# Patient Record
Sex: Female | Born: 1935 | Race: White | Hispanic: No | Smoking: Never smoker
Health system: Southern US, Community
[De-identification: ages and names within clinical notes are randomized; demographics above are authoritative.]

## PROBLEM LIST (undated history)

## (undated) DIAGNOSIS — K219 Gastro-esophageal reflux disease without esophagitis: Secondary | ICD-10-CM

## (undated) DIAGNOSIS — K922 Gastrointestinal hemorrhage, unspecified: Secondary | ICD-10-CM

## (undated) DIAGNOSIS — I10 Essential (primary) hypertension: Secondary | ICD-10-CM

## (undated) DIAGNOSIS — I471 Supraventricular tachycardia, unspecified: Secondary | ICD-10-CM

## (undated) DIAGNOSIS — N189 Chronic kidney disease, unspecified: Secondary | ICD-10-CM

## (undated) DIAGNOSIS — I509 Heart failure, unspecified: Secondary | ICD-10-CM

## (undated) DIAGNOSIS — I4891 Unspecified atrial fibrillation: Secondary | ICD-10-CM

## (undated) DIAGNOSIS — H3530 Unspecified macular degeneration: Secondary | ICD-10-CM

## (undated) DIAGNOSIS — D649 Anemia, unspecified: Secondary | ICD-10-CM

## (undated) DIAGNOSIS — E7800 Pure hypercholesterolemia, unspecified: Secondary | ICD-10-CM

## (undated) DIAGNOSIS — I499 Cardiac arrhythmia, unspecified: Secondary | ICD-10-CM

## (undated) DIAGNOSIS — E871 Hypo-osmolality and hyponatremia: Secondary | ICD-10-CM

## (undated) HISTORY — DX: Cardiac arrhythmia, unspecified: I49.9

## (undated) HISTORY — PX: VAGINAL HYSTERECTOMY: SHX2639

## (undated) HISTORY — PX: REPLACEMENT TOTAL KNEE BILATERAL: SUR1225

## (undated) HISTORY — DX: Supraventricular tachycardia, unspecified: I47.10

## (undated) HISTORY — DX: Anemia, unspecified: D64.9

## (undated) HISTORY — DX: Hypo-osmolality and hyponatremia: E87.1

## (undated) HISTORY — DX: Unspecified macular degeneration: H35.30

## (undated) HISTORY — DX: Supraventricular tachycardia: I47.1

## (undated) HISTORY — PX: ABDOMINAL HYSTERECTOMY: SHX81

## (undated) HISTORY — DX: Gastrointestinal hemorrhage, unspecified: K92.2

## (undated) HISTORY — PX: JOINT REPLACEMENT: SHX530

## (undated) HISTORY — DX: Gastro-esophageal reflux disease without esophagitis: K21.9

---

## 1999-01-26 ENCOUNTER — Ambulatory Visit (HOSPITAL_COMMUNITY): Admission: RE | Admit: 1999-01-26 | Discharge: 1999-01-26 | Payer: Self-pay | Admitting: Obstetrics & Gynecology

## 2004-09-28 ENCOUNTER — Ambulatory Visit: Payer: Self-pay | Admitting: Pain Medicine

## 2004-10-04 ENCOUNTER — Ambulatory Visit: Payer: Self-pay | Admitting: Pain Medicine

## 2004-11-03 ENCOUNTER — Other Ambulatory Visit: Payer: Self-pay

## 2004-11-03 ENCOUNTER — Emergency Department: Payer: Self-pay | Admitting: General Practice

## 2004-11-04 ENCOUNTER — Ambulatory Visit: Payer: Self-pay | Admitting: Pain Medicine

## 2004-11-10 ENCOUNTER — Ambulatory Visit: Payer: Self-pay | Admitting: Pain Medicine

## 2004-12-01 ENCOUNTER — Emergency Department: Payer: Self-pay | Admitting: Emergency Medicine

## 2004-12-16 ENCOUNTER — Ambulatory Visit: Payer: Self-pay | Admitting: Pain Medicine

## 2004-12-29 ENCOUNTER — Ambulatory Visit: Payer: Self-pay | Admitting: Pain Medicine

## 2005-01-27 ENCOUNTER — Ambulatory Visit: Payer: Self-pay | Admitting: Pain Medicine

## 2005-04-06 ENCOUNTER — Ambulatory Visit: Payer: Self-pay | Admitting: Pain Medicine

## 2005-05-19 ENCOUNTER — Ambulatory Visit: Payer: Self-pay | Admitting: Pain Medicine

## 2005-05-30 ENCOUNTER — Ambulatory Visit: Payer: Self-pay | Admitting: Pain Medicine

## 2005-06-16 ENCOUNTER — Ambulatory Visit: Payer: Self-pay | Admitting: Internal Medicine

## 2005-07-14 ENCOUNTER — Ambulatory Visit: Payer: Self-pay | Admitting: Pain Medicine

## 2005-08-10 ENCOUNTER — Ambulatory Visit: Payer: Self-pay | Admitting: Pain Medicine

## 2005-09-13 ENCOUNTER — Other Ambulatory Visit: Payer: Self-pay

## 2005-09-20 ENCOUNTER — Inpatient Hospital Stay: Payer: Self-pay | Admitting: Unknown Physician Specialty

## 2005-09-24 ENCOUNTER — Other Ambulatory Visit: Payer: Self-pay

## 2005-12-29 ENCOUNTER — Ambulatory Visit: Payer: Self-pay | Admitting: Pain Medicine

## 2006-01-02 ENCOUNTER — Ambulatory Visit: Payer: Self-pay | Admitting: Pain Medicine

## 2006-02-02 ENCOUNTER — Ambulatory Visit: Payer: Self-pay | Admitting: Pain Medicine

## 2006-02-13 ENCOUNTER — Ambulatory Visit: Payer: Self-pay | Admitting: Pain Medicine

## 2006-03-03 ENCOUNTER — Ambulatory Visit: Payer: Self-pay | Admitting: Internal Medicine

## 2006-03-21 ENCOUNTER — Ambulatory Visit: Payer: Self-pay | Admitting: Pain Medicine

## 2006-03-29 ENCOUNTER — Ambulatory Visit: Payer: Self-pay | Admitting: Gastroenterology

## 2006-04-14 ENCOUNTER — Ambulatory Visit: Payer: Self-pay | Admitting: Internal Medicine

## 2006-04-21 ENCOUNTER — Ambulatory Visit: Payer: Self-pay | Admitting: Internal Medicine

## 2006-05-09 ENCOUNTER — Ambulatory Visit: Payer: Self-pay | Admitting: Pain Medicine

## 2006-05-17 ENCOUNTER — Ambulatory Visit: Payer: Self-pay | Admitting: Pain Medicine

## 2006-05-26 ENCOUNTER — Ambulatory Visit: Payer: Self-pay | Admitting: Internal Medicine

## 2006-05-30 ENCOUNTER — Inpatient Hospital Stay: Payer: Self-pay | Admitting: Unknown Physician Specialty

## 2006-05-30 ENCOUNTER — Other Ambulatory Visit: Payer: Self-pay

## 2006-05-31 ENCOUNTER — Other Ambulatory Visit: Payer: Self-pay

## 2006-06-21 ENCOUNTER — Ambulatory Visit: Payer: Self-pay | Admitting: Internal Medicine

## 2006-09-04 ENCOUNTER — Ambulatory Visit: Payer: Self-pay | Admitting: Internal Medicine

## 2006-09-22 ENCOUNTER — Ambulatory Visit: Payer: Self-pay | Admitting: Internal Medicine

## 2006-10-21 ENCOUNTER — Ambulatory Visit: Payer: Self-pay | Admitting: Internal Medicine

## 2008-01-03 ENCOUNTER — Ambulatory Visit: Payer: Self-pay | Admitting: Internal Medicine

## 2009-02-16 ENCOUNTER — Ambulatory Visit: Payer: Self-pay | Admitting: Internal Medicine

## 2011-03-28 ENCOUNTER — Ambulatory Visit: Payer: Self-pay | Admitting: Internal Medicine

## 2011-08-21 ENCOUNTER — Inpatient Hospital Stay: Payer: Self-pay | Admitting: Specialist

## 2011-12-05 DIAGNOSIS — R609 Edema, unspecified: Secondary | ICD-10-CM | POA: Diagnosis not present

## 2011-12-05 DIAGNOSIS — R269 Unspecified abnormalities of gait and mobility: Secondary | ICD-10-CM | POA: Diagnosis not present

## 2011-12-05 DIAGNOSIS — I10 Essential (primary) hypertension: Secondary | ICD-10-CM | POA: Diagnosis not present

## 2011-12-29 DIAGNOSIS — H353290 Exudative age-related macular degeneration, unspecified eye, stage unspecified: Secondary | ICD-10-CM | POA: Diagnosis not present

## 2012-01-03 DIAGNOSIS — Z79899 Other long term (current) drug therapy: Secondary | ICD-10-CM | POA: Diagnosis not present

## 2012-01-10 DIAGNOSIS — R609 Edema, unspecified: Secondary | ICD-10-CM | POA: Diagnosis not present

## 2012-01-10 DIAGNOSIS — R262 Difficulty in walking, not elsewhere classified: Secondary | ICD-10-CM | POA: Diagnosis not present

## 2012-01-10 DIAGNOSIS — M129 Arthropathy, unspecified: Secondary | ICD-10-CM | POA: Diagnosis not present

## 2012-01-10 DIAGNOSIS — I10 Essential (primary) hypertension: Secondary | ICD-10-CM | POA: Diagnosis not present

## 2012-02-09 DIAGNOSIS — H2510 Age-related nuclear cataract, unspecified eye: Secondary | ICD-10-CM | POA: Diagnosis not present

## 2012-02-28 DIAGNOSIS — H353290 Exudative age-related macular degeneration, unspecified eye, stage unspecified: Secondary | ICD-10-CM | POA: Diagnosis not present

## 2012-03-08 ENCOUNTER — Ambulatory Visit: Payer: Self-pay | Admitting: Ophthalmology

## 2012-03-08 DIAGNOSIS — R0609 Other forms of dyspnea: Secondary | ICD-10-CM | POA: Diagnosis not present

## 2012-03-08 DIAGNOSIS — Z01812 Encounter for preprocedural laboratory examination: Secondary | ICD-10-CM | POA: Diagnosis not present

## 2012-03-08 DIAGNOSIS — I209 Angina pectoris, unspecified: Secondary | ICD-10-CM | POA: Diagnosis not present

## 2012-03-08 DIAGNOSIS — I10 Essential (primary) hypertension: Secondary | ICD-10-CM | POA: Diagnosis not present

## 2012-03-08 DIAGNOSIS — Z01810 Encounter for preprocedural cardiovascular examination: Secondary | ICD-10-CM | POA: Diagnosis not present

## 2012-03-08 DIAGNOSIS — R0989 Other specified symptoms and signs involving the circulatory and respiratory systems: Secondary | ICD-10-CM | POA: Diagnosis not present

## 2012-03-08 DIAGNOSIS — H2510 Age-related nuclear cataract, unspecified eye: Secondary | ICD-10-CM | POA: Diagnosis not present

## 2012-03-08 LAB — HEMOGLOBIN: HGB: 10.9 g/dL — ABNORMAL LOW (ref 12.0–16.0)

## 2012-03-12 DIAGNOSIS — D649 Anemia, unspecified: Secondary | ICD-10-CM | POA: Diagnosis not present

## 2012-03-20 ENCOUNTER — Ambulatory Visit: Payer: Self-pay | Admitting: Ophthalmology

## 2012-03-20 DIAGNOSIS — D649 Anemia, unspecified: Secondary | ICD-10-CM | POA: Diagnosis not present

## 2012-03-20 DIAGNOSIS — H269 Unspecified cataract: Secondary | ICD-10-CM | POA: Diagnosis not present

## 2012-03-20 DIAGNOSIS — I498 Other specified cardiac arrhythmias: Secondary | ICD-10-CM | POA: Diagnosis not present

## 2012-03-20 DIAGNOSIS — I10 Essential (primary) hypertension: Secondary | ICD-10-CM | POA: Diagnosis not present

## 2012-03-20 DIAGNOSIS — Z7982 Long term (current) use of aspirin: Secondary | ICD-10-CM | POA: Diagnosis not present

## 2012-03-20 DIAGNOSIS — E079 Disorder of thyroid, unspecified: Secondary | ICD-10-CM | POA: Diagnosis not present

## 2012-03-20 DIAGNOSIS — M069 Rheumatoid arthritis, unspecified: Secondary | ICD-10-CM | POA: Diagnosis not present

## 2012-03-20 DIAGNOSIS — H2510 Age-related nuclear cataract, unspecified eye: Secondary | ICD-10-CM | POA: Diagnosis not present

## 2012-03-20 DIAGNOSIS — M4120 Other idiopathic scoliosis, site unspecified: Secondary | ICD-10-CM | POA: Diagnosis not present

## 2012-03-20 DIAGNOSIS — M810 Age-related osteoporosis without current pathological fracture: Secondary | ICD-10-CM | POA: Diagnosis not present

## 2012-03-20 DIAGNOSIS — M549 Dorsalgia, unspecified: Secondary | ICD-10-CM | POA: Diagnosis not present

## 2012-03-20 DIAGNOSIS — R0609 Other forms of dyspnea: Secondary | ICD-10-CM | POA: Diagnosis not present

## 2012-03-20 DIAGNOSIS — K219 Gastro-esophageal reflux disease without esophagitis: Secondary | ICD-10-CM | POA: Diagnosis not present

## 2012-03-20 DIAGNOSIS — Z79899 Other long term (current) drug therapy: Secondary | ICD-10-CM | POA: Diagnosis not present

## 2012-03-20 DIAGNOSIS — Z96659 Presence of unspecified artificial knee joint: Secondary | ICD-10-CM | POA: Diagnosis not present

## 2012-03-20 DIAGNOSIS — H9190 Unspecified hearing loss, unspecified ear: Secondary | ICD-10-CM | POA: Diagnosis not present

## 2012-03-20 DIAGNOSIS — I209 Angina pectoris, unspecified: Secondary | ICD-10-CM | POA: Diagnosis not present

## 2012-05-07 DIAGNOSIS — H353290 Exudative age-related macular degeneration, unspecified eye, stage unspecified: Secondary | ICD-10-CM | POA: Diagnosis not present

## 2012-05-10 ENCOUNTER — Ambulatory Visit: Payer: Self-pay | Admitting: Ophthalmology

## 2012-05-10 DIAGNOSIS — Z01812 Encounter for preprocedural laboratory examination: Secondary | ICD-10-CM | POA: Diagnosis not present

## 2012-05-10 DIAGNOSIS — D649 Anemia, unspecified: Secondary | ICD-10-CM | POA: Diagnosis not present

## 2012-05-10 DIAGNOSIS — Z79899 Other long term (current) drug therapy: Secondary | ICD-10-CM | POA: Diagnosis not present

## 2012-05-10 DIAGNOSIS — H2510 Age-related nuclear cataract, unspecified eye: Secondary | ICD-10-CM | POA: Diagnosis not present

## 2012-05-10 LAB — HEMOGLOBIN: HGB: 11.3 g/dL — ABNORMAL LOW (ref 12.0–16.0)

## 2012-05-10 LAB — POTASSIUM: Potassium: 4 mmol/L (ref 3.5–5.1)

## 2012-05-29 ENCOUNTER — Ambulatory Visit: Payer: Self-pay | Admitting: Ophthalmology

## 2012-05-29 DIAGNOSIS — M069 Rheumatoid arthritis, unspecified: Secondary | ICD-10-CM | POA: Diagnosis not present

## 2012-05-29 DIAGNOSIS — M4120 Other idiopathic scoliosis, site unspecified: Secondary | ICD-10-CM | POA: Diagnosis not present

## 2012-05-29 DIAGNOSIS — I10 Essential (primary) hypertension: Secondary | ICD-10-CM | POA: Diagnosis not present

## 2012-05-29 DIAGNOSIS — M810 Age-related osteoporosis without current pathological fracture: Secondary | ICD-10-CM | POA: Diagnosis not present

## 2012-05-29 DIAGNOSIS — Z882 Allergy status to sulfonamides status: Secondary | ICD-10-CM | POA: Diagnosis not present

## 2012-05-29 DIAGNOSIS — H9190 Unspecified hearing loss, unspecified ear: Secondary | ICD-10-CM | POA: Diagnosis not present

## 2012-05-29 DIAGNOSIS — I209 Angina pectoris, unspecified: Secondary | ICD-10-CM | POA: Diagnosis not present

## 2012-05-29 DIAGNOSIS — J309 Allergic rhinitis, unspecified: Secondary | ICD-10-CM | POA: Diagnosis not present

## 2012-05-29 DIAGNOSIS — H269 Unspecified cataract: Secondary | ICD-10-CM | POA: Diagnosis not present

## 2012-05-29 DIAGNOSIS — K219 Gastro-esophageal reflux disease without esophagitis: Secondary | ICD-10-CM | POA: Diagnosis not present

## 2012-05-29 DIAGNOSIS — R0609 Other forms of dyspnea: Secondary | ICD-10-CM | POA: Diagnosis not present

## 2012-05-29 DIAGNOSIS — Z79899 Other long term (current) drug therapy: Secondary | ICD-10-CM | POA: Diagnosis not present

## 2012-05-29 DIAGNOSIS — Z96659 Presence of unspecified artificial knee joint: Secondary | ICD-10-CM | POA: Diagnosis not present

## 2012-05-29 DIAGNOSIS — Z880 Allergy status to penicillin: Secondary | ICD-10-CM | POA: Diagnosis not present

## 2012-05-29 DIAGNOSIS — H2510 Age-related nuclear cataract, unspecified eye: Secondary | ICD-10-CM | POA: Diagnosis not present

## 2012-06-19 DIAGNOSIS — Z0000 Encounter for general adult medical examination without abnormal findings: Secondary | ICD-10-CM | POA: Diagnosis not present

## 2012-06-22 DIAGNOSIS — IMO0001 Reserved for inherently not codable concepts without codable children: Secondary | ICD-10-CM | POA: Diagnosis not present

## 2012-06-22 DIAGNOSIS — M76899 Other specified enthesopathies of unspecified lower limb, excluding foot: Secondary | ICD-10-CM | POA: Diagnosis not present

## 2012-06-22 DIAGNOSIS — M810 Age-related osteoporosis without current pathological fracture: Secondary | ICD-10-CM | POA: Diagnosis not present

## 2012-06-22 DIAGNOSIS — M6281 Muscle weakness (generalized): Secondary | ICD-10-CM | POA: Diagnosis not present

## 2012-06-22 DIAGNOSIS — R262 Difficulty in walking, not elsewhere classified: Secondary | ICD-10-CM | POA: Diagnosis not present

## 2012-06-22 DIAGNOSIS — G609 Hereditary and idiopathic neuropathy, unspecified: Secondary | ICD-10-CM | POA: Diagnosis not present

## 2012-06-25 DIAGNOSIS — M810 Age-related osteoporosis without current pathological fracture: Secondary | ICD-10-CM | POA: Diagnosis not present

## 2012-06-25 DIAGNOSIS — M6281 Muscle weakness (generalized): Secondary | ICD-10-CM | POA: Diagnosis not present

## 2012-06-25 DIAGNOSIS — M76899 Other specified enthesopathies of unspecified lower limb, excluding foot: Secondary | ICD-10-CM | POA: Diagnosis not present

## 2012-06-25 DIAGNOSIS — IMO0001 Reserved for inherently not codable concepts without codable children: Secondary | ICD-10-CM | POA: Diagnosis not present

## 2012-06-25 DIAGNOSIS — G609 Hereditary and idiopathic neuropathy, unspecified: Secondary | ICD-10-CM | POA: Diagnosis not present

## 2012-06-25 DIAGNOSIS — R262 Difficulty in walking, not elsewhere classified: Secondary | ICD-10-CM | POA: Diagnosis not present

## 2012-06-27 DIAGNOSIS — R262 Difficulty in walking, not elsewhere classified: Secondary | ICD-10-CM | POA: Diagnosis not present

## 2012-06-27 DIAGNOSIS — M810 Age-related osteoporosis without current pathological fracture: Secondary | ICD-10-CM | POA: Diagnosis not present

## 2012-06-27 DIAGNOSIS — IMO0001 Reserved for inherently not codable concepts without codable children: Secondary | ICD-10-CM | POA: Diagnosis not present

## 2012-06-27 DIAGNOSIS — G609 Hereditary and idiopathic neuropathy, unspecified: Secondary | ICD-10-CM | POA: Diagnosis not present

## 2012-06-27 DIAGNOSIS — M76899 Other specified enthesopathies of unspecified lower limb, excluding foot: Secondary | ICD-10-CM | POA: Diagnosis not present

## 2012-06-27 DIAGNOSIS — M6281 Muscle weakness (generalized): Secondary | ICD-10-CM | POA: Diagnosis not present

## 2012-06-29 DIAGNOSIS — M810 Age-related osteoporosis without current pathological fracture: Secondary | ICD-10-CM | POA: Diagnosis not present

## 2012-06-29 DIAGNOSIS — M76899 Other specified enthesopathies of unspecified lower limb, excluding foot: Secondary | ICD-10-CM | POA: Diagnosis not present

## 2012-06-29 DIAGNOSIS — G609 Hereditary and idiopathic neuropathy, unspecified: Secondary | ICD-10-CM | POA: Diagnosis not present

## 2012-06-29 DIAGNOSIS — R262 Difficulty in walking, not elsewhere classified: Secondary | ICD-10-CM | POA: Diagnosis not present

## 2012-06-29 DIAGNOSIS — IMO0001 Reserved for inherently not codable concepts without codable children: Secondary | ICD-10-CM | POA: Diagnosis not present

## 2012-06-29 DIAGNOSIS — M6281 Muscle weakness (generalized): Secondary | ICD-10-CM | POA: Diagnosis not present

## 2012-07-02 DIAGNOSIS — G609 Hereditary and idiopathic neuropathy, unspecified: Secondary | ICD-10-CM | POA: Diagnosis not present

## 2012-07-02 DIAGNOSIS — M6281 Muscle weakness (generalized): Secondary | ICD-10-CM | POA: Diagnosis not present

## 2012-07-02 DIAGNOSIS — M810 Age-related osteoporosis without current pathological fracture: Secondary | ICD-10-CM | POA: Diagnosis not present

## 2012-07-02 DIAGNOSIS — M76899 Other specified enthesopathies of unspecified lower limb, excluding foot: Secondary | ICD-10-CM | POA: Diagnosis not present

## 2012-07-02 DIAGNOSIS — IMO0001 Reserved for inherently not codable concepts without codable children: Secondary | ICD-10-CM | POA: Diagnosis not present

## 2012-07-02 DIAGNOSIS — R262 Difficulty in walking, not elsewhere classified: Secondary | ICD-10-CM | POA: Diagnosis not present

## 2012-07-04 DIAGNOSIS — M76899 Other specified enthesopathies of unspecified lower limb, excluding foot: Secondary | ICD-10-CM | POA: Diagnosis not present

## 2012-07-04 DIAGNOSIS — IMO0001 Reserved for inherently not codable concepts without codable children: Secondary | ICD-10-CM | POA: Diagnosis not present

## 2012-07-04 DIAGNOSIS — M6281 Muscle weakness (generalized): Secondary | ICD-10-CM | POA: Diagnosis not present

## 2012-07-04 DIAGNOSIS — M810 Age-related osteoporosis without current pathological fracture: Secondary | ICD-10-CM | POA: Diagnosis not present

## 2012-07-04 DIAGNOSIS — R262 Difficulty in walking, not elsewhere classified: Secondary | ICD-10-CM | POA: Diagnosis not present

## 2012-07-04 DIAGNOSIS — G609 Hereditary and idiopathic neuropathy, unspecified: Secondary | ICD-10-CM | POA: Diagnosis not present

## 2012-07-10 DIAGNOSIS — H353290 Exudative age-related macular degeneration, unspecified eye, stage unspecified: Secondary | ICD-10-CM | POA: Diagnosis not present

## 2012-07-11 DIAGNOSIS — R262 Difficulty in walking, not elsewhere classified: Secondary | ICD-10-CM | POA: Diagnosis not present

## 2012-07-11 DIAGNOSIS — M76899 Other specified enthesopathies of unspecified lower limb, excluding foot: Secondary | ICD-10-CM | POA: Diagnosis not present

## 2012-07-11 DIAGNOSIS — IMO0001 Reserved for inherently not codable concepts without codable children: Secondary | ICD-10-CM | POA: Diagnosis not present

## 2012-07-11 DIAGNOSIS — M6281 Muscle weakness (generalized): Secondary | ICD-10-CM | POA: Diagnosis not present

## 2012-07-11 DIAGNOSIS — G609 Hereditary and idiopathic neuropathy, unspecified: Secondary | ICD-10-CM | POA: Diagnosis not present

## 2012-07-11 DIAGNOSIS — M810 Age-related osteoporosis without current pathological fracture: Secondary | ICD-10-CM | POA: Diagnosis not present

## 2012-07-23 DIAGNOSIS — IMO0001 Reserved for inherently not codable concepts without codable children: Secondary | ICD-10-CM | POA: Diagnosis not present

## 2012-07-23 DIAGNOSIS — G609 Hereditary and idiopathic neuropathy, unspecified: Secondary | ICD-10-CM | POA: Diagnosis not present

## 2012-07-23 DIAGNOSIS — R262 Difficulty in walking, not elsewhere classified: Secondary | ICD-10-CM | POA: Diagnosis not present

## 2012-07-23 DIAGNOSIS — M810 Age-related osteoporosis without current pathological fracture: Secondary | ICD-10-CM | POA: Diagnosis not present

## 2012-07-23 DIAGNOSIS — M76899 Other specified enthesopathies of unspecified lower limb, excluding foot: Secondary | ICD-10-CM | POA: Diagnosis not present

## 2012-07-23 DIAGNOSIS — M6281 Muscle weakness (generalized): Secondary | ICD-10-CM | POA: Diagnosis not present

## 2012-07-25 DIAGNOSIS — M810 Age-related osteoporosis without current pathological fracture: Secondary | ICD-10-CM | POA: Diagnosis not present

## 2012-07-25 DIAGNOSIS — IMO0001 Reserved for inherently not codable concepts without codable children: Secondary | ICD-10-CM | POA: Diagnosis not present

## 2012-07-25 DIAGNOSIS — G609 Hereditary and idiopathic neuropathy, unspecified: Secondary | ICD-10-CM | POA: Diagnosis not present

## 2012-07-25 DIAGNOSIS — I4891 Unspecified atrial fibrillation: Secondary | ICD-10-CM | POA: Diagnosis not present

## 2012-07-25 DIAGNOSIS — M76899 Other specified enthesopathies of unspecified lower limb, excluding foot: Secondary | ICD-10-CM | POA: Diagnosis not present

## 2012-07-25 DIAGNOSIS — M6281 Muscle weakness (generalized): Secondary | ICD-10-CM | POA: Diagnosis not present

## 2012-07-25 DIAGNOSIS — R262 Difficulty in walking, not elsewhere classified: Secondary | ICD-10-CM | POA: Diagnosis not present

## 2012-07-27 DIAGNOSIS — M810 Age-related osteoporosis without current pathological fracture: Secondary | ICD-10-CM | POA: Diagnosis not present

## 2012-07-27 DIAGNOSIS — R262 Difficulty in walking, not elsewhere classified: Secondary | ICD-10-CM | POA: Diagnosis not present

## 2012-07-27 DIAGNOSIS — IMO0001 Reserved for inherently not codable concepts without codable children: Secondary | ICD-10-CM | POA: Diagnosis not present

## 2012-07-27 DIAGNOSIS — M76899 Other specified enthesopathies of unspecified lower limb, excluding foot: Secondary | ICD-10-CM | POA: Diagnosis not present

## 2012-07-27 DIAGNOSIS — M6281 Muscle weakness (generalized): Secondary | ICD-10-CM | POA: Diagnosis not present

## 2012-07-27 DIAGNOSIS — G609 Hereditary and idiopathic neuropathy, unspecified: Secondary | ICD-10-CM | POA: Diagnosis not present

## 2012-08-08 DIAGNOSIS — Z23 Encounter for immunization: Secondary | ICD-10-CM | POA: Diagnosis not present

## 2012-09-19 DIAGNOSIS — R0989 Other specified symptoms and signs involving the circulatory and respiratory systems: Secondary | ICD-10-CM | POA: Diagnosis not present

## 2012-09-19 DIAGNOSIS — I10 Essential (primary) hypertension: Secondary | ICD-10-CM | POA: Diagnosis not present

## 2012-09-25 DIAGNOSIS — H353290 Exudative age-related macular degeneration, unspecified eye, stage unspecified: Secondary | ICD-10-CM | POA: Diagnosis not present

## 2012-10-03 DIAGNOSIS — H903 Sensorineural hearing loss, bilateral: Secondary | ICD-10-CM | POA: Diagnosis not present

## 2012-10-03 DIAGNOSIS — H6120 Impacted cerumen, unspecified ear: Secondary | ICD-10-CM | POA: Diagnosis not present

## 2012-10-23 DIAGNOSIS — Z79899 Other long term (current) drug therapy: Secondary | ICD-10-CM | POA: Diagnosis not present

## 2012-10-23 DIAGNOSIS — D649 Anemia, unspecified: Secondary | ICD-10-CM | POA: Diagnosis not present

## 2012-10-30 DIAGNOSIS — K219 Gastro-esophageal reflux disease without esophagitis: Secondary | ICD-10-CM | POA: Diagnosis not present

## 2012-10-30 DIAGNOSIS — N189 Chronic kidney disease, unspecified: Secondary | ICD-10-CM | POA: Diagnosis not present

## 2012-10-30 DIAGNOSIS — I10 Essential (primary) hypertension: Secondary | ICD-10-CM | POA: Diagnosis not present

## 2012-12-28 DIAGNOSIS — H353290 Exudative age-related macular degeneration, unspecified eye, stage unspecified: Secondary | ICD-10-CM | POA: Diagnosis not present

## 2012-12-31 DIAGNOSIS — I10 Essential (primary) hypertension: Secondary | ICD-10-CM | POA: Diagnosis not present

## 2013-02-05 ENCOUNTER — Inpatient Hospital Stay: Payer: Self-pay | Admitting: Internal Medicine

## 2013-02-05 DIAGNOSIS — M25569 Pain in unspecified knee: Secondary | ICD-10-CM | POA: Diagnosis not present

## 2013-02-05 DIAGNOSIS — S06330A Contusion and laceration of cerebrum, unspecified, without loss of consciousness, initial encounter: Secondary | ICD-10-CM | POA: Diagnosis not present

## 2013-02-05 DIAGNOSIS — Z9181 History of falling: Secondary | ICD-10-CM | POA: Diagnosis not present

## 2013-02-05 DIAGNOSIS — R002 Palpitations: Secondary | ICD-10-CM | POA: Diagnosis not present

## 2013-02-05 DIAGNOSIS — S0180XA Unspecified open wound of other part of head, initial encounter: Secondary | ICD-10-CM | POA: Diagnosis not present

## 2013-02-05 DIAGNOSIS — M4120 Other idiopathic scoliosis, site unspecified: Secondary | ICD-10-CM | POA: Diagnosis not present

## 2013-02-05 DIAGNOSIS — R6889 Other general symptoms and signs: Secondary | ICD-10-CM | POA: Diagnosis not present

## 2013-02-05 DIAGNOSIS — I10 Essential (primary) hypertension: Secondary | ICD-10-CM | POA: Diagnosis not present

## 2013-02-05 DIAGNOSIS — Z882 Allergy status to sulfonamides status: Secondary | ICD-10-CM | POA: Diagnosis not present

## 2013-02-05 DIAGNOSIS — E871 Hypo-osmolality and hyponatremia: Secondary | ICD-10-CM | POA: Diagnosis not present

## 2013-02-05 DIAGNOSIS — K219 Gastro-esophageal reflux disease without esophagitis: Secondary | ICD-10-CM | POA: Diagnosis present

## 2013-02-05 DIAGNOSIS — Z90710 Acquired absence of both cervix and uterus: Secondary | ICD-10-CM | POA: Diagnosis not present

## 2013-02-05 DIAGNOSIS — I498 Other specified cardiac arrhythmias: Secondary | ICD-10-CM | POA: Diagnosis not present

## 2013-02-05 DIAGNOSIS — S0633AA Contusion and laceration of cerebrum, unspecified, with loss of consciousness status unknown, initial encounter: Secondary | ICD-10-CM | POA: Diagnosis not present

## 2013-02-05 DIAGNOSIS — Z7982 Long term (current) use of aspirin: Secondary | ICD-10-CM | POA: Diagnosis not present

## 2013-02-05 DIAGNOSIS — Z885 Allergy status to narcotic agent status: Secondary | ICD-10-CM | POA: Diagnosis not present

## 2013-02-05 DIAGNOSIS — Z880 Allergy status to penicillin: Secondary | ICD-10-CM | POA: Diagnosis not present

## 2013-02-05 DIAGNOSIS — Z8249 Family history of ischemic heart disease and other diseases of the circulatory system: Secondary | ICD-10-CM | POA: Diagnosis not present

## 2013-02-05 DIAGNOSIS — Z888 Allergy status to other drugs, medicaments and biological substances status: Secondary | ICD-10-CM | POA: Diagnosis not present

## 2013-02-05 DIAGNOSIS — Z79899 Other long term (current) drug therapy: Secondary | ICD-10-CM | POA: Diagnosis not present

## 2013-02-05 DIAGNOSIS — Z96659 Presence of unspecified artificial knee joint: Secondary | ICD-10-CM | POA: Diagnosis not present

## 2013-02-05 DIAGNOSIS — M1990 Unspecified osteoarthritis, unspecified site: Secondary | ICD-10-CM | POA: Diagnosis present

## 2013-02-05 DIAGNOSIS — I495 Sick sinus syndrome: Secondary | ICD-10-CM | POA: Diagnosis not present

## 2013-02-05 LAB — CBC
HGB: 12.3 g/dL (ref 12.0–16.0)
MCV: 91 fL (ref 80–100)
RBC: 4.12 10*6/uL (ref 3.80–5.20)

## 2013-02-05 LAB — COMPREHENSIVE METABOLIC PANEL
Albumin: 4 g/dL (ref 3.4–5.0)
BUN: 25 mg/dL — ABNORMAL HIGH (ref 7–18)
Co2: 29 mmol/L (ref 21–32)
EGFR (African American): 43 — ABNORMAL LOW
Glucose: 91 mg/dL (ref 65–99)
SGOT(AST): 28 U/L (ref 15–37)
Total Protein: 7.8 g/dL (ref 6.4–8.2)

## 2013-02-05 LAB — PROTIME-INR
INR: 0.9
Prothrombin Time: 12.6 secs (ref 11.5–14.7)

## 2013-02-06 LAB — URINALYSIS, COMPLETE
Bilirubin,UR: NEGATIVE
Blood: NEGATIVE
Glucose,UR: NEGATIVE mg/dL (ref 0–75)
Ketone: NEGATIVE
Nitrite: NEGATIVE
Ph: 7 (ref 4.5–8.0)
Protein: 100
Specific Gravity: 1.014 (ref 1.003–1.030)
Squamous Epithelial: 1

## 2013-02-06 LAB — BASIC METABOLIC PANEL
Chloride: 101 mmol/L (ref 98–107)
EGFR (African American): 54 — ABNORMAL LOW
EGFR (Non-African Amer.): 46 — ABNORMAL LOW
Glucose: 95 mg/dL (ref 65–99)
Osmolality: 271 (ref 275–301)

## 2013-02-07 LAB — BASIC METABOLIC PANEL
Anion Gap: 9 (ref 7–16)
Calcium, Total: 8.5 mg/dL (ref 8.5–10.1)
Chloride: 98 mmol/L (ref 98–107)
Co2: 26 mmol/L (ref 21–32)
Creatinine: 1.17 mg/dL (ref 0.60–1.30)
EGFR (African American): 49 — ABNORMAL LOW
EGFR (Non-African Amer.): 42 — ABNORMAL LOW
Osmolality: 273 (ref 275–301)

## 2013-02-07 LAB — MAGNESIUM: Magnesium: 1.9 mg/dL

## 2013-02-08 LAB — BASIC METABOLIC PANEL
Anion Gap: 7 (ref 7–16)
BUN: 27 mg/dL — ABNORMAL HIGH (ref 7–18)
Calcium, Total: 8.3 mg/dL — ABNORMAL LOW (ref 8.5–10.1)
Chloride: 101 mmol/L (ref 98–107)
EGFR (Non-African Amer.): 48 — ABNORMAL LOW
Glucose: 79 mg/dL (ref 65–99)
Osmolality: 270 (ref 275–301)

## 2013-02-08 LAB — MAGNESIUM: Magnesium: 1.9 mg/dL

## 2013-02-09 LAB — URINE CULTURE

## 2013-02-13 DIAGNOSIS — I10 Essential (primary) hypertension: Secondary | ICD-10-CM | POA: Diagnosis not present

## 2013-02-19 DIAGNOSIS — Z79899 Other long term (current) drug therapy: Secondary | ICD-10-CM | POA: Diagnosis not present

## 2013-02-22 DIAGNOSIS — R799 Abnormal finding of blood chemistry, unspecified: Secondary | ICD-10-CM | POA: Diagnosis not present

## 2013-02-26 DIAGNOSIS — M542 Cervicalgia: Secondary | ICD-10-CM | POA: Diagnosis not present

## 2013-02-26 DIAGNOSIS — I10 Essential (primary) hypertension: Secondary | ICD-10-CM | POA: Diagnosis not present

## 2013-02-26 DIAGNOSIS — Z4802 Encounter for removal of sutures: Secondary | ICD-10-CM | POA: Diagnosis not present

## 2013-02-26 DIAGNOSIS — E871 Hypo-osmolality and hyponatremia: Secondary | ICD-10-CM | POA: Diagnosis not present

## 2013-03-14 DIAGNOSIS — Z79899 Other long term (current) drug therapy: Secondary | ICD-10-CM | POA: Diagnosis not present

## 2013-03-14 DIAGNOSIS — I10 Essential (primary) hypertension: Secondary | ICD-10-CM | POA: Diagnosis not present

## 2013-03-29 DIAGNOSIS — I10 Essential (primary) hypertension: Secondary | ICD-10-CM | POA: Diagnosis not present

## 2013-04-03 DIAGNOSIS — K219 Gastro-esophageal reflux disease without esophagitis: Secondary | ICD-10-CM | POA: Diagnosis not present

## 2013-04-03 DIAGNOSIS — I10 Essential (primary) hypertension: Secondary | ICD-10-CM | POA: Diagnosis not present

## 2013-04-09 DIAGNOSIS — I10 Essential (primary) hypertension: Secondary | ICD-10-CM | POA: Diagnosis not present

## 2013-06-12 DIAGNOSIS — R040 Epistaxis: Secondary | ICD-10-CM | POA: Diagnosis not present

## 2013-06-26 DIAGNOSIS — R040 Epistaxis: Secondary | ICD-10-CM | POA: Diagnosis not present

## 2013-07-02 DIAGNOSIS — Z79899 Other long term (current) drug therapy: Secondary | ICD-10-CM | POA: Diagnosis not present

## 2013-07-09 DIAGNOSIS — I10 Essential (primary) hypertension: Secondary | ICD-10-CM | POA: Diagnosis not present

## 2013-07-09 DIAGNOSIS — I471 Supraventricular tachycardia: Secondary | ICD-10-CM | POA: Diagnosis not present

## 2013-07-09 DIAGNOSIS — D649 Anemia, unspecified: Secondary | ICD-10-CM | POA: Diagnosis not present

## 2013-08-12 DIAGNOSIS — I10 Essential (primary) hypertension: Secondary | ICD-10-CM | POA: Diagnosis not present

## 2013-08-12 DIAGNOSIS — I209 Angina pectoris, unspecified: Secondary | ICD-10-CM | POA: Diagnosis not present

## 2013-08-19 DIAGNOSIS — Z23 Encounter for immunization: Secondary | ICD-10-CM | POA: Diagnosis not present

## 2013-08-20 DIAGNOSIS — Z23 Encounter for immunization: Secondary | ICD-10-CM | POA: Diagnosis not present

## 2013-10-31 DIAGNOSIS — D649 Anemia, unspecified: Secondary | ICD-10-CM | POA: Diagnosis not present

## 2013-10-31 DIAGNOSIS — Z136 Encounter for screening for cardiovascular disorders: Secondary | ICD-10-CM | POA: Diagnosis not present

## 2013-10-31 DIAGNOSIS — Z79899 Other long term (current) drug therapy: Secondary | ICD-10-CM | POA: Diagnosis not present

## 2013-11-07 DIAGNOSIS — I10 Essential (primary) hypertension: Secondary | ICD-10-CM | POA: Diagnosis not present

## 2013-11-07 DIAGNOSIS — D649 Anemia, unspecified: Secondary | ICD-10-CM | POA: Diagnosis not present

## 2013-12-26 DIAGNOSIS — J9819 Other pulmonary collapse: Secondary | ICD-10-CM | POA: Diagnosis not present

## 2013-12-26 DIAGNOSIS — R059 Cough, unspecified: Secondary | ICD-10-CM | POA: Diagnosis not present

## 2013-12-26 DIAGNOSIS — J209 Acute bronchitis, unspecified: Secondary | ICD-10-CM | POA: Diagnosis not present

## 2013-12-26 DIAGNOSIS — R0602 Shortness of breath: Secondary | ICD-10-CM | POA: Diagnosis not present

## 2013-12-26 DIAGNOSIS — R05 Cough: Secondary | ICD-10-CM | POA: Diagnosis not present

## 2013-12-26 DIAGNOSIS — I10 Essential (primary) hypertension: Secondary | ICD-10-CM | POA: Diagnosis not present

## 2013-12-29 ENCOUNTER — Inpatient Hospital Stay: Payer: Self-pay | Admitting: Family Medicine

## 2013-12-29 DIAGNOSIS — Z880 Allergy status to penicillin: Secondary | ICD-10-CM | POA: Diagnosis not present

## 2013-12-29 DIAGNOSIS — E861 Hypovolemia: Secondary | ICD-10-CM | POA: Diagnosis present

## 2013-12-29 DIAGNOSIS — K625 Hemorrhage of anus and rectum: Secondary | ICD-10-CM | POA: Diagnosis not present

## 2013-12-29 DIAGNOSIS — M1990 Unspecified osteoarthritis, unspecified site: Secondary | ICD-10-CM | POA: Diagnosis present

## 2013-12-29 DIAGNOSIS — N19 Unspecified kidney failure: Secondary | ICD-10-CM | POA: Diagnosis not present

## 2013-12-29 DIAGNOSIS — K219 Gastro-esophageal reflux disease without esophagitis: Secondary | ICD-10-CM | POA: Diagnosis present

## 2013-12-29 DIAGNOSIS — N390 Urinary tract infection, site not specified: Secondary | ICD-10-CM | POA: Diagnosis not present

## 2013-12-29 DIAGNOSIS — N179 Acute kidney failure, unspecified: Secondary | ICD-10-CM | POA: Diagnosis not present

## 2013-12-29 DIAGNOSIS — Z90710 Acquired absence of both cervix and uterus: Secondary | ICD-10-CM | POA: Diagnosis not present

## 2013-12-29 DIAGNOSIS — Z881 Allergy status to other antibiotic agents status: Secondary | ICD-10-CM | POA: Diagnosis not present

## 2013-12-29 DIAGNOSIS — K5730 Diverticulosis of large intestine without perforation or abscess without bleeding: Secondary | ICD-10-CM | POA: Diagnosis not present

## 2013-12-29 DIAGNOSIS — Z7982 Long term (current) use of aspirin: Secondary | ICD-10-CM | POA: Diagnosis not present

## 2013-12-29 DIAGNOSIS — R930 Abnormal findings on diagnostic imaging of skull and head, not elsewhere classified: Secondary | ICD-10-CM | POA: Diagnosis not present

## 2013-12-29 DIAGNOSIS — R141 Gas pain: Secondary | ICD-10-CM | POA: Diagnosis not present

## 2013-12-29 DIAGNOSIS — M4120 Other idiopathic scoliosis, site unspecified: Secondary | ICD-10-CM | POA: Diagnosis present

## 2013-12-29 DIAGNOSIS — E871 Hypo-osmolality and hyponatremia: Secondary | ICD-10-CM | POA: Diagnosis not present

## 2013-12-29 DIAGNOSIS — Z96659 Presence of unspecified artificial knee joint: Secondary | ICD-10-CM | POA: Diagnosis not present

## 2013-12-29 DIAGNOSIS — K5731 Diverticulosis of large intestine without perforation or abscess with bleeding: Secondary | ICD-10-CM | POA: Diagnosis not present

## 2013-12-29 DIAGNOSIS — R1115 Cyclical vomiting syndrome unrelated to migraine: Secondary | ICD-10-CM | POA: Diagnosis not present

## 2013-12-29 DIAGNOSIS — R0789 Other chest pain: Secondary | ICD-10-CM | POA: Diagnosis not present

## 2013-12-29 DIAGNOSIS — R52 Pain, unspecified: Secondary | ICD-10-CM | POA: Diagnosis not present

## 2013-12-29 DIAGNOSIS — R5381 Other malaise: Secondary | ICD-10-CM | POA: Diagnosis not present

## 2013-12-29 DIAGNOSIS — K648 Other hemorrhoids: Secondary | ICD-10-CM | POA: Diagnosis not present

## 2013-12-29 DIAGNOSIS — R079 Chest pain, unspecified: Secondary | ICD-10-CM | POA: Diagnosis not present

## 2013-12-29 DIAGNOSIS — J9819 Other pulmonary collapse: Secondary | ICD-10-CM | POA: Diagnosis not present

## 2013-12-29 DIAGNOSIS — F0390 Unspecified dementia without behavioral disturbance: Secondary | ICD-10-CM | POA: Diagnosis present

## 2013-12-29 DIAGNOSIS — K922 Gastrointestinal hemorrhage, unspecified: Secondary | ICD-10-CM | POA: Diagnosis not present

## 2013-12-29 DIAGNOSIS — R5383 Other fatigue: Secondary | ICD-10-CM | POA: Diagnosis not present

## 2013-12-29 DIAGNOSIS — K5900 Constipation, unspecified: Secondary | ICD-10-CM | POA: Diagnosis not present

## 2013-12-29 DIAGNOSIS — D649 Anemia, unspecified: Secondary | ICD-10-CM | POA: Diagnosis not present

## 2013-12-29 DIAGNOSIS — E876 Hypokalemia: Secondary | ICD-10-CM | POA: Diagnosis not present

## 2013-12-29 DIAGNOSIS — R112 Nausea with vomiting, unspecified: Secondary | ICD-10-CM | POA: Diagnosis not present

## 2013-12-29 DIAGNOSIS — I471 Supraventricular tachycardia: Secondary | ICD-10-CM | POA: Diagnosis present

## 2013-12-29 DIAGNOSIS — R197 Diarrhea, unspecified: Secondary | ICD-10-CM | POA: Diagnosis not present

## 2013-12-29 DIAGNOSIS — Z79899 Other long term (current) drug therapy: Secondary | ICD-10-CM | POA: Diagnosis not present

## 2013-12-29 DIAGNOSIS — R072 Precordial pain: Secondary | ICD-10-CM | POA: Diagnosis not present

## 2013-12-29 DIAGNOSIS — I150 Renovascular hypertension: Secondary | ICD-10-CM | POA: Diagnosis present

## 2013-12-29 DIAGNOSIS — Z66 Do not resuscitate: Secondary | ICD-10-CM | POA: Diagnosis present

## 2013-12-29 DIAGNOSIS — D62 Acute posthemorrhagic anemia: Secondary | ICD-10-CM | POA: Diagnosis present

## 2013-12-29 DIAGNOSIS — R143 Flatulence: Secondary | ICD-10-CM | POA: Diagnosis not present

## 2013-12-29 DIAGNOSIS — I10 Essential (primary) hypertension: Secondary | ICD-10-CM | POA: Diagnosis not present

## 2013-12-29 LAB — COMPREHENSIVE METABOLIC PANEL
ALT: 28 U/L (ref 12–78)
ANION GAP: 10 (ref 7–16)
AST: 52 U/L — AB (ref 15–37)
Albumin: 3.2 g/dL — ABNORMAL LOW (ref 3.4–5.0)
Alkaline Phosphatase: 68 U/L
BUN: 12 mg/dL (ref 7–18)
Bilirubin,Total: 0.8 mg/dL (ref 0.2–1.0)
CALCIUM: 8 mg/dL — AB (ref 8.5–10.1)
CO2: 24 mmol/L (ref 21–32)
CREATININE: 0.66 mg/dL (ref 0.60–1.30)
Chloride: 78 mmol/L — ABNORMAL LOW (ref 98–107)
Glucose: 147 mg/dL — ABNORMAL HIGH (ref 65–99)
Osmolality: 230 (ref 275–301)
Potassium: 3.4 mmol/L — ABNORMAL LOW (ref 3.5–5.1)
SODIUM: 112 mmol/L — AB (ref 136–145)
Total Protein: 6.6 g/dL (ref 6.4–8.2)

## 2013-12-29 LAB — CBC
HCT: 35.2 % (ref 35.0–47.0)
HGB: 12.2 g/dL (ref 12.0–16.0)
MCH: 29.7 pg (ref 26.0–34.0)
MCHC: 34.7 g/dL (ref 32.0–36.0)
MCV: 86 fL (ref 80–100)
Platelet: 242 10*3/uL (ref 150–440)
RBC: 4.11 10*6/uL (ref 3.80–5.20)
RDW: 12.6 % (ref 11.5–14.5)
WBC: 4.3 10*3/uL (ref 3.6–11.0)

## 2013-12-29 LAB — TROPONIN I: Troponin-I: <0.02 ng/mL

## 2013-12-30 LAB — URINALYSIS, COMPLETE
Bilirubin,UR: NEGATIVE
Blood: NEGATIVE
GLUCOSE, UR: NEGATIVE mg/dL (ref 0–75)
Nitrite: NEGATIVE
PH: 7 (ref 4.5–8.0)
Protein: >=500
SPECIFIC GRAVITY: 1.007 (ref 1.003–1.030)

## 2013-12-30 LAB — BASIC METABOLIC PANEL
Anion Gap: 8 (ref 7–16)
Anion Gap: 5 — ABNORMAL LOW (ref 7–16)
BUN: 14 mg/dL (ref 7–18)
BUN: 14 mg/dL (ref 7–18)
CHLORIDE: 79 mmol/L — AB (ref 98–107)
CO2: 27 mmol/L (ref 21–32)
CREATININE: 0.96 mg/dL (ref 0.60–1.30)
CREATININE: 1.22 mg/dL (ref 0.60–1.30)
Calcium, Total: 8 mg/dL — ABNORMAL LOW (ref 8.5–10.1)
Calcium, Total: 8.3 mg/dL — ABNORMAL LOW (ref 8.5–10.1)
Chloride: 86 mmol/L — ABNORMAL LOW (ref 98–107)
Co2: 28 mmol/L (ref 21–32)
EGFR (African American): >60
EGFR (Non-African Amer.): 53 — ABNORMAL LOW
GFR CALC AF AMER: 46 — AB
GFR CALC NON AF AMER: 40 — AB
GLUCOSE: 108 mg/dL — AB (ref 65–99)
GLUCOSE: 79 mg/dL (ref 65–99)
Osmolality: 232 (ref 275–301)
Osmolality: 240 (ref 275–301)
POTASSIUM: 3.5 mmol/L (ref 3.5–5.1)
Potassium: 2.8 mmol/L — ABNORMAL LOW (ref 3.5–5.1)
SODIUM: 119 mmol/L — AB (ref 136–145)
Sodium: 114 mmol/L — CL (ref 136–145)

## 2013-12-30 LAB — CBC WITH DIFFERENTIAL/PLATELET
BASOS PCT: 0.1 %
Basophil #: 0 10*3/uL (ref 0.0–0.1)
Eosinophil #: 0 10*3/uL (ref 0.0–0.7)
Eosinophil %: 1 %
HCT: 29.8 % — ABNORMAL LOW (ref 35.0–47.0)
HGB: 10.3 g/dL — ABNORMAL LOW (ref 12.0–16.0)
LYMPHS PCT: 13.1 %
Lymphocyte #: 0.4 10*3/uL — ABNORMAL LOW (ref 1.0–3.6)
MCH: 29.7 pg (ref 26.0–34.0)
MCHC: 34.6 g/dL (ref 32.0–36.0)
MCV: 86 fL (ref 80–100)
MONO ABS: 0.4 x10 3/mm (ref 0.2–0.9)
Monocyte %: 12.1 %
NEUTROS ABS: 2.2 10*3/uL (ref 1.4–6.5)
Neutrophil %: 73.7 %
Platelet: 177 10*3/uL (ref 150–440)
RBC: 3.47 10*6/uL — AB (ref 3.80–5.20)
RDW: 12.3 % (ref 11.5–14.5)
WBC: 3 10*3/uL — AB (ref 3.6–11.0)

## 2013-12-30 LAB — SODIUM
Sodium: 117 mmol/L — CL (ref 136–145)
Sodium: 119 mmol/L — CL (ref 136–145)

## 2013-12-30 LAB — CK TOTAL AND CKMB (NOT AT ARMC)
CK, Total: 208 U/L — ABNORMAL HIGH
CK, Total: 174 U/L
CK-MB: 5.5 ng/mL — ABNORMAL HIGH (ref 0.5–3.6)
CK-MB: 4.9 ng/mL — ABNORMAL HIGH (ref 0.5–3.6)

## 2013-12-30 LAB — TROPONIN I
Troponin-I: 0.02 ng/mL
Troponin-I: 0.03 ng/mL

## 2013-12-31 LAB — CBC WITH DIFFERENTIAL/PLATELET
Basophil #: 0 10*3/uL (ref 0.0–0.1)
Basophil %: 0.3 %
EOS ABS: 0.1 10*3/uL (ref 0.0–0.7)
EOS PCT: 1.7 %
HCT: 27.7 % — ABNORMAL LOW (ref 35.0–47.0)
HGB: 10 g/dL — ABNORMAL LOW (ref 12.0–16.0)
Lymphocyte #: 0.4 10*3/uL — ABNORMAL LOW (ref 1.0–3.6)
Lymphocyte %: 9.9 %
MCH: 31.2 pg (ref 26.0–34.0)
MCHC: 36 g/dL (ref 32.0–36.0)
MCV: 87 fL (ref 80–100)
Monocyte #: 0.6 x10 3/mm (ref 0.2–0.9)
Monocyte %: 14.1 %
NEUTROS ABS: 2.9 10*3/uL (ref 1.4–6.5)
Neutrophil %: 74 %
Platelet: 164 10*3/uL (ref 150–440)
RBC: 3.19 10*6/uL — ABNORMAL LOW (ref 3.80–5.20)
RDW: 12.6 % (ref 11.5–14.5)
WBC: 3.9 10*3/uL (ref 3.6–11.0)

## 2013-12-31 LAB — BASIC METABOLIC PANEL
ANION GAP: 7 (ref 7–16)
Anion Gap: 5 — ABNORMAL LOW (ref 7–16)
BUN: 14 mg/dL (ref 7–18)
BUN: 14 mg/dL (ref 7–18)
CALCIUM: 8 mg/dL — AB (ref 8.5–10.1)
CALCIUM: 7.9 mg/dL — AB (ref 8.5–10.1)
CHLORIDE: 91 mmol/L — AB (ref 98–107)
CHLORIDE: 92 mmol/L — AB (ref 98–107)
CREATININE: 1.2 mg/dL (ref 0.60–1.30)
Co2: 25 mmol/L (ref 21–32)
Co2: 26 mmol/L (ref 21–32)
Creatinine: 1.18 mg/dL (ref 0.60–1.30)
EGFR (African American): 47 — ABNORMAL LOW
EGFR (African American): 48 — ABNORMAL LOW
EGFR (Non-African Amer.): 41 — ABNORMAL LOW
GFR CALC NON AF AMER: 41 — AB
Glucose: 86 mg/dL (ref 65–99)
Glucose: 107 mg/dL — ABNORMAL HIGH (ref 65–99)
OSMOLALITY: 248 (ref 275–301)
Osmolality: 249 (ref 275–301)
POTASSIUM: 3.5 mmol/L (ref 3.5–5.1)
Potassium: 3.7 mmol/L (ref 3.5–5.1)
SODIUM: 123 mmol/L — AB (ref 136–145)
Sodium: 123 mmol/L — ABNORMAL LOW (ref 136–145)

## 2013-12-31 LAB — LIPID PANEL
Cholesterol: 151 mg/dL (ref 0–200)
HDL: 44 mg/dL (ref 40–60)
LDL CHOLESTEROL, CALC: 88 mg/dL (ref 0–100)
Triglycerides: 93 mg/dL (ref 0–200)
VLDL CHOLESTEROL, CALC: 19 mg/dL (ref 5–40)

## 2013-12-31 LAB — URINE CULTURE

## 2013-12-31 LAB — TSH: Thyroid Stimulating Horm: 0.892 u[IU]/mL

## 2013-12-31 LAB — MAGNESIUM: Magnesium: 1.3 mg/dL — ABNORMAL LOW

## 2014-01-01 LAB — BASIC METABOLIC PANEL
Anion Gap: 8 (ref 7–16)
BUN: 14 mg/dL (ref 7–18)
CHLORIDE: 95 mmol/L — AB (ref 98–107)
CO2: 24 mmol/L (ref 21–32)
CREATININE: 1.05 mg/dL (ref 0.60–1.30)
Calcium, Total: 7.7 mg/dL — ABNORMAL LOW (ref 8.5–10.1)
EGFR (African American): 55 — ABNORMAL LOW
GFR CALC NON AF AMER: 48 — AB
GLUCOSE: 90 mg/dL (ref 65–99)
Osmolality: 255 (ref 275–301)
Potassium: 3.9 mmol/L (ref 3.5–5.1)
SODIUM: 127 mmol/L — AB (ref 136–145)

## 2014-01-01 LAB — CHLORIDE, URINE, RANDOM: Chloride, Urine Random: 79 mmol/L (ref 55–125)

## 2014-01-01 LAB — HEMOGLOBIN
HGB: 10.3 g/dL — ABNORMAL LOW (ref 12.0–16.0)
HGB: 9 g/dL — ABNORMAL LOW (ref 12.0–16.0)

## 2014-01-01 LAB — SODIUM, URINE, RANDOM: Sodium, Urine Random: 58 mmol/L (ref 20–110)

## 2014-01-01 LAB — OSMOLALITY, URINE: OSMOLALITY: 427 mosm/kg

## 2014-01-01 LAB — POTASSIUM, URINE RANDOM: POTASSIUM, URINE RANDOM: 34 mmol/L — AB (ref 55–125)

## 2014-01-02 LAB — BASIC METABOLIC PANEL
Anion Gap: 7 (ref 7–16)
BUN: 19 mg/dL — ABNORMAL HIGH (ref 7–18)
Calcium, Total: 7.9 mg/dL — ABNORMAL LOW (ref 8.5–10.1)
Chloride: 95 mmol/L — ABNORMAL LOW (ref 98–107)
Co2: 25 mmol/L (ref 21–32)
Creatinine: 1.09 mg/dL (ref 0.60–1.30)
GFR CALC AF AMER: 53 — AB
GFR CALC NON AF AMER: 46 — AB
Glucose: 93 mg/dL (ref 65–99)
OSMOLALITY: 257 (ref 275–301)
POTASSIUM: 3.9 mmol/L (ref 3.5–5.1)
SODIUM: 127 mmol/L — AB (ref 136–145)

## 2014-01-02 LAB — CBC WITH DIFFERENTIAL/PLATELET
BASOS PCT: 0.7 %
Basophil #: 0 10*3/uL (ref 0.0–0.1)
Eosinophil #: 0.1 10*3/uL (ref 0.0–0.7)
Eosinophil %: 2 %
HCT: 25 % — ABNORMAL LOW (ref 35.0–47.0)
HGB: 8.7 g/dL — AB (ref 12.0–16.0)
Lymphocyte #: 0.5 10*3/uL — ABNORMAL LOW (ref 1.0–3.6)
Lymphocyte %: 10.6 %
MCH: 31.1 pg (ref 26.0–34.0)
MCHC: 35 g/dL (ref 32.0–36.0)
MCV: 89 fL (ref 80–100)
MONOS PCT: 14 %
Monocyte #: 0.7 x10 3/mm (ref 0.2–0.9)
NEUTROS PCT: 72.7 %
Neutrophil #: 3.5 10*3/uL (ref 1.4–6.5)
Platelet: 176 10*3/uL (ref 150–440)
RBC: 2.81 10*6/uL — AB (ref 3.80–5.20)
RDW: 13 % (ref 11.5–14.5)
WBC: 4.8 10*3/uL (ref 3.6–11.0)

## 2014-01-03 LAB — CBC WITH DIFFERENTIAL/PLATELET
BASOS PCT: 0.9 %
Basophil #: 0 10*3/uL (ref 0.0–0.1)
Eosinophil #: 0.2 10*3/uL (ref 0.0–0.7)
Eosinophil %: 3.8 %
HCT: 26.9 % — AB (ref 35.0–47.0)
HGB: 9.3 g/dL — AB (ref 12.0–16.0)
Lymphocyte #: 0.5 10*3/uL — ABNORMAL LOW (ref 1.0–3.6)
Lymphocyte %: 10.9 %
MCH: 30.4 pg (ref 26.0–34.0)
MCHC: 34.5 g/dL (ref 32.0–36.0)
MCV: 88 fL (ref 80–100)
Monocyte #: 0.7 x10 3/mm (ref 0.2–0.9)
Monocyte %: 15.3 %
NEUTROS PCT: 69.1 %
Neutrophil #: 3.2 10*3/uL (ref 1.4–6.5)
Platelet: 194 10*3/uL (ref 150–440)
RBC: 3.06 10*6/uL — ABNORMAL LOW (ref 3.80–5.20)
RDW: 13 % (ref 11.5–14.5)
WBC: 4.7 10*3/uL (ref 3.6–11.0)

## 2014-01-03 LAB — COMPREHENSIVE METABOLIC PANEL
ALBUMIN: 2.6 g/dL — AB (ref 3.4–5.0)
ALT: 16 U/L (ref 12–78)
Alkaline Phosphatase: 60 U/L
Anion Gap: 10 (ref 7–16)
BUN: 15 mg/dL (ref 7–18)
Bilirubin,Total: 0.8 mg/dL (ref 0.2–1.0)
CALCIUM: 8.3 mg/dL — AB (ref 8.5–10.1)
Chloride: 94 mmol/L — ABNORMAL LOW (ref 98–107)
Co2: 28 mmol/L (ref 21–32)
Creatinine: 1.07 mg/dL (ref 0.60–1.30)
EGFR (African American): 54 — ABNORMAL LOW
GFR CALC NON AF AMER: 47 — AB
Glucose: 96 mg/dL (ref 65–99)
Osmolality: 265 (ref 275–301)
POTASSIUM: 3.8 mmol/L (ref 3.5–5.1)
SGOT(AST): 23 U/L (ref 15–37)
Sodium: 132 mmol/L — ABNORMAL LOW (ref 136–145)
TOTAL PROTEIN: 5.5 g/dL — AB (ref 6.4–8.2)

## 2014-01-04 LAB — BASIC METABOLIC PANEL
ANION GAP: 4 — AB (ref 7–16)
BUN: 17 mg/dL (ref 7–18)
Calcium, Total: 8.3 mg/dL — ABNORMAL LOW (ref 8.5–10.1)
Chloride: 96 mmol/L — ABNORMAL LOW (ref 98–107)
Co2: 26 mmol/L (ref 21–32)
Creatinine: 1.12 mg/dL (ref 0.60–1.30)
EGFR (African American): 51 — ABNORMAL LOW
EGFR (Non-African Amer.): 44 — ABNORMAL LOW
Glucose: 106 mg/dL — ABNORMAL HIGH (ref 65–99)
Osmolality: 255 (ref 275–301)
POTASSIUM: 3.3 mmol/L — AB (ref 3.5–5.1)
Sodium: 126 mmol/L — ABNORMAL LOW (ref 136–145)

## 2014-01-04 LAB — CBC WITH DIFFERENTIAL/PLATELET
BASOS ABS: 0 10*3/uL (ref 0.0–0.1)
Basophil %: 0.6 %
EOS PCT: 1.8 %
Eosinophil #: 0.1 10*3/uL (ref 0.0–0.7)
HCT: 23.7 % — ABNORMAL LOW (ref 35.0–47.0)
HGB: 8.3 g/dL — ABNORMAL LOW (ref 12.0–16.0)
LYMPHS PCT: 8.7 %
Lymphocyte #: 0.5 10*3/uL — ABNORMAL LOW (ref 1.0–3.6)
MCH: 30.6 pg (ref 26.0–34.0)
MCHC: 34.9 g/dL (ref 32.0–36.0)
MCV: 88 fL (ref 80–100)
MONO ABS: 0.7 x10 3/mm (ref 0.2–0.9)
Monocyte %: 11.8 %
NEUTROS PCT: 77.1 %
Neutrophil #: 4.5 10*3/uL (ref 1.4–6.5)
Platelet: 198 10*3/uL (ref 150–440)
RBC: 2.71 10*6/uL — AB (ref 3.80–5.20)
RDW: 12.8 % (ref 11.5–14.5)
WBC: 5.9 10*3/uL (ref 3.6–11.0)

## 2014-01-05 LAB — CBC WITH DIFFERENTIAL/PLATELET
BASOS PCT: 0.7 %
Basophil #: 0 10*3/uL (ref 0.0–0.1)
EOS ABS: 0.2 10*3/uL (ref 0.0–0.7)
Eosinophil %: 2.6 %
HCT: 22.7 % — ABNORMAL LOW (ref 35.0–47.0)
HGB: 7.9 g/dL — ABNORMAL LOW (ref 12.0–16.0)
Lymphocyte #: 0.4 10*3/uL — ABNORMAL LOW (ref 1.0–3.6)
Lymphocyte %: 6.9 %
MCH: 30.2 pg (ref 26.0–34.0)
MCHC: 34.7 g/dL (ref 32.0–36.0)
MCV: 87 fL (ref 80–100)
Monocyte #: 0.7 x10 3/mm (ref 0.2–0.9)
Monocyte %: 12 %
Neutrophil #: 4.5 10*3/uL (ref 1.4–6.5)
Neutrophil %: 77.8 %
Platelet: 194 10*3/uL (ref 150–440)
RBC: 2.61 10*6/uL — ABNORMAL LOW (ref 3.80–5.20)
RDW: 12.5 % (ref 11.5–14.5)
WBC: 5.8 10*3/uL (ref 3.6–11.0)

## 2014-01-05 LAB — BASIC METABOLIC PANEL
ANION GAP: 6 — AB (ref 7–16)
Anion Gap: 9 (ref 7–16)
BUN: 19 mg/dL — AB (ref 7–18)
BUN: 18 mg/dL (ref 7–18)
CO2: 25 mmol/L (ref 21–32)
CO2: 23 mmol/L (ref 21–32)
CREATININE: 1.13 mg/dL (ref 0.60–1.30)
Calcium, Total: 8.4 mg/dL — ABNORMAL LOW (ref 8.5–10.1)
Calcium, Total: 8.1 mg/dL — ABNORMAL LOW (ref 8.5–10.1)
Chloride: 95 mmol/L — ABNORMAL LOW (ref 98–107)
Chloride: 94 mmol/L — ABNORMAL LOW (ref 98–107)
Creatinine: 1.21 mg/dL (ref 0.60–1.30)
EGFR (African American): 51 — ABNORMAL LOW
EGFR (African American): 47 — ABNORMAL LOW
GFR CALC NON AF AMER: 44 — AB
GFR CALC NON AF AMER: 40 — AB
Glucose: 104 mg/dL — ABNORMAL HIGH (ref 65–99)
Glucose: 132 mg/dL — ABNORMAL HIGH (ref 65–99)
Osmolality: 256 (ref 275–301)
Osmolality: 257 (ref 275–301)
Potassium: 3.3 mmol/L — ABNORMAL LOW (ref 3.5–5.1)
Potassium: 3.5 mmol/L (ref 3.5–5.1)
SODIUM: 126 mmol/L — AB (ref 136–145)
Sodium: 126 mmol/L — ABNORMAL LOW (ref 136–145)

## 2014-01-06 LAB — BASIC METABOLIC PANEL
Anion Gap: 6 — ABNORMAL LOW (ref 7–16)
BUN: 19 mg/dL — ABNORMAL HIGH (ref 7–18)
CHLORIDE: 93 mmol/L — AB (ref 98–107)
Calcium, Total: 7.4 mg/dL — ABNORMAL LOW (ref 8.5–10.1)
Co2: 26 mmol/L (ref 21–32)
Creatinine: 1.17 mg/dL (ref 0.60–1.30)
EGFR (African American): 49 — ABNORMAL LOW
EGFR (Non-African Amer.): 42 — ABNORMAL LOW
GLUCOSE: 101 mg/dL — AB (ref 65–99)
Osmolality: 254 (ref 275–301)
Potassium: 3.4 mmol/L — ABNORMAL LOW (ref 3.5–5.1)
SODIUM: 125 mmol/L — AB (ref 136–145)

## 2014-01-06 LAB — CBC WITH DIFFERENTIAL/PLATELET
BASOS PCT: 0.4 %
Basophil #: 0 10*3/uL (ref 0.0–0.1)
EOS ABS: 0.1 10*3/uL (ref 0.0–0.7)
Eosinophil %: 2 %
HCT: 21.5 % — AB (ref 35.0–47.0)
HGB: 7.6 g/dL — AB (ref 12.0–16.0)
LYMPHS ABS: 0.5 10*3/uL — AB (ref 1.0–3.6)
LYMPHS PCT: 7.6 %
MCH: 31 pg (ref 26.0–34.0)
MCHC: 35.6 g/dL (ref 32.0–36.0)
MCV: 87 fL (ref 80–100)
MONOS PCT: 12.9 %
Monocyte #: 0.8 x10 3/mm (ref 0.2–0.9)
Neutrophil #: 4.7 10*3/uL (ref 1.4–6.5)
Neutrophil %: 77.1 %
Platelet: 191 10*3/uL (ref 150–440)
RBC: 2.47 10*6/uL — ABNORMAL LOW (ref 3.80–5.20)
RDW: 12.5 % (ref 11.5–14.5)
WBC: 6.1 10*3/uL (ref 3.6–11.0)

## 2014-01-06 LAB — SODIUM: Sodium: 127 mmol/L — ABNORMAL LOW (ref 136–145)

## 2014-01-07 LAB — CBC WITH DIFFERENTIAL/PLATELET
Basophil #: 0 10*3/uL (ref 0.0–0.1)
Basophil %: 0.7 %
EOS ABS: 0.1 10*3/uL (ref 0.0–0.7)
EOS PCT: 3.2 %
HCT: 22.6 % — ABNORMAL LOW (ref 35.0–47.0)
HGB: 8 g/dL — AB (ref 12.0–16.0)
LYMPHS ABS: 0.4 10*3/uL — AB (ref 1.0–3.6)
Lymphocyte %: 9.6 %
MCH: 31.2 pg (ref 26.0–34.0)
MCHC: 35.4 g/dL (ref 32.0–36.0)
MCV: 88 fL (ref 80–100)
MONOS PCT: 12.8 %
Monocyte #: 0.6 x10 3/mm (ref 0.2–0.9)
NEUTROS ABS: 3.4 10*3/uL (ref 1.4–6.5)
Neutrophil %: 73.7 %
Platelet: 214 10*3/uL (ref 150–440)
RBC: 2.57 10*6/uL — AB (ref 3.80–5.20)
RDW: 12.5 % (ref 11.5–14.5)
WBC: 4.6 10*3/uL (ref 3.6–11.0)

## 2014-01-07 LAB — BASIC METABOLIC PANEL
Anion Gap: 5 — ABNORMAL LOW (ref 7–16)
BUN: 24 mg/dL — ABNORMAL HIGH (ref 7–18)
CALCIUM: 8.7 mg/dL (ref 8.5–10.1)
CHLORIDE: 100 mmol/L (ref 98–107)
Co2: 26 mmol/L (ref 21–32)
Creatinine: 1.34 mg/dL — ABNORMAL HIGH (ref 0.60–1.30)
EGFR (Non-African Amer.): 36 — ABNORMAL LOW
GFR CALC AF AMER: 41 — AB
Glucose: 97 mg/dL (ref 65–99)
Osmolality: 267 (ref 275–301)
Potassium: 3.6 mmol/L (ref 3.5–5.1)
SODIUM: 131 mmol/L — AB (ref 136–145)

## 2014-01-07 LAB — SODIUM
SODIUM: 133 mmol/L — AB (ref 136–145)
SODIUM: 131 mmol/L — AB (ref 136–145)
Sodium: 130 mmol/L — ABNORMAL LOW (ref 136–145)
Sodium: 130 mmol/L — ABNORMAL LOW (ref 136–145)
Sodium: 131 mmol/L — ABNORMAL LOW (ref 136–145)

## 2014-01-07 LAB — RETICULOCYTES
Absolute Retic Count: 0.0378 10*6/uL (ref 0.019–0.186)
RETICULOCYTE: 1.47 % (ref 0.4–3.1)

## 2014-01-07 LAB — LACTATE DEHYDROGENASE: LDH: 171 U/L (ref 81–246)

## 2014-01-08 LAB — BASIC METABOLIC PANEL
ANION GAP: 6 — AB (ref 7–16)
BUN: 27 mg/dL — ABNORMAL HIGH (ref 7–18)
CREATININE: 1.31 mg/dL — AB (ref 0.60–1.30)
Calcium, Total: 8.9 mg/dL (ref 8.5–10.1)
Chloride: 100 mmol/L (ref 98–107)
Co2: 26 mmol/L (ref 21–32)
EGFR (African American): 42 — ABNORMAL LOW
GFR CALC NON AF AMER: 37 — AB
Glucose: 93 mg/dL (ref 65–99)
OSMOLALITY: 269 (ref 275–301)
Potassium: 4.1 mmol/L (ref 3.5–5.1)
SODIUM: 132 mmol/L — AB (ref 136–145)

## 2014-01-08 LAB — HEMOGLOBIN: HGB: 7.4 g/dL — AB (ref 12.0–16.0)

## 2014-01-09 LAB — BASIC METABOLIC PANEL
Anion Gap: 4 — ABNORMAL LOW (ref 7–16)
BUN: 27 mg/dL — ABNORMAL HIGH (ref 7–18)
CALCIUM: 9.1 mg/dL (ref 8.5–10.1)
CHLORIDE: 101 mmol/L (ref 98–107)
CO2: 25 mmol/L (ref 21–32)
CREATININE: 1.53 mg/dL — AB (ref 0.60–1.30)
EGFR (African American): 35 — ABNORMAL LOW
EGFR (Non-African Amer.): 30 — ABNORMAL LOW
Glucose: 95 mg/dL (ref 65–99)
OSMOLALITY: 266 (ref 275–301)
Potassium: 3.9 mmol/L (ref 3.5–5.1)
Sodium: 130 mmol/L — ABNORMAL LOW (ref 136–145)

## 2014-01-09 LAB — CBC WITH DIFFERENTIAL/PLATELET
BASOS ABS: 0.1 10*3/uL (ref 0.0–0.1)
Basophil %: 1.5 %
Eosinophil #: 0.3 10*3/uL (ref 0.0–0.7)
Eosinophil %: 7.1 %
HCT: 22.1 % — ABNORMAL LOW (ref 35.0–47.0)
HGB: 7.3 g/dL — ABNORMAL LOW (ref 12.0–16.0)
LYMPHS ABS: 0.5 10*3/uL — AB (ref 1.0–3.6)
LYMPHS PCT: 12 %
MCH: 29.6 pg (ref 26.0–34.0)
MCHC: 33.1 g/dL (ref 32.0–36.0)
MCV: 89 fL (ref 80–100)
Monocyte #: 0.4 x10 3/mm (ref 0.2–0.9)
Monocyte %: 10.9 %
NEUTROS PCT: 68.5 %
Neutrophil #: 2.6 10*3/uL (ref 1.4–6.5)
PLATELETS: 254 10*3/uL (ref 150–440)
RBC: 2.47 10*6/uL — AB (ref 3.80–5.20)
RDW: 12.6 % (ref 11.5–14.5)
WBC: 3.8 10*3/uL (ref 3.6–11.0)

## 2014-01-10 DIAGNOSIS — K5900 Constipation, unspecified: Secondary | ICD-10-CM | POA: Diagnosis not present

## 2014-01-10 DIAGNOSIS — R5381 Other malaise: Secondary | ICD-10-CM | POA: Diagnosis not present

## 2014-01-10 DIAGNOSIS — R112 Nausea with vomiting, unspecified: Secondary | ICD-10-CM | POA: Diagnosis not present

## 2014-01-10 DIAGNOSIS — N19 Unspecified kidney failure: Secondary | ICD-10-CM | POA: Diagnosis not present

## 2014-01-10 DIAGNOSIS — R52 Pain, unspecified: Secondary | ICD-10-CM | POA: Diagnosis not present

## 2014-01-10 DIAGNOSIS — K5730 Diverticulosis of large intestine without perforation or abscess without bleeding: Secondary | ICD-10-CM | POA: Diagnosis not present

## 2014-01-10 DIAGNOSIS — R5383 Other fatigue: Secondary | ICD-10-CM | POA: Diagnosis not present

## 2014-01-10 DIAGNOSIS — K625 Hemorrhage of anus and rectum: Secondary | ICD-10-CM | POA: Diagnosis not present

## 2014-01-10 DIAGNOSIS — N179 Acute kidney failure, unspecified: Secondary | ICD-10-CM | POA: Diagnosis not present

## 2014-01-10 DIAGNOSIS — K219 Gastro-esophageal reflux disease without esophagitis: Secondary | ICD-10-CM | POA: Diagnosis not present

## 2014-01-10 DIAGNOSIS — D649 Anemia, unspecified: Secondary | ICD-10-CM | POA: Diagnosis not present

## 2014-01-10 DIAGNOSIS — R079 Chest pain, unspecified: Secondary | ICD-10-CM | POA: Diagnosis not present

## 2014-01-10 DIAGNOSIS — R197 Diarrhea, unspecified: Secondary | ICD-10-CM | POA: Diagnosis not present

## 2014-01-10 DIAGNOSIS — I10 Essential (primary) hypertension: Secondary | ICD-10-CM | POA: Diagnosis not present

## 2014-01-10 DIAGNOSIS — E871 Hypo-osmolality and hyponatremia: Secondary | ICD-10-CM | POA: Diagnosis not present

## 2014-01-10 LAB — BASIC METABOLIC PANEL
Anion Gap: 6 — ABNORMAL LOW (ref 7–16)
BUN: 25 mg/dL — ABNORMAL HIGH (ref 7–18)
CALCIUM: 8.7 mg/dL (ref 8.5–10.1)
Chloride: 98 mmol/L (ref 98–107)
Co2: 26 mmol/L (ref 21–32)
Creatinine: 1.44 mg/dL — ABNORMAL HIGH (ref 0.60–1.30)
EGFR (African American): 38 — ABNORMAL LOW
EGFR (Non-African Amer.): 33 — ABNORMAL LOW
Glucose: 95 mg/dL (ref 65–99)
Osmolality: 265 (ref 275–301)
Potassium: 3.9 mmol/L (ref 3.5–5.1)
SODIUM: 130 mmol/L — AB (ref 136–145)

## 2014-01-10 LAB — HEMOGLOBIN: HGB: 7.4 g/dL — ABNORMAL LOW (ref 12.0–16.0)

## 2014-01-31 DIAGNOSIS — M19019 Primary osteoarthritis, unspecified shoulder: Secondary | ICD-10-CM | POA: Diagnosis not present

## 2014-01-31 DIAGNOSIS — E871 Hypo-osmolality and hyponatremia: Secondary | ICD-10-CM | POA: Diagnosis not present

## 2014-01-31 DIAGNOSIS — I10 Essential (primary) hypertension: Secondary | ICD-10-CM | POA: Diagnosis not present

## 2014-01-31 DIAGNOSIS — Z96659 Presence of unspecified artificial knee joint: Secondary | ICD-10-CM | POA: Diagnosis not present

## 2014-01-31 DIAGNOSIS — Z9181 History of falling: Secondary | ICD-10-CM | POA: Diagnosis not present

## 2014-02-05 DIAGNOSIS — R6889 Other general symptoms and signs: Secondary | ICD-10-CM | POA: Diagnosis not present

## 2014-02-10 DIAGNOSIS — I10 Essential (primary) hypertension: Secondary | ICD-10-CM | POA: Diagnosis not present

## 2014-02-10 DIAGNOSIS — I209 Angina pectoris, unspecified: Secondary | ICD-10-CM | POA: Diagnosis not present

## 2014-02-10 DIAGNOSIS — D649 Anemia, unspecified: Secondary | ICD-10-CM | POA: Diagnosis not present

## 2014-02-10 DIAGNOSIS — E871 Hypo-osmolality and hyponatremia: Secondary | ICD-10-CM | POA: Diagnosis not present

## 2014-02-12 DIAGNOSIS — E871 Hypo-osmolality and hyponatremia: Secondary | ICD-10-CM | POA: Diagnosis not present

## 2014-02-12 DIAGNOSIS — I10 Essential (primary) hypertension: Secondary | ICD-10-CM | POA: Diagnosis not present

## 2014-02-12 DIAGNOSIS — M19019 Primary osteoarthritis, unspecified shoulder: Secondary | ICD-10-CM | POA: Diagnosis not present

## 2014-02-25 DIAGNOSIS — E871 Hypo-osmolality and hyponatremia: Secondary | ICD-10-CM | POA: Diagnosis not present

## 2014-03-05 DIAGNOSIS — Z79899 Other long term (current) drug therapy: Secondary | ICD-10-CM | POA: Diagnosis not present

## 2014-03-11 DIAGNOSIS — K2100 Gastro-esophageal reflux disease with esophagitis, without bleeding: Secondary | ICD-10-CM | POA: Diagnosis not present

## 2014-03-11 DIAGNOSIS — I10 Essential (primary) hypertension: Secondary | ICD-10-CM | POA: Diagnosis not present

## 2014-03-11 DIAGNOSIS — E871 Hypo-osmolality and hyponatremia: Secondary | ICD-10-CM | POA: Diagnosis not present

## 2014-04-01 DIAGNOSIS — Z9181 History of falling: Secondary | ICD-10-CM | POA: Diagnosis not present

## 2014-04-01 DIAGNOSIS — Z96659 Presence of unspecified artificial knee joint: Secondary | ICD-10-CM | POA: Diagnosis not present

## 2014-04-01 DIAGNOSIS — E871 Hypo-osmolality and hyponatremia: Secondary | ICD-10-CM | POA: Diagnosis not present

## 2014-04-01 DIAGNOSIS — M19019 Primary osteoarthritis, unspecified shoulder: Secondary | ICD-10-CM | POA: Diagnosis not present

## 2014-04-01 DIAGNOSIS — I10 Essential (primary) hypertension: Secondary | ICD-10-CM | POA: Diagnosis not present

## 2014-04-02 DIAGNOSIS — R6889 Other general symptoms and signs: Secondary | ICD-10-CM | POA: Diagnosis not present

## 2014-04-11 DIAGNOSIS — Z79899 Other long term (current) drug therapy: Secondary | ICD-10-CM | POA: Diagnosis not present

## 2014-04-16 DIAGNOSIS — Z5189 Encounter for other specified aftercare: Secondary | ICD-10-CM | POA: Diagnosis not present

## 2014-04-16 DIAGNOSIS — I10 Essential (primary) hypertension: Secondary | ICD-10-CM | POA: Diagnosis not present

## 2014-04-16 DIAGNOSIS — E871 Hypo-osmolality and hyponatremia: Secondary | ICD-10-CM | POA: Diagnosis not present

## 2014-04-16 DIAGNOSIS — R011 Cardiac murmur, unspecified: Secondary | ICD-10-CM | POA: Diagnosis not present

## 2014-05-05 DIAGNOSIS — Z79899 Other long term (current) drug therapy: Secondary | ICD-10-CM | POA: Diagnosis not present

## 2014-05-05 DIAGNOSIS — I10 Essential (primary) hypertension: Secondary | ICD-10-CM | POA: Diagnosis not present

## 2014-05-12 DIAGNOSIS — H353290 Exudative age-related macular degeneration, unspecified eye, stage unspecified: Secondary | ICD-10-CM | POA: Diagnosis not present

## 2014-05-19 DIAGNOSIS — D631 Anemia in chronic kidney disease: Secondary | ICD-10-CM | POA: Diagnosis not present

## 2014-05-19 DIAGNOSIS — I10 Essential (primary) hypertension: Secondary | ICD-10-CM | POA: Diagnosis not present

## 2014-05-19 DIAGNOSIS — N039 Chronic nephritic syndrome with unspecified morphologic changes: Secondary | ICD-10-CM | POA: Diagnosis not present

## 2014-05-19 DIAGNOSIS — N183 Chronic kidney disease, stage 3 unspecified: Secondary | ICD-10-CM | POA: Diagnosis not present

## 2014-05-19 DIAGNOSIS — E871 Hypo-osmolality and hyponatremia: Secondary | ICD-10-CM | POA: Diagnosis not present

## 2014-05-19 DIAGNOSIS — N2581 Secondary hyperparathyroidism of renal origin: Secondary | ICD-10-CM | POA: Diagnosis not present

## 2014-05-26 DIAGNOSIS — D631 Anemia in chronic kidney disease: Secondary | ICD-10-CM | POA: Diagnosis not present

## 2014-05-26 DIAGNOSIS — N039 Chronic nephritic syndrome with unspecified morphologic changes: Secondary | ICD-10-CM | POA: Diagnosis not present

## 2014-05-26 DIAGNOSIS — I10 Essential (primary) hypertension: Secondary | ICD-10-CM | POA: Diagnosis not present

## 2014-05-26 DIAGNOSIS — N2581 Secondary hyperparathyroidism of renal origin: Secondary | ICD-10-CM | POA: Diagnosis not present

## 2014-05-26 DIAGNOSIS — E871 Hypo-osmolality and hyponatremia: Secondary | ICD-10-CM | POA: Diagnosis not present

## 2014-05-26 DIAGNOSIS — N183 Chronic kidney disease, stage 3 unspecified: Secondary | ICD-10-CM | POA: Diagnosis not present

## 2014-05-31 DIAGNOSIS — Z9181 History of falling: Secondary | ICD-10-CM | POA: Diagnosis not present

## 2014-05-31 DIAGNOSIS — M19019 Primary osteoarthritis, unspecified shoulder: Secondary | ICD-10-CM | POA: Diagnosis not present

## 2014-05-31 DIAGNOSIS — E871 Hypo-osmolality and hyponatremia: Secondary | ICD-10-CM | POA: Diagnosis not present

## 2014-05-31 DIAGNOSIS — I10 Essential (primary) hypertension: Secondary | ICD-10-CM | POA: Diagnosis not present

## 2014-05-31 DIAGNOSIS — I509 Heart failure, unspecified: Secondary | ICD-10-CM | POA: Diagnosis not present

## 2014-05-31 DIAGNOSIS — Z96659 Presence of unspecified artificial knee joint: Secondary | ICD-10-CM | POA: Diagnosis not present

## 2014-06-03 DIAGNOSIS — I10 Essential (primary) hypertension: Secondary | ICD-10-CM | POA: Diagnosis not present

## 2014-06-03 DIAGNOSIS — Z96659 Presence of unspecified artificial knee joint: Secondary | ICD-10-CM | POA: Diagnosis not present

## 2014-06-03 DIAGNOSIS — I509 Heart failure, unspecified: Secondary | ICD-10-CM | POA: Diagnosis not present

## 2014-06-03 DIAGNOSIS — M19019 Primary osteoarthritis, unspecified shoulder: Secondary | ICD-10-CM | POA: Diagnosis not present

## 2014-06-03 DIAGNOSIS — E871 Hypo-osmolality and hyponatremia: Secondary | ICD-10-CM | POA: Diagnosis not present

## 2014-06-03 DIAGNOSIS — Z9181 History of falling: Secondary | ICD-10-CM | POA: Diagnosis not present

## 2014-06-09 DIAGNOSIS — N2581 Secondary hyperparathyroidism of renal origin: Secondary | ICD-10-CM | POA: Diagnosis not present

## 2014-06-09 DIAGNOSIS — D631 Anemia in chronic kidney disease: Secondary | ICD-10-CM | POA: Diagnosis not present

## 2014-06-09 DIAGNOSIS — E871 Hypo-osmolality and hyponatremia: Secondary | ICD-10-CM | POA: Diagnosis not present

## 2014-06-09 DIAGNOSIS — I509 Heart failure, unspecified: Secondary | ICD-10-CM | POA: Diagnosis not present

## 2014-06-09 DIAGNOSIS — Z9181 History of falling: Secondary | ICD-10-CM | POA: Diagnosis not present

## 2014-06-09 DIAGNOSIS — N183 Chronic kidney disease, stage 3 unspecified: Secondary | ICD-10-CM | POA: Diagnosis not present

## 2014-06-09 DIAGNOSIS — I10 Essential (primary) hypertension: Secondary | ICD-10-CM | POA: Diagnosis not present

## 2014-06-10 DIAGNOSIS — Z9181 History of falling: Secondary | ICD-10-CM | POA: Diagnosis not present

## 2014-06-10 DIAGNOSIS — I509 Heart failure, unspecified: Secondary | ICD-10-CM | POA: Diagnosis not present

## 2014-06-10 DIAGNOSIS — Z96659 Presence of unspecified artificial knee joint: Secondary | ICD-10-CM | POA: Diagnosis not present

## 2014-06-10 DIAGNOSIS — M19019 Primary osteoarthritis, unspecified shoulder: Secondary | ICD-10-CM | POA: Diagnosis not present

## 2014-06-10 DIAGNOSIS — I10 Essential (primary) hypertension: Secondary | ICD-10-CM | POA: Diagnosis not present

## 2014-06-10 DIAGNOSIS — E871 Hypo-osmolality and hyponatremia: Secondary | ICD-10-CM | POA: Diagnosis not present

## 2014-06-15 DIAGNOSIS — Z96659 Presence of unspecified artificial knee joint: Secondary | ICD-10-CM | POA: Diagnosis not present

## 2014-06-15 DIAGNOSIS — M19019 Primary osteoarthritis, unspecified shoulder: Secondary | ICD-10-CM | POA: Diagnosis not present

## 2014-06-15 DIAGNOSIS — E871 Hypo-osmolality and hyponatremia: Secondary | ICD-10-CM | POA: Diagnosis not present

## 2014-06-15 DIAGNOSIS — I509 Heart failure, unspecified: Secondary | ICD-10-CM | POA: Diagnosis not present

## 2014-06-15 DIAGNOSIS — I10 Essential (primary) hypertension: Secondary | ICD-10-CM | POA: Diagnosis not present

## 2014-06-15 DIAGNOSIS — Z9181 History of falling: Secondary | ICD-10-CM | POA: Diagnosis not present

## 2014-06-23 DIAGNOSIS — M19019 Primary osteoarthritis, unspecified shoulder: Secondary | ICD-10-CM | POA: Diagnosis not present

## 2014-06-23 DIAGNOSIS — Z96659 Presence of unspecified artificial knee joint: Secondary | ICD-10-CM | POA: Diagnosis not present

## 2014-06-23 DIAGNOSIS — I509 Heart failure, unspecified: Secondary | ICD-10-CM | POA: Diagnosis not present

## 2014-06-23 DIAGNOSIS — Z9181 History of falling: Secondary | ICD-10-CM | POA: Diagnosis not present

## 2014-06-23 DIAGNOSIS — E871 Hypo-osmolality and hyponatremia: Secondary | ICD-10-CM | POA: Diagnosis not present

## 2014-06-23 DIAGNOSIS — I10 Essential (primary) hypertension: Secondary | ICD-10-CM | POA: Diagnosis not present

## 2014-06-27 DIAGNOSIS — D631 Anemia in chronic kidney disease: Secondary | ICD-10-CM | POA: Diagnosis not present

## 2014-06-27 DIAGNOSIS — N2581 Secondary hyperparathyroidism of renal origin: Secondary | ICD-10-CM | POA: Diagnosis not present

## 2014-06-27 DIAGNOSIS — N183 Chronic kidney disease, stage 3 unspecified: Secondary | ICD-10-CM | POA: Diagnosis not present

## 2014-06-27 DIAGNOSIS — N039 Chronic nephritic syndrome with unspecified morphologic changes: Secondary | ICD-10-CM | POA: Diagnosis not present

## 2014-06-27 DIAGNOSIS — I10 Essential (primary) hypertension: Secondary | ICD-10-CM | POA: Diagnosis not present

## 2014-07-01 DIAGNOSIS — M19019 Primary osteoarthritis, unspecified shoulder: Secondary | ICD-10-CM | POA: Diagnosis not present

## 2014-07-01 DIAGNOSIS — Z9181 History of falling: Secondary | ICD-10-CM | POA: Diagnosis not present

## 2014-07-01 DIAGNOSIS — I509 Heart failure, unspecified: Secondary | ICD-10-CM | POA: Diagnosis not present

## 2014-07-01 DIAGNOSIS — E871 Hypo-osmolality and hyponatremia: Secondary | ICD-10-CM | POA: Diagnosis not present

## 2014-07-01 DIAGNOSIS — Z96659 Presence of unspecified artificial knee joint: Secondary | ICD-10-CM | POA: Diagnosis not present

## 2014-07-01 DIAGNOSIS — I10 Essential (primary) hypertension: Secondary | ICD-10-CM | POA: Diagnosis not present

## 2014-07-02 DIAGNOSIS — Z9181 History of falling: Secondary | ICD-10-CM | POA: Diagnosis not present

## 2014-07-02 DIAGNOSIS — M19019 Primary osteoarthritis, unspecified shoulder: Secondary | ICD-10-CM | POA: Diagnosis not present

## 2014-07-02 DIAGNOSIS — E871 Hypo-osmolality and hyponatremia: Secondary | ICD-10-CM | POA: Diagnosis not present

## 2014-07-02 DIAGNOSIS — I10 Essential (primary) hypertension: Secondary | ICD-10-CM | POA: Diagnosis not present

## 2014-07-02 DIAGNOSIS — I509 Heart failure, unspecified: Secondary | ICD-10-CM | POA: Diagnosis not present

## 2014-07-02 DIAGNOSIS — Z96659 Presence of unspecified artificial knee joint: Secondary | ICD-10-CM | POA: Diagnosis not present

## 2014-07-08 DIAGNOSIS — E785 Hyperlipidemia, unspecified: Secondary | ICD-10-CM | POA: Diagnosis not present

## 2014-07-08 DIAGNOSIS — E871 Hypo-osmolality and hyponatremia: Secondary | ICD-10-CM | POA: Diagnosis not present

## 2014-07-08 DIAGNOSIS — N183 Chronic kidney disease, stage 3 unspecified: Secondary | ICD-10-CM | POA: Diagnosis not present

## 2014-07-08 DIAGNOSIS — I10 Essential (primary) hypertension: Secondary | ICD-10-CM | POA: Diagnosis not present

## 2014-07-13 DIAGNOSIS — E871 Hypo-osmolality and hyponatremia: Secondary | ICD-10-CM | POA: Diagnosis not present

## 2014-07-13 DIAGNOSIS — M19019 Primary osteoarthritis, unspecified shoulder: Secondary | ICD-10-CM | POA: Diagnosis not present

## 2014-07-13 DIAGNOSIS — Z96659 Presence of unspecified artificial knee joint: Secondary | ICD-10-CM | POA: Diagnosis not present

## 2014-07-13 DIAGNOSIS — I509 Heart failure, unspecified: Secondary | ICD-10-CM | POA: Diagnosis not present

## 2014-07-13 DIAGNOSIS — Z9181 History of falling: Secondary | ICD-10-CM | POA: Diagnosis not present

## 2014-07-13 DIAGNOSIS — I10 Essential (primary) hypertension: Secondary | ICD-10-CM | POA: Diagnosis not present

## 2014-07-22 ENCOUNTER — Inpatient Hospital Stay: Payer: Self-pay | Admitting: Family Medicine

## 2014-07-22 DIAGNOSIS — E782 Mixed hyperlipidemia: Secondary | ICD-10-CM | POA: Diagnosis not present

## 2014-07-22 DIAGNOSIS — N281 Cyst of kidney, acquired: Secondary | ICD-10-CM | POA: Diagnosis not present

## 2014-07-22 DIAGNOSIS — N184 Chronic kidney disease, stage 4 (severe): Secondary | ICD-10-CM | POA: Diagnosis not present

## 2014-07-22 DIAGNOSIS — Z882 Allergy status to sulfonamides status: Secondary | ICD-10-CM | POA: Diagnosis not present

## 2014-07-22 DIAGNOSIS — R5383 Other fatigue: Secondary | ICD-10-CM | POA: Diagnosis not present

## 2014-07-22 DIAGNOSIS — I509 Heart failure, unspecified: Secondary | ICD-10-CM | POA: Diagnosis not present

## 2014-07-22 DIAGNOSIS — Z66 Do not resuscitate: Secondary | ICD-10-CM | POA: Diagnosis present

## 2014-07-22 DIAGNOSIS — R5381 Other malaise: Secondary | ICD-10-CM | POA: Diagnosis not present

## 2014-07-22 DIAGNOSIS — K219 Gastro-esophageal reflux disease without esophagitis: Secondary | ICD-10-CM | POA: Diagnosis present

## 2014-07-22 DIAGNOSIS — J209 Acute bronchitis, unspecified: Secondary | ICD-10-CM | POA: Diagnosis present

## 2014-07-22 DIAGNOSIS — Z880 Allergy status to penicillin: Secondary | ICD-10-CM | POA: Diagnosis not present

## 2014-07-22 DIAGNOSIS — R51 Headache: Secondary | ICD-10-CM | POA: Diagnosis not present

## 2014-07-22 DIAGNOSIS — E860 Dehydration: Secondary | ICD-10-CM | POA: Diagnosis not present

## 2014-07-22 DIAGNOSIS — J9819 Other pulmonary collapse: Secondary | ICD-10-CM | POA: Diagnosis not present

## 2014-07-22 DIAGNOSIS — N19 Unspecified kidney failure: Secondary | ICD-10-CM | POA: Diagnosis not present

## 2014-07-22 DIAGNOSIS — M1990 Unspecified osteoarthritis, unspecified site: Secondary | ICD-10-CM | POA: Diagnosis present

## 2014-07-22 DIAGNOSIS — R609 Edema, unspecified: Secondary | ICD-10-CM | POA: Diagnosis not present

## 2014-07-22 DIAGNOSIS — E871 Hypo-osmolality and hyponatremia: Secondary | ICD-10-CM | POA: Diagnosis not present

## 2014-07-22 DIAGNOSIS — I4891 Unspecified atrial fibrillation: Secondary | ICD-10-CM | POA: Diagnosis not present

## 2014-07-22 DIAGNOSIS — E785 Hyperlipidemia, unspecified: Secondary | ICD-10-CM | POA: Diagnosis present

## 2014-07-22 DIAGNOSIS — F0390 Unspecified dementia without behavioral disturbance: Secondary | ICD-10-CM | POA: Diagnosis present

## 2014-07-22 DIAGNOSIS — K5900 Constipation, unspecified: Secondary | ICD-10-CM | POA: Diagnosis not present

## 2014-07-22 DIAGNOSIS — R0602 Shortness of breath: Secondary | ICD-10-CM | POA: Diagnosis not present

## 2014-07-22 DIAGNOSIS — R0609 Other forms of dyspnea: Secondary | ICD-10-CM | POA: Diagnosis not present

## 2014-07-22 DIAGNOSIS — Z5189 Encounter for other specified aftercare: Secondary | ICD-10-CM | POA: Diagnosis not present

## 2014-07-22 DIAGNOSIS — R52 Pain, unspecified: Secondary | ICD-10-CM | POA: Diagnosis not present

## 2014-07-22 DIAGNOSIS — I5031 Acute diastolic (congestive) heart failure: Secondary | ICD-10-CM | POA: Diagnosis not present

## 2014-07-22 DIAGNOSIS — I10 Essential (primary) hypertension: Secondary | ICD-10-CM | POA: Diagnosis not present

## 2014-07-22 DIAGNOSIS — R0989 Other specified symptoms and signs involving the circulatory and respiratory systems: Secondary | ICD-10-CM | POA: Diagnosis not present

## 2014-07-22 DIAGNOSIS — K439 Ventral hernia without obstruction or gangrene: Secondary | ICD-10-CM | POA: Diagnosis present

## 2014-07-22 DIAGNOSIS — K5730 Diverticulosis of large intestine without perforation or abscess without bleeding: Secondary | ICD-10-CM | POA: Diagnosis not present

## 2014-07-22 DIAGNOSIS — R0789 Other chest pain: Secondary | ICD-10-CM | POA: Diagnosis not present

## 2014-07-22 DIAGNOSIS — J984 Other disorders of lung: Secondary | ICD-10-CM | POA: Diagnosis not present

## 2014-07-22 DIAGNOSIS — N179 Acute kidney failure, unspecified: Secondary | ICD-10-CM | POA: Diagnosis not present

## 2014-07-22 DIAGNOSIS — I129 Hypertensive chronic kidney disease with stage 1 through stage 4 chronic kidney disease, or unspecified chronic kidney disease: Secondary | ICD-10-CM | POA: Diagnosis not present

## 2014-07-22 DIAGNOSIS — I2789 Other specified pulmonary heart diseases: Secondary | ICD-10-CM | POA: Diagnosis not present

## 2014-07-22 DIAGNOSIS — R079 Chest pain, unspecified: Secondary | ICD-10-CM | POA: Diagnosis not present

## 2014-07-22 DIAGNOSIS — J90 Pleural effusion, not elsewhere classified: Secondary | ICD-10-CM | POA: Diagnosis not present

## 2014-07-22 DIAGNOSIS — I517 Cardiomegaly: Secondary | ICD-10-CM | POA: Diagnosis not present

## 2014-07-22 DIAGNOSIS — D509 Iron deficiency anemia, unspecified: Secondary | ICD-10-CM | POA: Diagnosis not present

## 2014-07-22 DIAGNOSIS — D649 Anemia, unspecified: Secondary | ICD-10-CM | POA: Diagnosis present

## 2014-07-22 DIAGNOSIS — E041 Nontoxic single thyroid nodule: Secondary | ICD-10-CM | POA: Diagnosis present

## 2014-07-22 LAB — CBC
HCT: 29.2 % — ABNORMAL LOW (ref 35.0–47.0)
HGB: 10 g/dL — AB (ref 12.0–16.0)
MCH: 30.2 pg (ref 26.0–34.0)
MCHC: 34.1 g/dL (ref 32.0–36.0)
MCV: 89 fL (ref 80–100)
PLATELETS: 264 10*3/uL (ref 150–440)
RBC: 3.3 10*6/uL — AB (ref 3.80–5.20)
RDW: 13.9 % (ref 11.5–14.5)
WBC: 8.5 10*3/uL (ref 3.6–11.0)

## 2014-07-22 LAB — COMPREHENSIVE METABOLIC PANEL
ALK PHOS: 97 U/L
AST: 20 U/L (ref 15–37)
Albumin: 3.7 g/dL (ref 3.4–5.0)
Anion Gap: 10 (ref 7–16)
BUN: 40 mg/dL — AB (ref 7–18)
Bilirubin,Total: 0.4 mg/dL (ref 0.2–1.0)
CHLORIDE: 93 mmol/L — AB (ref 98–107)
CREATININE: 1.98 mg/dL — AB (ref 0.60–1.30)
Calcium, Total: 9.7 mg/dL (ref 8.5–10.1)
Co2: 25 mmol/L (ref 21–32)
EGFR (African American): 26 — ABNORMAL LOW
GFR CALC NON AF AMER: 22 — AB
Glucose: 108 mg/dL — ABNORMAL HIGH (ref 65–99)
OSMOLALITY: 267 (ref 275–301)
POTASSIUM: 3.9 mmol/L (ref 3.5–5.1)
SGPT (ALT): 19 U/L
SODIUM: 128 mmol/L — AB (ref 136–145)
Total Protein: 7.6 g/dL (ref 6.4–8.2)

## 2014-07-22 LAB — URINALYSIS, COMPLETE
Bilirubin,UR: NEGATIVE
GLUCOSE, UR: NEGATIVE mg/dL (ref 0–75)
Ketone: NEGATIVE
LEUKOCYTE ESTERASE: NEGATIVE
NITRITE: NEGATIVE
PH: 7 (ref 4.5–8.0)
Protein: 100
Specific Gravity: 1.006 (ref 1.003–1.030)
Squamous Epithelial: <1
WBC UR: 1 /HPF (ref 0–5)

## 2014-07-22 LAB — CK TOTAL AND CKMB (NOT AT ARMC)
CK, Total: 147 U/L
CK-MB: 3.7 ng/mL — AB (ref 0.5–3.6)

## 2014-07-22 LAB — TROPONIN I
TROPONIN-I: 0.03 ng/mL
Troponin-I: 0.02 ng/mL
Troponin-I: 0.03 ng/mL

## 2014-07-22 LAB — D-DIMER(ARMC): D-Dimer: 2349 ng/ml

## 2014-07-23 DIAGNOSIS — E860 Dehydration: Secondary | ICD-10-CM | POA: Diagnosis not present

## 2014-07-23 DIAGNOSIS — N19 Unspecified kidney failure: Secondary | ICD-10-CM | POA: Diagnosis not present

## 2014-07-23 DIAGNOSIS — I10 Essential (primary) hypertension: Secondary | ICD-10-CM | POA: Diagnosis not present

## 2014-07-23 DIAGNOSIS — R079 Chest pain, unspecified: Secondary | ICD-10-CM | POA: Diagnosis not present

## 2014-07-23 LAB — BASIC METABOLIC PANEL
Anion Gap: 9 (ref 7–16)
BUN: 29 mg/dL — ABNORMAL HIGH (ref 7–18)
CHLORIDE: 97 mmol/L — AB (ref 98–107)
Calcium, Total: 8.3 mg/dL — ABNORMAL LOW (ref 8.5–10.1)
Co2: 24 mmol/L (ref 21–32)
Creatinine: 1.84 mg/dL — ABNORMAL HIGH (ref 0.60–1.30)
EGFR (African American): 28 — ABNORMAL LOW
EGFR (Non-African Amer.): 24 — ABNORMAL LOW
Glucose: 89 mg/dL (ref 65–99)
OSMOLALITY: 266 (ref 275–301)
Potassium: 3.9 mmol/L (ref 3.5–5.1)
SODIUM: 130 mmol/L — AB (ref 136–145)

## 2014-07-23 LAB — LIPID PANEL
Cholesterol: 131 mg/dL (ref 0–200)
HDL: 39 mg/dL — AB (ref 40–60)
Ldl Cholesterol, Calc: 76 mg/dL (ref 0–100)
Triglycerides: 79 mg/dL (ref 0–200)
VLDL Cholesterol, Calc: 16 mg/dL (ref 5–40)

## 2014-07-23 LAB — MAGNESIUM: Magnesium: 2.1 mg/dL

## 2014-07-23 LAB — HEMOGLOBIN A1C: Hemoglobin A1C: 8.2 % — ABNORMAL HIGH (ref 4.2–6.3)

## 2014-07-23 LAB — TSH: Thyroid Stimulating Horm: 0.928 u[IU]/mL

## 2014-07-24 DIAGNOSIS — E860 Dehydration: Secondary | ICD-10-CM | POA: Diagnosis not present

## 2014-07-24 DIAGNOSIS — I10 Essential (primary) hypertension: Secondary | ICD-10-CM | POA: Diagnosis not present

## 2014-07-24 DIAGNOSIS — R079 Chest pain, unspecified: Secondary | ICD-10-CM | POA: Diagnosis not present

## 2014-07-24 DIAGNOSIS — N19 Unspecified kidney failure: Secondary | ICD-10-CM | POA: Diagnosis not present

## 2014-07-25 DIAGNOSIS — E860 Dehydration: Secondary | ICD-10-CM | POA: Diagnosis not present

## 2014-07-25 DIAGNOSIS — R079 Chest pain, unspecified: Secondary | ICD-10-CM | POA: Diagnosis not present

## 2014-07-25 DIAGNOSIS — I2789 Other specified pulmonary heart diseases: Secondary | ICD-10-CM | POA: Diagnosis not present

## 2014-07-25 DIAGNOSIS — I509 Heart failure, unspecified: Secondary | ICD-10-CM | POA: Diagnosis not present

## 2014-07-25 DIAGNOSIS — N19 Unspecified kidney failure: Secondary | ICD-10-CM | POA: Diagnosis not present

## 2014-07-25 DIAGNOSIS — I10 Essential (primary) hypertension: Secondary | ICD-10-CM | POA: Diagnosis not present

## 2014-07-25 DIAGNOSIS — I129 Hypertensive chronic kidney disease with stage 1 through stage 4 chronic kidney disease, or unspecified chronic kidney disease: Secondary | ICD-10-CM | POA: Diagnosis not present

## 2014-07-25 DIAGNOSIS — I4891 Unspecified atrial fibrillation: Secondary | ICD-10-CM | POA: Diagnosis not present

## 2014-07-25 LAB — CBC WITH DIFFERENTIAL/PLATELET
BASOS PCT: 1 %
Basophil #: 0 10*3/uL (ref 0.0–0.1)
Eosinophil #: 0.4 10*3/uL (ref 0.0–0.7)
Eosinophil %: 10.6 %
HCT: 24.2 % — AB (ref 35.0–47.0)
HGB: 8 g/dL — ABNORMAL LOW (ref 12.0–16.0)
Lymphocyte #: 0.5 10*3/uL — ABNORMAL LOW (ref 1.0–3.6)
Lymphocyte %: 10.9 %
MCH: 29.5 pg (ref 26.0–34.0)
MCHC: 33 g/dL (ref 32.0–36.0)
MCV: 89 fL (ref 80–100)
Monocyte #: 0.4 x10 3/mm (ref 0.2–0.9)
Monocyte %: 10.3 %
NEUTROS ABS: 2.8 10*3/uL (ref 1.4–6.5)
Neutrophil %: 67.2 %
Platelet: 221 10*3/uL (ref 150–440)
RBC: 2.71 10*6/uL — ABNORMAL LOW (ref 3.80–5.20)
RDW: 13.4 % (ref 11.5–14.5)
WBC: 4.2 10*3/uL (ref 3.6–11.0)

## 2014-07-25 LAB — BASIC METABOLIC PANEL
Anion Gap: 12 (ref 7–16)
BUN: 24 mg/dL — ABNORMAL HIGH (ref 7–18)
Calcium, Total: 8.4 mg/dL — ABNORMAL LOW (ref 8.5–10.1)
Chloride: 99 mmol/L (ref 98–107)
Co2: 22 mmol/L (ref 21–32)
Creatinine: 1.76 mg/dL — ABNORMAL HIGH (ref 0.60–1.30)
EGFR (African American): 30 — ABNORMAL LOW
EGFR (Non-African Amer.): 26 — ABNORMAL LOW
GLUCOSE: 89 mg/dL (ref 65–99)
Osmolality: 270 (ref 275–301)
POTASSIUM: 4.1 mmol/L (ref 3.5–5.1)
SODIUM: 133 mmol/L — AB (ref 136–145)

## 2014-07-25 LAB — CK TOTAL AND CKMB (NOT AT ARMC)
CK, TOTAL: 91 U/L
CK, Total: 89 U/L
CK, Total: 94 U/L
CK, Total: 92 U/L
CK-MB: 3 ng/mL (ref 0.5–3.6)
CK-MB: 3.1 ng/mL (ref 0.5–3.6)
CK-MB: 3.5 ng/mL (ref 0.5–3.6)
CK-MB: 3.9 ng/mL — ABNORMAL HIGH (ref 0.5–3.6)

## 2014-07-25 LAB — TROPONIN I
TROPONIN-I: 0.02 ng/mL
TROPONIN-I: 0.03 ng/mL
Troponin-I: 0.02 ng/mL
Troponin-I: 0.04 ng/mL

## 2014-07-26 DIAGNOSIS — I129 Hypertensive chronic kidney disease with stage 1 through stage 4 chronic kidney disease, or unspecified chronic kidney disease: Secondary | ICD-10-CM | POA: Diagnosis not present

## 2014-07-26 DIAGNOSIS — I509 Heart failure, unspecified: Secondary | ICD-10-CM | POA: Diagnosis not present

## 2014-07-26 DIAGNOSIS — N19 Unspecified kidney failure: Secondary | ICD-10-CM | POA: Diagnosis not present

## 2014-07-26 DIAGNOSIS — E860 Dehydration: Secondary | ICD-10-CM | POA: Diagnosis not present

## 2014-07-26 DIAGNOSIS — I10 Essential (primary) hypertension: Secondary | ICD-10-CM | POA: Diagnosis not present

## 2014-07-26 DIAGNOSIS — I4891 Unspecified atrial fibrillation: Secondary | ICD-10-CM | POA: Diagnosis not present

## 2014-07-26 DIAGNOSIS — I2789 Other specified pulmonary heart diseases: Secondary | ICD-10-CM | POA: Diagnosis not present

## 2014-07-26 DIAGNOSIS — R079 Chest pain, unspecified: Secondary | ICD-10-CM | POA: Diagnosis not present

## 2014-07-26 LAB — BASIC METABOLIC PANEL
Anion Gap: 9 (ref 7–16)
BUN: 20 mg/dL — ABNORMAL HIGH (ref 7–18)
Calcium, Total: 8.1 mg/dL — ABNORMAL LOW (ref 8.5–10.1)
Chloride: 99 mmol/L (ref 98–107)
Co2: 23 mmol/L (ref 21–32)
Creatinine: 1.53 mg/dL — ABNORMAL HIGH (ref 0.60–1.30)
EGFR (African American): 35 — ABNORMAL LOW
GFR CALC NON AF AMER: 30 — AB
Glucose: 83 mg/dL (ref 65–99)
OSMOLALITY: 264 (ref 275–301)
Potassium: 3.9 mmol/L (ref 3.5–5.1)
SODIUM: 131 mmol/L — AB (ref 136–145)

## 2014-07-27 DIAGNOSIS — I129 Hypertensive chronic kidney disease with stage 1 through stage 4 chronic kidney disease, or unspecified chronic kidney disease: Secondary | ICD-10-CM | POA: Diagnosis not present

## 2014-07-27 DIAGNOSIS — I10 Essential (primary) hypertension: Secondary | ICD-10-CM | POA: Diagnosis not present

## 2014-07-27 DIAGNOSIS — I4891 Unspecified atrial fibrillation: Secondary | ICD-10-CM | POA: Diagnosis not present

## 2014-07-27 DIAGNOSIS — N19 Unspecified kidney failure: Secondary | ICD-10-CM | POA: Diagnosis not present

## 2014-07-27 DIAGNOSIS — I2789 Other specified pulmonary heart diseases: Secondary | ICD-10-CM | POA: Diagnosis not present

## 2014-07-27 DIAGNOSIS — E860 Dehydration: Secondary | ICD-10-CM | POA: Diagnosis not present

## 2014-07-27 DIAGNOSIS — I509 Heart failure, unspecified: Secondary | ICD-10-CM | POA: Diagnosis not present

## 2014-07-27 DIAGNOSIS — R079 Chest pain, unspecified: Secondary | ICD-10-CM | POA: Diagnosis not present

## 2014-07-27 LAB — CBC WITH DIFFERENTIAL/PLATELET
Basophil #: 0 10*3/uL (ref 0.0–0.1)
Basophil %: 0.7 %
Eosinophil #: 0.2 10*3/uL (ref 0.0–0.7)
Eosinophil %: 5.1 %
HCT: 23.7 % — AB (ref 35.0–47.0)
HGB: 8 g/dL — ABNORMAL LOW (ref 12.0–16.0)
LYMPHS ABS: 0.4 10*3/uL — AB (ref 1.0–3.6)
LYMPHS PCT: 13.5 %
MCH: 29.8 pg (ref 26.0–34.0)
MCHC: 33.9 g/dL (ref 32.0–36.0)
MCV: 88 fL (ref 80–100)
MONOS PCT: 12.5 %
Monocyte #: 0.4 x10 3/mm (ref 0.2–0.9)
Neutrophil #: 2.3 10*3/uL (ref 1.4–6.5)
Neutrophil %: 68.2 %
Platelet: 228 10*3/uL (ref 150–440)
RBC: 2.69 10*6/uL — ABNORMAL LOW (ref 3.80–5.20)
RDW: 13.7 % (ref 11.5–14.5)
WBC: 3.3 10*3/uL — ABNORMAL LOW (ref 3.6–11.0)

## 2014-07-28 DIAGNOSIS — I2789 Other specified pulmonary heart diseases: Secondary | ICD-10-CM | POA: Diagnosis not present

## 2014-07-28 DIAGNOSIS — I129 Hypertensive chronic kidney disease with stage 1 through stage 4 chronic kidney disease, or unspecified chronic kidney disease: Secondary | ICD-10-CM | POA: Diagnosis not present

## 2014-07-28 DIAGNOSIS — I509 Heart failure, unspecified: Secondary | ICD-10-CM | POA: Diagnosis not present

## 2014-07-28 DIAGNOSIS — I4891 Unspecified atrial fibrillation: Secondary | ICD-10-CM | POA: Diagnosis not present

## 2014-07-28 LAB — BASIC METABOLIC PANEL
Anion Gap: 8 (ref 7–16)
BUN: 24 mg/dL — AB (ref 7–18)
CALCIUM: 8.8 mg/dL (ref 8.5–10.1)
CHLORIDE: 95 mmol/L — AB (ref 98–107)
CO2: 21 mmol/L (ref 21–32)
Creatinine: 1.89 mg/dL — ABNORMAL HIGH (ref 0.60–1.30)
EGFR (African American): 27 — ABNORMAL LOW
EGFR (Non-African Amer.): 23 — ABNORMAL LOW
Glucose: 104 mg/dL — ABNORMAL HIGH (ref 65–99)
OSMOLALITY: 254 (ref 275–301)
Potassium: 4.5 mmol/L (ref 3.5–5.1)
Sodium: 124 mmol/L — ABNORMAL LOW (ref 136–145)

## 2014-07-29 DIAGNOSIS — E860 Dehydration: Secondary | ICD-10-CM | POA: Diagnosis not present

## 2014-07-29 DIAGNOSIS — I129 Hypertensive chronic kidney disease with stage 1 through stage 4 chronic kidney disease, or unspecified chronic kidney disease: Secondary | ICD-10-CM | POA: Diagnosis not present

## 2014-07-29 DIAGNOSIS — I2789 Other specified pulmonary heart diseases: Secondary | ICD-10-CM | POA: Diagnosis not present

## 2014-07-29 DIAGNOSIS — I509 Heart failure, unspecified: Secondary | ICD-10-CM | POA: Diagnosis not present

## 2014-07-29 DIAGNOSIS — R079 Chest pain, unspecified: Secondary | ICD-10-CM | POA: Diagnosis not present

## 2014-07-29 DIAGNOSIS — I10 Essential (primary) hypertension: Secondary | ICD-10-CM | POA: Diagnosis not present

## 2014-07-29 DIAGNOSIS — I4891 Unspecified atrial fibrillation: Secondary | ICD-10-CM | POA: Diagnosis not present

## 2014-07-29 DIAGNOSIS — N19 Unspecified kidney failure: Secondary | ICD-10-CM | POA: Diagnosis not present

## 2014-07-30 DIAGNOSIS — I2789 Other specified pulmonary heart diseases: Secondary | ICD-10-CM | POA: Diagnosis not present

## 2014-07-30 DIAGNOSIS — I10 Essential (primary) hypertension: Secondary | ICD-10-CM | POA: Diagnosis not present

## 2014-07-30 DIAGNOSIS — N19 Unspecified kidney failure: Secondary | ICD-10-CM | POA: Diagnosis not present

## 2014-07-30 DIAGNOSIS — I4891 Unspecified atrial fibrillation: Secondary | ICD-10-CM | POA: Diagnosis not present

## 2014-07-30 DIAGNOSIS — E860 Dehydration: Secondary | ICD-10-CM | POA: Diagnosis not present

## 2014-07-30 DIAGNOSIS — I509 Heart failure, unspecified: Secondary | ICD-10-CM | POA: Diagnosis not present

## 2014-07-30 DIAGNOSIS — J9819 Other pulmonary collapse: Secondary | ICD-10-CM | POA: Diagnosis not present

## 2014-07-30 DIAGNOSIS — I129 Hypertensive chronic kidney disease with stage 1 through stage 4 chronic kidney disease, or unspecified chronic kidney disease: Secondary | ICD-10-CM | POA: Diagnosis not present

## 2014-07-30 DIAGNOSIS — R0602 Shortness of breath: Secondary | ICD-10-CM | POA: Diagnosis not present

## 2014-07-30 DIAGNOSIS — R079 Chest pain, unspecified: Secondary | ICD-10-CM | POA: Diagnosis not present

## 2014-07-30 LAB — CBC WITH DIFFERENTIAL/PLATELET
Basophil #: 0 10*3/uL (ref 0.0–0.1)
Basophil %: 0.8 %
Eosinophil #: 0.3 10*3/uL (ref 0.0–0.7)
Eosinophil %: 7.9 %
HCT: 23 % — AB (ref 35.0–47.0)
HGB: 7.6 g/dL — AB (ref 12.0–16.0)
Lymphocyte #: 0.5 10*3/uL — ABNORMAL LOW (ref 1.0–3.6)
Lymphocyte %: 10.6 %
MCH: 29.2 pg (ref 26.0–34.0)
MCHC: 33.2 g/dL (ref 32.0–36.0)
MCV: 88 fL (ref 80–100)
Monocyte #: 0.5 x10 3/mm (ref 0.2–0.9)
Monocyte %: 12.1 %
NEUTROS PCT: 68.6 %
Neutrophil #: 3 10*3/uL (ref 1.4–6.5)
PLATELETS: 248 10*3/uL (ref 150–440)
RBC: 2.61 10*6/uL — AB (ref 3.80–5.20)
RDW: 13.6 % (ref 11.5–14.5)
WBC: 4.4 10*3/uL (ref 3.6–11.0)

## 2014-07-30 LAB — BASIC METABOLIC PANEL
Anion Gap: 7 (ref 7–16)
BUN: 28 mg/dL — ABNORMAL HIGH (ref 7–18)
CHLORIDE: 97 mmol/L — AB (ref 98–107)
Calcium, Total: 8.5 mg/dL (ref 8.5–10.1)
Co2: 21 mmol/L (ref 21–32)
Creatinine: 1.88 mg/dL — ABNORMAL HIGH (ref 0.60–1.30)
EGFR (African American): 27 — ABNORMAL LOW
GFR CALC NON AF AMER: 24 — AB
GLUCOSE: 89 mg/dL (ref 65–99)
Osmolality: 256 (ref 275–301)
Potassium: 4.9 mmol/L (ref 3.5–5.1)
Sodium: 125 mmol/L — ABNORMAL LOW (ref 136–145)

## 2014-07-31 DIAGNOSIS — I10 Essential (primary) hypertension: Secondary | ICD-10-CM | POA: Diagnosis not present

## 2014-07-31 DIAGNOSIS — E860 Dehydration: Secondary | ICD-10-CM | POA: Diagnosis not present

## 2014-07-31 DIAGNOSIS — N19 Unspecified kidney failure: Secondary | ICD-10-CM | POA: Diagnosis not present

## 2014-07-31 DIAGNOSIS — R079 Chest pain, unspecified: Secondary | ICD-10-CM | POA: Diagnosis not present

## 2014-07-31 LAB — CBC WITH DIFFERENTIAL/PLATELET
BASOS ABS: 0 10*3/uL (ref 0.0–0.1)
Basophil %: 0.1 %
EOS ABS: 0 10*3/uL (ref 0.0–0.7)
Eosinophil %: 0.1 %
HCT: 23.9 % — AB (ref 35.0–47.0)
HGB: 7.9 g/dL — ABNORMAL LOW (ref 12.0–16.0)
LYMPHS ABS: 0.3 10*3/uL — AB (ref 1.0–3.6)
LYMPHS PCT: 6.7 %
MCH: 29.2 pg (ref 26.0–34.0)
MCHC: 32.9 g/dL (ref 32.0–36.0)
MCV: 89 fL (ref 80–100)
MONO ABS: 0.2 x10 3/mm (ref 0.2–0.9)
Monocyte %: 3.8 %
Neutrophil #: 3.9 10*3/uL (ref 1.4–6.5)
Neutrophil %: 89.3 %
Platelet: 256 10*3/uL (ref 150–440)
RBC: 2.69 10*6/uL — AB (ref 3.80–5.20)
RDW: 13.7 % (ref 11.5–14.5)
WBC: 4.3 10*3/uL (ref 3.6–11.0)

## 2014-07-31 LAB — BASIC METABOLIC PANEL
Anion Gap: 9 (ref 7–16)
BUN: 29 mg/dL — AB (ref 7–18)
CHLORIDE: 98 mmol/L (ref 98–107)
Calcium, Total: 8.6 mg/dL (ref 8.5–10.1)
Co2: 21 mmol/L (ref 21–32)
Creatinine: 1.78 mg/dL — ABNORMAL HIGH (ref 0.60–1.30)
EGFR (Non-African Amer.): 25 — ABNORMAL LOW
GFR CALC AF AMER: 29 — AB
Glucose: 107 mg/dL — ABNORMAL HIGH (ref 65–99)
OSMOLALITY: 263 (ref 275–301)
Potassium: 5.1 mmol/L (ref 3.5–5.1)
Sodium: 128 mmol/L — ABNORMAL LOW (ref 136–145)

## 2014-08-01 DIAGNOSIS — R079 Chest pain, unspecified: Secondary | ICD-10-CM | POA: Diagnosis not present

## 2014-08-01 DIAGNOSIS — I509 Heart failure, unspecified: Secondary | ICD-10-CM | POA: Diagnosis not present

## 2014-08-01 DIAGNOSIS — R0602 Shortness of breath: Secondary | ICD-10-CM | POA: Diagnosis not present

## 2014-08-01 DIAGNOSIS — I10 Essential (primary) hypertension: Secondary | ICD-10-CM | POA: Diagnosis not present

## 2014-08-01 DIAGNOSIS — J90 Pleural effusion, not elsewhere classified: Secondary | ICD-10-CM | POA: Diagnosis not present

## 2014-08-01 DIAGNOSIS — E860 Dehydration: Secondary | ICD-10-CM | POA: Diagnosis not present

## 2014-08-01 DIAGNOSIS — N19 Unspecified kidney failure: Secondary | ICD-10-CM | POA: Diagnosis not present

## 2014-08-01 LAB — CBC WITH DIFFERENTIAL/PLATELET
BASOS PCT: 0.1 %
Basophil #: 0 10*3/uL (ref 0.0–0.1)
EOS ABS: 0 10*3/uL (ref 0.0–0.7)
Eosinophil %: 0 %
HCT: 24.2 % — ABNORMAL LOW (ref 35.0–47.0)
HGB: 8.1 g/dL — AB (ref 12.0–16.0)
LYMPHS ABS: 0.3 10*3/uL — AB (ref 1.0–3.6)
Lymphocyte %: 5.9 %
MCH: 29.7 pg (ref 26.0–34.0)
MCHC: 33.5 g/dL (ref 32.0–36.0)
MCV: 89 fL (ref 80–100)
Monocyte #: 0.4 x10 3/mm (ref 0.2–0.9)
Monocyte %: 8.2 %
Neutrophil #: 4.5 10*3/uL (ref 1.4–6.5)
Neutrophil %: 85.8 %
Platelet: 286 10*3/uL (ref 150–440)
RBC: 2.73 10*6/uL — AB (ref 3.80–5.20)
RDW: 13.7 % (ref 11.5–14.5)
WBC: 5.2 10*3/uL (ref 3.6–11.0)

## 2014-08-01 LAB — BASIC METABOLIC PANEL
Anion Gap: 9 (ref 7–16)
BUN: 33 mg/dL — ABNORMAL HIGH (ref 7–18)
CALCIUM: 8.3 mg/dL — AB (ref 8.5–10.1)
CO2: 22 mmol/L (ref 21–32)
Chloride: 96 mmol/L — ABNORMAL LOW (ref 98–107)
Creatinine: 1.85 mg/dL — ABNORMAL HIGH (ref 0.60–1.30)
EGFR (Non-African Amer.): 24 — ABNORMAL LOW
GFR CALC AF AMER: 28 — AB
GLUCOSE: 96 mg/dL (ref 65–99)
Osmolality: 262 (ref 275–301)
Potassium: 5.2 mmol/L — ABNORMAL HIGH (ref 3.5–5.1)
Sodium: 127 mmol/L — ABNORMAL LOW (ref 136–145)

## 2014-08-02 DIAGNOSIS — R079 Chest pain, unspecified: Secondary | ICD-10-CM | POA: Diagnosis not present

## 2014-08-02 DIAGNOSIS — E860 Dehydration: Secondary | ICD-10-CM | POA: Diagnosis not present

## 2014-08-02 DIAGNOSIS — I509 Heart failure, unspecified: Secondary | ICD-10-CM | POA: Diagnosis not present

## 2014-08-02 DIAGNOSIS — I10 Essential (primary) hypertension: Secondary | ICD-10-CM | POA: Diagnosis not present

## 2014-08-02 DIAGNOSIS — R0602 Shortness of breath: Secondary | ICD-10-CM | POA: Diagnosis not present

## 2014-08-02 DIAGNOSIS — N19 Unspecified kidney failure: Secondary | ICD-10-CM | POA: Diagnosis not present

## 2014-08-02 LAB — BASIC METABOLIC PANEL
ANION GAP: 8 (ref 7–16)
BUN: 42 mg/dL — AB (ref 7–18)
Calcium, Total: 8.5 mg/dL (ref 8.5–10.1)
Chloride: 94 mmol/L — ABNORMAL LOW (ref 98–107)
Co2: 22 mmol/L (ref 21–32)
Creatinine: 1.8 mg/dL — ABNORMAL HIGH (ref 0.60–1.30)
EGFR (African American): 29 — ABNORMAL LOW
EGFR (Non-African Amer.): 25 — ABNORMAL LOW
Glucose: 101 mg/dL — ABNORMAL HIGH (ref 65–99)
Osmolality: 260 (ref 275–301)
POTASSIUM: 5.4 mmol/L — AB (ref 3.5–5.1)
Sodium: 124 mmol/L — ABNORMAL LOW (ref 136–145)

## 2014-08-02 LAB — CBC WITH DIFFERENTIAL/PLATELET
Basophil #: 0 10*3/uL (ref 0.0–0.1)
Basophil %: 0.1 %
Eosinophil #: 0 10*3/uL (ref 0.0–0.7)
Eosinophil %: 0 %
HCT: 23.8 % — AB (ref 35.0–47.0)
HGB: 7.9 g/dL — AB (ref 12.0–16.0)
LYMPHS ABS: 0.3 10*3/uL — AB (ref 1.0–3.6)
LYMPHS PCT: 4.3 %
MCH: 29.4 pg (ref 26.0–34.0)
MCHC: 33.3 g/dL (ref 32.0–36.0)
MCV: 88 fL (ref 80–100)
Monocyte #: 0.5 x10 3/mm (ref 0.2–0.9)
Monocyte %: 7.6 %
NEUTROS PCT: 88 %
Neutrophil #: 5.2 10*3/uL (ref 1.4–6.5)
Platelet: 275 10*3/uL (ref 150–440)
RBC: 2.69 10*6/uL — ABNORMAL LOW (ref 3.80–5.20)
RDW: 13.5 % (ref 11.5–14.5)
WBC: 5.9 10*3/uL (ref 3.6–11.0)

## 2014-08-03 DIAGNOSIS — R079 Chest pain, unspecified: Secondary | ICD-10-CM | POA: Diagnosis not present

## 2014-08-03 DIAGNOSIS — E860 Dehydration: Secondary | ICD-10-CM | POA: Diagnosis not present

## 2014-08-03 DIAGNOSIS — I10 Essential (primary) hypertension: Secondary | ICD-10-CM | POA: Diagnosis not present

## 2014-08-03 DIAGNOSIS — N19 Unspecified kidney failure: Secondary | ICD-10-CM | POA: Diagnosis not present

## 2014-08-03 LAB — CBC WITH DIFFERENTIAL/PLATELET
BASOS ABS: 0 10*3/uL (ref 0.0–0.1)
Basophil %: 0.1 %
Eosinophil #: 0 10*3/uL (ref 0.0–0.7)
Eosinophil %: 0 %
HCT: 23 % — ABNORMAL LOW (ref 35.0–47.0)
HGB: 7.8 g/dL — ABNORMAL LOW (ref 12.0–16.0)
LYMPHS PCT: 7 %
Lymphocyte #: 0.4 10*3/uL — ABNORMAL LOW (ref 1.0–3.6)
MCH: 29.8 pg (ref 26.0–34.0)
MCHC: 33.9 g/dL (ref 32.0–36.0)
MCV: 88 fL (ref 80–100)
MONO ABS: 0.4 x10 3/mm (ref 0.2–0.9)
MONOS PCT: 8 %
NEUTROS ABS: 4.5 10*3/uL (ref 1.4–6.5)
Neutrophil %: 84.9 %
Platelet: 264 10*3/uL (ref 150–440)
RBC: 2.62 10*6/uL — AB (ref 3.80–5.20)
RDW: 13.5 % (ref 11.5–14.5)
WBC: 5.3 10*3/uL (ref 3.6–11.0)

## 2014-08-03 LAB — BASIC METABOLIC PANEL
ANION GAP: 11 (ref 7–16)
Anion Gap: 5 — ABNORMAL LOW (ref 7–16)
BUN: 41 mg/dL — AB (ref 7–18)
BUN: 42 mg/dL — ABNORMAL HIGH (ref 7–18)
CALCIUM: 8.4 mg/dL — AB (ref 8.5–10.1)
Calcium, Total: 8.1 mg/dL — ABNORMAL LOW (ref 8.5–10.1)
Chloride: 90 mmol/L — ABNORMAL LOW (ref 98–107)
Chloride: 91 mmol/L — ABNORMAL LOW (ref 98–107)
Co2: 27 mmol/L (ref 21–32)
Co2: 23 mmol/L (ref 21–32)
Creatinine: 2.08 mg/dL — ABNORMAL HIGH (ref 0.60–1.30)
Creatinine: 2.12 mg/dL — ABNORMAL HIGH (ref 0.60–1.30)
EGFR (African American): 24 — ABNORMAL LOW
EGFR (Non-African Amer.): 21 — ABNORMAL LOW
EGFR (Non-African Amer.): 20 — ABNORMAL LOW
GFR CALC AF AMER: 24 — AB
Glucose: 105 mg/dL — ABNORMAL HIGH (ref 65–99)
Glucose: 101 mg/dL — ABNORMAL HIGH (ref 65–99)
OSMOLALITY: 256 (ref 275–301)
OSMOLALITY: 262 (ref 275–301)
Potassium: 4.7 mmol/L (ref 3.5–5.1)
Potassium: 4.5 mmol/L (ref 3.5–5.1)
SODIUM: 122 mmol/L — AB (ref 136–145)
SODIUM: 125 mmol/L — AB (ref 136–145)

## 2014-08-04 DIAGNOSIS — R079 Chest pain, unspecified: Secondary | ICD-10-CM | POA: Diagnosis not present

## 2014-08-04 DIAGNOSIS — R609 Edema, unspecified: Secondary | ICD-10-CM | POA: Diagnosis not present

## 2014-08-04 DIAGNOSIS — N184 Chronic kidney disease, stage 4 (severe): Secondary | ICD-10-CM | POA: Diagnosis not present

## 2014-08-04 DIAGNOSIS — E860 Dehydration: Secondary | ICD-10-CM | POA: Diagnosis not present

## 2014-08-04 DIAGNOSIS — E871 Hypo-osmolality and hyponatremia: Secondary | ICD-10-CM | POA: Diagnosis not present

## 2014-08-04 DIAGNOSIS — I10 Essential (primary) hypertension: Secondary | ICD-10-CM | POA: Diagnosis not present

## 2014-08-04 DIAGNOSIS — N19 Unspecified kidney failure: Secondary | ICD-10-CM | POA: Diagnosis not present

## 2014-08-04 LAB — BASIC METABOLIC PANEL
Anion Gap: 6 — ABNORMAL LOW (ref 7–16)
BUN: 47 mg/dL — AB (ref 7–18)
CALCIUM: 9 mg/dL (ref 8.5–10.1)
CO2: 32 mmol/L (ref 21–32)
CREATININE: 2.19 mg/dL — AB (ref 0.60–1.30)
Chloride: 84 mmol/L — ABNORMAL LOW (ref 98–107)
EGFR (African American): 23 — ABNORMAL LOW
GFR CALC NON AF AMER: 20 — AB
Glucose: 93 mg/dL (ref 65–99)
Osmolality: 258 (ref 275–301)
POTASSIUM: 4.1 mmol/L (ref 3.5–5.1)
Sodium: 122 mmol/L — ABNORMAL LOW (ref 136–145)

## 2014-08-04 LAB — CBC WITH DIFFERENTIAL/PLATELET
Basophil #: 0 10*3/uL (ref 0.0–0.1)
Basophil %: 0.2 %
Eosinophil #: 0 10*3/uL (ref 0.0–0.7)
Eosinophil %: 0.2 %
HCT: 24.1 % — AB (ref 35.0–47.0)
HGB: 8.3 g/dL — ABNORMAL LOW (ref 12.0–16.0)
LYMPHS ABS: 0.5 10*3/uL — AB (ref 1.0–3.6)
Lymphocyte %: 5.6 %
MCH: 30.2 pg (ref 26.0–34.0)
MCHC: 34.3 g/dL (ref 32.0–36.0)
MCV: 88 fL (ref 80–100)
MONOS PCT: 9.7 %
Monocyte #: 0.9 x10 3/mm (ref 0.2–0.9)
Neutrophil #: 8 10*3/uL — ABNORMAL HIGH (ref 1.4–6.5)
Neutrophil %: 84.3 %
PLATELETS: 293 10*3/uL (ref 150–440)
RBC: 2.74 10*6/uL — AB (ref 3.80–5.20)
RDW: 13.4 % (ref 11.5–14.5)
WBC: 9.5 10*3/uL (ref 3.6–11.0)

## 2014-08-04 LAB — ALBUMIN: Albumin: 3 g/dL — ABNORMAL LOW (ref 3.4–5.0)

## 2014-08-04 LAB — TSH: THYROID STIMULATING HORM: 1.27 u[IU]/mL

## 2014-08-05 DIAGNOSIS — R609 Edema, unspecified: Secondary | ICD-10-CM | POA: Diagnosis not present

## 2014-08-05 DIAGNOSIS — I10 Essential (primary) hypertension: Secondary | ICD-10-CM | POA: Diagnosis not present

## 2014-08-05 DIAGNOSIS — R079 Chest pain, unspecified: Secondary | ICD-10-CM | POA: Diagnosis not present

## 2014-08-05 DIAGNOSIS — E860 Dehydration: Secondary | ICD-10-CM | POA: Diagnosis not present

## 2014-08-05 DIAGNOSIS — N19 Unspecified kidney failure: Secondary | ICD-10-CM | POA: Diagnosis not present

## 2014-08-05 DIAGNOSIS — N184 Chronic kidney disease, stage 4 (severe): Secondary | ICD-10-CM | POA: Diagnosis not present

## 2014-08-05 DIAGNOSIS — E871 Hypo-osmolality and hyponatremia: Secondary | ICD-10-CM | POA: Diagnosis not present

## 2014-08-05 LAB — CBC WITH DIFFERENTIAL/PLATELET
Basophil #: 0 10*3/uL (ref 0.0–0.1)
Basophil %: 0 %
EOS PCT: 0 %
Eosinophil #: 0 10*3/uL (ref 0.0–0.7)
HCT: 18.7 % — ABNORMAL LOW (ref 35.0–47.0)
HGB: 6.3 g/dL — AB (ref 12.0–16.0)
Lymphocyte #: 0.5 10*3/uL — ABNORMAL LOW (ref 1.0–3.6)
Lymphocyte %: 6.1 %
MCH: 29.7 pg (ref 26.0–34.0)
MCHC: 33.7 g/dL (ref 32.0–36.0)
MCV: 88 fL (ref 80–100)
MONO ABS: 0.7 x10 3/mm (ref 0.2–0.9)
MONOS PCT: 8.1 %
NEUTROS ABS: 7.7 10*3/uL — AB (ref 1.4–6.5)
Neutrophil %: 85.8 %
Platelet: 223 10*3/uL (ref 150–440)
RBC: 2.13 10*6/uL — AB (ref 3.80–5.20)
RDW: 13.4 % (ref 11.5–14.5)
WBC: 9 10*3/uL (ref 3.6–11.0)

## 2014-08-05 LAB — BASIC METABOLIC PANEL
Anion Gap: 9 (ref 7–16)
BUN: 54 mg/dL — ABNORMAL HIGH (ref 7–18)
CALCIUM: 8 mg/dL — AB (ref 8.5–10.1)
CO2: 30 mmol/L (ref 21–32)
Chloride: 84 mmol/L — ABNORMAL LOW (ref 98–107)
Creatinine: 2.25 mg/dL — ABNORMAL HIGH (ref 0.60–1.30)
EGFR (African American): 22 — ABNORMAL LOW
GFR CALC NON AF AMER: 19 — AB
Glucose: 92 mg/dL (ref 65–99)
OSMOLALITY: 262 (ref 275–301)
Potassium: 4.6 mmol/L (ref 3.5–5.1)
Sodium: 123 mmol/L — ABNORMAL LOW (ref 136–145)

## 2014-08-05 LAB — HEMOGLOBIN: HGB: 7.1 g/dL — ABNORMAL LOW (ref 12.0–16.0)

## 2014-08-06 DIAGNOSIS — R609 Edema, unspecified: Secondary | ICD-10-CM | POA: Diagnosis not present

## 2014-08-06 DIAGNOSIS — N19 Unspecified kidney failure: Secondary | ICD-10-CM | POA: Diagnosis not present

## 2014-08-06 DIAGNOSIS — N184 Chronic kidney disease, stage 4 (severe): Secondary | ICD-10-CM | POA: Diagnosis not present

## 2014-08-06 DIAGNOSIS — E871 Hypo-osmolality and hyponatremia: Secondary | ICD-10-CM | POA: Diagnosis not present

## 2014-08-06 DIAGNOSIS — R079 Chest pain, unspecified: Secondary | ICD-10-CM | POA: Diagnosis not present

## 2014-08-06 DIAGNOSIS — I10 Essential (primary) hypertension: Secondary | ICD-10-CM | POA: Diagnosis not present

## 2014-08-06 DIAGNOSIS — E860 Dehydration: Secondary | ICD-10-CM | POA: Diagnosis not present

## 2014-08-06 LAB — CBC WITH DIFFERENTIAL/PLATELET
Basophil #: 0 10*3/uL (ref 0.0–0.1)
Basophil %: 0.1 %
Eosinophil #: 0 10*3/uL (ref 0.0–0.7)
Eosinophil %: 0.2 %
HCT: 17.5 % — ABNORMAL LOW (ref 35.0–47.0)
HGB: 6.1 g/dL — ABNORMAL LOW (ref 12.0–16.0)
Lymphocyte #: 0.7 10*3/uL — ABNORMAL LOW (ref 1.0–3.6)
Lymphocyte %: 8 %
MCH: 30.5 pg (ref 26.0–34.0)
MCHC: 34.9 g/dL (ref 32.0–36.0)
MCV: 88 fL (ref 80–100)
MONO ABS: 0.7 x10 3/mm (ref 0.2–0.9)
Monocyte %: 8.6 %
NEUTROS PCT: 83.1 %
Neutrophil #: 7 10*3/uL — ABNORMAL HIGH (ref 1.4–6.5)
Platelet: 226 10*3/uL (ref 150–440)
RBC: 2 10*6/uL — ABNORMAL LOW (ref 3.80–5.20)
RDW: 13.6 % (ref 11.5–14.5)
WBC: 8.4 10*3/uL (ref 3.6–11.0)

## 2014-08-06 LAB — BASIC METABOLIC PANEL
Anion Gap: 9 (ref 7–16)
Anion Gap: 8 (ref 7–16)
BUN: 65 mg/dL — ABNORMAL HIGH (ref 7–18)
BUN: 66 mg/dL — ABNORMAL HIGH (ref 7–18)
CHLORIDE: 87 mmol/L — AB (ref 98–107)
CO2: 30 mmol/L (ref 21–32)
CO2: 30 mmol/L (ref 21–32)
CREATININE: 2.39 mg/dL — AB (ref 0.60–1.30)
Calcium, Total: 7.8 mg/dL — ABNORMAL LOW (ref 8.5–10.1)
Calcium, Total: 7.9 mg/dL — ABNORMAL LOW (ref 8.5–10.1)
Chloride: 86 mmol/L — ABNORMAL LOW (ref 98–107)
Creatinine: 2.38 mg/dL — ABNORMAL HIGH (ref 0.60–1.30)
EGFR (Non-African Amer.): 18 — ABNORMAL LOW
EGFR (Non-African Amer.): 18 — ABNORMAL LOW
GFR CALC AF AMER: 21 — AB
GFR CALC AF AMER: 20 — AB
GLUCOSE: 76 mg/dL (ref 65–99)
Glucose: 92 mg/dL (ref 65–99)
Osmolality: 270 (ref 275–301)
Osmolality: 269 (ref 275–301)
POTASSIUM: 4.7 mmol/L (ref 3.5–5.1)
Potassium: 4.9 mmol/L (ref 3.5–5.1)
SODIUM: 125 mmol/L — AB (ref 136–145)
Sodium: 125 mmol/L — ABNORMAL LOW (ref 136–145)

## 2014-08-06 LAB — HEMOGLOBIN: HGB: 7.6 g/dL — ABNORMAL LOW (ref 12.0–16.0)

## 2014-08-06 LAB — OSMOLALITY, URINE: Osmolality: 209 mOsm/kg

## 2014-08-07 DIAGNOSIS — E860 Dehydration: Secondary | ICD-10-CM | POA: Diagnosis not present

## 2014-08-07 DIAGNOSIS — R079 Chest pain, unspecified: Secondary | ICD-10-CM | POA: Diagnosis not present

## 2014-08-07 DIAGNOSIS — N19 Unspecified kidney failure: Secondary | ICD-10-CM | POA: Diagnosis not present

## 2014-08-07 DIAGNOSIS — I10 Essential (primary) hypertension: Secondary | ICD-10-CM | POA: Diagnosis not present

## 2014-08-07 LAB — BASIC METABOLIC PANEL
Anion Gap: 7 (ref 7–16)
BUN: 78 mg/dL — ABNORMAL HIGH (ref 7–18)
Calcium, Total: 8.4 mg/dL — ABNORMAL LOW (ref 8.5–10.1)
Chloride: 90 mmol/L — ABNORMAL LOW (ref 98–107)
Co2: 32 mmol/L (ref 21–32)
Creatinine: 2.34 mg/dL — ABNORMAL HIGH (ref 0.60–1.30)
EGFR (African American): 21 — ABNORMAL LOW
EGFR (Non-African Amer.): 18 — ABNORMAL LOW
GLUCOSE: 107 mg/dL — AB (ref 65–99)
OSMOLALITY: 283 (ref 275–301)
Potassium: 5 mmol/L (ref 3.5–5.1)
SODIUM: 129 mmol/L — AB (ref 136–145)

## 2014-08-07 LAB — CBC WITH DIFFERENTIAL/PLATELET
Basophil #: 0 10*3/uL (ref 0.0–0.1)
Basophil %: 0.1 %
EOS PCT: 0 %
Eosinophil #: 0 10*3/uL (ref 0.0–0.7)
HCT: 23.2 % — AB (ref 35.0–47.0)
HGB: 8 g/dL — AB (ref 12.0–16.0)
Lymphocyte #: 0.5 10*3/uL — ABNORMAL LOW (ref 1.0–3.6)
Lymphocyte %: 5.5 %
MCH: 29.3 pg (ref 26.0–34.0)
MCHC: 34.3 g/dL (ref 32.0–36.0)
MCV: 85 fL (ref 80–100)
MONOS PCT: 9.9 %
Monocyte #: 0.9 x10 3/mm (ref 0.2–0.9)
NEUTROS PCT: 84.5 %
Neutrophil #: 7.9 10*3/uL — ABNORMAL HIGH (ref 1.4–6.5)
Platelet: 233 10*3/uL (ref 150–440)
RBC: 2.71 10*6/uL — ABNORMAL LOW (ref 3.80–5.20)
RDW: 14.9 % — AB (ref 11.5–14.5)
WBC: 9.4 10*3/uL (ref 3.6–11.0)

## 2014-08-07 LAB — OCCULT BLOOD X 1 CARD TO LAB, STOOL: Occult Blood, Feces: NEGATIVE

## 2014-08-07 LAB — PROTEIN ELECTROPHORESIS(ARMC)

## 2014-08-08 DIAGNOSIS — E871 Hypo-osmolality and hyponatremia: Secondary | ICD-10-CM | POA: Diagnosis not present

## 2014-08-08 DIAGNOSIS — R079 Chest pain, unspecified: Secondary | ICD-10-CM | POA: Diagnosis not present

## 2014-08-08 DIAGNOSIS — I10 Essential (primary) hypertension: Secondary | ICD-10-CM | POA: Diagnosis not present

## 2014-08-08 DIAGNOSIS — N184 Chronic kidney disease, stage 4 (severe): Secondary | ICD-10-CM | POA: Diagnosis not present

## 2014-08-08 DIAGNOSIS — N19 Unspecified kidney failure: Secondary | ICD-10-CM | POA: Diagnosis not present

## 2014-08-08 DIAGNOSIS — E860 Dehydration: Secondary | ICD-10-CM | POA: Diagnosis not present

## 2014-08-08 DIAGNOSIS — R609 Edema, unspecified: Secondary | ICD-10-CM | POA: Diagnosis not present

## 2014-08-08 LAB — CBC WITH DIFFERENTIAL/PLATELET
Basophil #: 0 10*3/uL (ref 0.0–0.1)
Basophil %: 0.1 %
EOS ABS: 0 10*3/uL (ref 0.0–0.7)
EOS PCT: 0.5 %
HCT: 23.2 % — ABNORMAL LOW (ref 35.0–47.0)
HGB: 7.5 g/dL — AB (ref 12.0–16.0)
LYMPHS PCT: 8.9 %
Lymphocyte #: 0.7 10*3/uL — ABNORMAL LOW (ref 1.0–3.6)
MCH: 28.8 pg (ref 26.0–34.0)
MCHC: 32.4 g/dL (ref 32.0–36.0)
MCV: 89 fL (ref 80–100)
MONO ABS: 0.8 x10 3/mm (ref 0.2–0.9)
MONOS PCT: 10.2 %
Neutrophil #: 6 10*3/uL (ref 1.4–6.5)
Neutrophil %: 80.3 %
PLATELETS: 212 10*3/uL (ref 150–440)
RBC: 2.61 10*6/uL — ABNORMAL LOW (ref 3.80–5.20)
RDW: 15.1 % — AB (ref 11.5–14.5)
WBC: 7.5 10*3/uL (ref 3.6–11.0)

## 2014-08-08 LAB — BASIC METABOLIC PANEL
ANION GAP: 9 (ref 7–16)
BUN: 72 mg/dL — ABNORMAL HIGH (ref 7–18)
CHLORIDE: 93 mmol/L — AB (ref 98–107)
CO2: 29 mmol/L (ref 21–32)
CREATININE: 2.3 mg/dL — AB (ref 0.60–1.30)
Calcium, Total: 8.3 mg/dL — ABNORMAL LOW (ref 8.5–10.1)
EGFR (African American): 21 — ABNORMAL LOW
EGFR (Non-African Amer.): 18 — ABNORMAL LOW
Glucose: 80 mg/dL (ref 65–99)
Osmolality: 283 (ref 275–301)
POTASSIUM: 4.7 mmol/L (ref 3.5–5.1)
Sodium: 131 mmol/L — ABNORMAL LOW (ref 136–145)

## 2014-08-09 DIAGNOSIS — E860 Dehydration: Secondary | ICD-10-CM | POA: Diagnosis not present

## 2014-08-09 DIAGNOSIS — I10 Essential (primary) hypertension: Secondary | ICD-10-CM | POA: Diagnosis not present

## 2014-08-09 DIAGNOSIS — R609 Edema, unspecified: Secondary | ICD-10-CM | POA: Diagnosis not present

## 2014-08-09 DIAGNOSIS — E871 Hypo-osmolality and hyponatremia: Secondary | ICD-10-CM | POA: Diagnosis not present

## 2014-08-09 DIAGNOSIS — R079 Chest pain, unspecified: Secondary | ICD-10-CM | POA: Diagnosis not present

## 2014-08-09 DIAGNOSIS — N184 Chronic kidney disease, stage 4 (severe): Secondary | ICD-10-CM | POA: Diagnosis not present

## 2014-08-09 DIAGNOSIS — N19 Unspecified kidney failure: Secondary | ICD-10-CM | POA: Diagnosis not present

## 2014-08-09 LAB — CBC WITH DIFFERENTIAL/PLATELET
BASOS ABS: 0 10*3/uL (ref 0.0–0.1)
BASOS PCT: 0.2 %
Eosinophil #: 0 10*3/uL (ref 0.0–0.7)
Eosinophil %: 0.5 %
HCT: 23.5 % — ABNORMAL LOW (ref 35.0–47.0)
HGB: 7.8 g/dL — ABNORMAL LOW (ref 12.0–16.0)
Lymphocyte #: 0.6 10*3/uL — ABNORMAL LOW (ref 1.0–3.6)
Lymphocyte %: 7.2 %
MCH: 29.4 pg (ref 26.0–34.0)
MCHC: 33.4 g/dL (ref 32.0–36.0)
MCV: 88 fL (ref 80–100)
Monocyte #: 0.7 x10 3/mm (ref 0.2–0.9)
Monocyte %: 9 %
NEUTROS ABS: 6.8 10*3/uL — AB (ref 1.4–6.5)
Neutrophil %: 83.1 %
Platelet: 220 10*3/uL (ref 150–440)
RBC: 2.67 10*6/uL — AB (ref 3.80–5.20)
RDW: 15.4 % — ABNORMAL HIGH (ref 11.5–14.5)
WBC: 8.2 10*3/uL (ref 3.6–11.0)

## 2014-08-09 LAB — BASIC METABOLIC PANEL
Anion Gap: 10 (ref 7–16)
BUN: 78 mg/dL — ABNORMAL HIGH (ref 7–18)
CALCIUM: 8.5 mg/dL (ref 8.5–10.1)
CHLORIDE: 96 mmol/L — AB (ref 98–107)
CO2: 28 mmol/L (ref 21–32)
CREATININE: 2.31 mg/dL — AB (ref 0.60–1.30)
EGFR (African American): 21 — ABNORMAL LOW
GFR CALC NON AF AMER: 18 — AB
Glucose: 96 mg/dL (ref 65–99)
Osmolality: 291 (ref 275–301)
Potassium: 4.8 mmol/L (ref 3.5–5.1)
Sodium: 134 mmol/L — ABNORMAL LOW (ref 136–145)

## 2014-08-09 LAB — UR PROT ELECTROPHORESIS, URINE RANDOM

## 2014-08-10 DIAGNOSIS — N184 Chronic kidney disease, stage 4 (severe): Secondary | ICD-10-CM | POA: Diagnosis not present

## 2014-08-10 DIAGNOSIS — I10 Essential (primary) hypertension: Secondary | ICD-10-CM | POA: Diagnosis not present

## 2014-08-10 DIAGNOSIS — R079 Chest pain, unspecified: Secondary | ICD-10-CM | POA: Diagnosis not present

## 2014-08-10 DIAGNOSIS — E871 Hypo-osmolality and hyponatremia: Secondary | ICD-10-CM | POA: Diagnosis not present

## 2014-08-10 DIAGNOSIS — R609 Edema, unspecified: Secondary | ICD-10-CM | POA: Diagnosis not present

## 2014-08-10 DIAGNOSIS — N19 Unspecified kidney failure: Secondary | ICD-10-CM | POA: Diagnosis not present

## 2014-08-10 DIAGNOSIS — E860 Dehydration: Secondary | ICD-10-CM | POA: Diagnosis not present

## 2014-08-10 LAB — CBC WITH DIFFERENTIAL/PLATELET
Basophil #: 0 10*3/uL (ref 0.0–0.1)
Basophil %: 0.2 %
EOS ABS: 0.1 10*3/uL (ref 0.0–0.7)
Eosinophil %: 1.9 %
HCT: 23.8 % — AB (ref 35.0–47.0)
HGB: 7.8 g/dL — ABNORMAL LOW (ref 12.0–16.0)
Lymphocyte #: 0.6 10*3/uL — ABNORMAL LOW (ref 1.0–3.6)
Lymphocyte %: 8.4 %
MCH: 29.2 pg (ref 26.0–34.0)
MCHC: 32.5 g/dL (ref 32.0–36.0)
MCV: 90 fL (ref 80–100)
MONOS PCT: 9.9 %
Monocyte #: 0.8 x10 3/mm (ref 0.2–0.9)
NEUTROS ABS: 6.1 10*3/uL (ref 1.4–6.5)
NEUTROS PCT: 79.6 %
Platelet: 203 10*3/uL (ref 150–440)
RBC: 2.66 10*6/uL — ABNORMAL LOW (ref 3.80–5.20)
RDW: 15 % — ABNORMAL HIGH (ref 11.5–14.5)
WBC: 7.7 10*3/uL (ref 3.6–11.0)

## 2014-08-10 LAB — BASIC METABOLIC PANEL
ANION GAP: 10 (ref 7–16)
BUN: 82 mg/dL — ABNORMAL HIGH (ref 7–18)
CHLORIDE: 96 mmol/L — AB (ref 98–107)
CO2: 28 mmol/L (ref 21–32)
CREATININE: 2.41 mg/dL — AB (ref 0.60–1.30)
Calcium, Total: 8.5 mg/dL (ref 8.5–10.1)
EGFR (African American): 20 — ABNORMAL LOW
GFR CALC NON AF AMER: 17 — AB
Glucose: 84 mg/dL (ref 65–99)
OSMOLALITY: 292 (ref 275–301)
Potassium: 4.8 mmol/L (ref 3.5–5.1)
Sodium: 134 mmol/L — ABNORMAL LOW (ref 136–145)

## 2014-08-11 DIAGNOSIS — I10 Essential (primary) hypertension: Secondary | ICD-10-CM | POA: Diagnosis not present

## 2014-08-11 DIAGNOSIS — N179 Acute kidney failure, unspecified: Secondary | ICD-10-CM | POA: Diagnosis not present

## 2014-08-11 DIAGNOSIS — I509 Heart failure, unspecified: Secondary | ICD-10-CM | POA: Diagnosis not present

## 2014-08-11 DIAGNOSIS — R0602 Shortness of breath: Secondary | ICD-10-CM | POA: Diagnosis not present

## 2014-08-11 DIAGNOSIS — Z5189 Encounter for other specified aftercare: Secondary | ICD-10-CM | POA: Diagnosis not present

## 2014-08-11 DIAGNOSIS — K5900 Constipation, unspecified: Secondary | ICD-10-CM | POA: Diagnosis not present

## 2014-08-11 DIAGNOSIS — E871 Hypo-osmolality and hyponatremia: Secondary | ICD-10-CM | POA: Diagnosis not present

## 2014-08-11 DIAGNOSIS — Z880 Allergy status to penicillin: Secondary | ICD-10-CM | POA: Diagnosis not present

## 2014-08-11 DIAGNOSIS — R51 Headache: Secondary | ICD-10-CM | POA: Diagnosis not present

## 2014-08-11 DIAGNOSIS — D649 Anemia, unspecified: Secondary | ICD-10-CM | POA: Diagnosis not present

## 2014-08-11 DIAGNOSIS — I129 Hypertensive chronic kidney disease with stage 1 through stage 4 chronic kidney disease, or unspecified chronic kidney disease: Secondary | ICD-10-CM | POA: Diagnosis not present

## 2014-08-11 DIAGNOSIS — E860 Dehydration: Secondary | ICD-10-CM | POA: Diagnosis not present

## 2014-08-11 DIAGNOSIS — J811 Chronic pulmonary edema: Secondary | ICD-10-CM | POA: Diagnosis not present

## 2014-08-11 DIAGNOSIS — R609 Edema, unspecified: Secondary | ICD-10-CM | POA: Diagnosis not present

## 2014-08-11 DIAGNOSIS — N186 End stage renal disease: Secondary | ICD-10-CM | POA: Diagnosis not present

## 2014-08-11 DIAGNOSIS — N2581 Secondary hyperparathyroidism of renal origin: Secondary | ICD-10-CM | POA: Diagnosis not present

## 2014-08-11 DIAGNOSIS — E782 Mixed hyperlipidemia: Secondary | ICD-10-CM | POA: Diagnosis not present

## 2014-08-11 DIAGNOSIS — Z79899 Other long term (current) drug therapy: Secondary | ICD-10-CM | POA: Diagnosis not present

## 2014-08-11 DIAGNOSIS — R079 Chest pain, unspecified: Secondary | ICD-10-CM | POA: Diagnosis not present

## 2014-08-11 DIAGNOSIS — N19 Unspecified kidney failure: Secondary | ICD-10-CM | POA: Diagnosis not present

## 2014-08-11 DIAGNOSIS — K219 Gastro-esophageal reflux disease without esophagitis: Secondary | ICD-10-CM | POA: Diagnosis not present

## 2014-08-11 DIAGNOSIS — N184 Chronic kidney disease, stage 4 (severe): Secondary | ICD-10-CM | POA: Diagnosis not present

## 2014-08-11 DIAGNOSIS — N183 Chronic kidney disease, stage 3 unspecified: Secondary | ICD-10-CM | POA: Diagnosis not present

## 2014-08-11 DIAGNOSIS — M6281 Muscle weakness (generalized): Secondary | ICD-10-CM | POA: Diagnosis not present

## 2014-08-11 DIAGNOSIS — D509 Iron deficiency anemia, unspecified: Secondary | ICD-10-CM | POA: Diagnosis not present

## 2014-08-11 DIAGNOSIS — R5381 Other malaise: Secondary | ICD-10-CM | POA: Diagnosis not present

## 2014-08-11 DIAGNOSIS — M25529 Pain in unspecified elbow: Secondary | ICD-10-CM | POA: Diagnosis not present

## 2014-08-11 DIAGNOSIS — M25519 Pain in unspecified shoulder: Secondary | ICD-10-CM | POA: Diagnosis not present

## 2014-08-11 DIAGNOSIS — R062 Wheezing: Secondary | ICD-10-CM | POA: Diagnosis not present

## 2014-08-11 LAB — BASIC METABOLIC PANEL
Anion Gap: 7 (ref 7–16)
BUN: 79 mg/dL — AB (ref 7–18)
CALCIUM: 8.4 mg/dL — AB (ref 8.5–10.1)
CHLORIDE: 96 mmol/L — AB (ref 98–107)
Co2: 29 mmol/L (ref 21–32)
Creatinine: 2.36 mg/dL — ABNORMAL HIGH (ref 0.60–1.30)
EGFR (African American): 21 — ABNORMAL LOW
EGFR (Non-African Amer.): 18 — ABNORMAL LOW
Glucose: 87 mg/dL (ref 65–99)
Osmolality: 288 (ref 275–301)
Potassium: 4.5 mmol/L (ref 3.5–5.1)
SODIUM: 132 mmol/L — AB (ref 136–145)

## 2014-08-11 LAB — HEMOGLOBIN: HGB: 7.9 g/dL — AB (ref 12.0–16.0)

## 2014-08-15 ENCOUNTER — Emergency Department: Payer: Self-pay | Admitting: Emergency Medicine

## 2014-08-15 DIAGNOSIS — N183 Chronic kidney disease, stage 3 unspecified: Secondary | ICD-10-CM | POA: Diagnosis not present

## 2014-08-15 DIAGNOSIS — I509 Heart failure, unspecified: Secondary | ICD-10-CM | POA: Diagnosis not present

## 2014-08-15 DIAGNOSIS — N19 Unspecified kidney failure: Secondary | ICD-10-CM | POA: Diagnosis not present

## 2014-08-15 DIAGNOSIS — D649 Anemia, unspecified: Secondary | ICD-10-CM | POA: Diagnosis not present

## 2014-08-15 DIAGNOSIS — I10 Essential (primary) hypertension: Secondary | ICD-10-CM | POA: Diagnosis not present

## 2014-08-15 DIAGNOSIS — K219 Gastro-esophageal reflux disease without esophagitis: Secondary | ICD-10-CM | POA: Diagnosis not present

## 2014-08-15 DIAGNOSIS — N2581 Secondary hyperparathyroidism of renal origin: Secondary | ICD-10-CM | POA: Diagnosis not present

## 2014-08-15 DIAGNOSIS — E782 Mixed hyperlipidemia: Secondary | ICD-10-CM | POA: Diagnosis not present

## 2014-08-15 DIAGNOSIS — I129 Hypertensive chronic kidney disease with stage 1 through stage 4 chronic kidney disease, or unspecified chronic kidney disease: Secondary | ICD-10-CM | POA: Diagnosis not present

## 2014-08-15 DIAGNOSIS — N179 Acute kidney failure, unspecified: Secondary | ICD-10-CM | POA: Diagnosis not present

## 2014-08-15 DIAGNOSIS — E871 Hypo-osmolality and hyponatremia: Secondary | ICD-10-CM | POA: Diagnosis not present

## 2014-08-15 LAB — CBC
HCT: 23.7 % — ABNORMAL LOW (ref 35.0–47.0)
HGB: 7.8 g/dL — AB (ref 12.0–16.0)
MCH: 29.6 pg (ref 26.0–34.0)
MCHC: 33.1 g/dL (ref 32.0–36.0)
MCV: 90 fL (ref 80–100)
Platelet: 158 10*3/uL (ref 150–440)
RBC: 2.65 10*6/uL — ABNORMAL LOW (ref 3.80–5.20)
RDW: 15.4 % — ABNORMAL HIGH (ref 11.5–14.5)
WBC: 5.1 10*3/uL (ref 3.6–11.0)

## 2014-08-15 LAB — COMPREHENSIVE METABOLIC PANEL
ALBUMIN: 2.9 g/dL — AB (ref 3.4–5.0)
ANION GAP: 7 (ref 7–16)
AST: 16 U/L (ref 15–37)
Alkaline Phosphatase: 66 U/L
BILIRUBIN TOTAL: 0.6 mg/dL (ref 0.2–1.0)
BUN: 65 mg/dL — ABNORMAL HIGH (ref 7–18)
CALCIUM: 7.9 mg/dL — AB (ref 8.5–10.1)
CHLORIDE: 104 mmol/L (ref 98–107)
Co2: 28 mmol/L (ref 21–32)
Creatinine: 2.46 mg/dL — ABNORMAL HIGH (ref 0.60–1.30)
GFR CALC AF AMER: 24 — AB
GFR CALC NON AF AMER: 20 — AB
Glucose: 120 mg/dL — ABNORMAL HIGH (ref 65–99)
Osmolality: 297 (ref 275–301)
POTASSIUM: 3.7 mmol/L (ref 3.5–5.1)
SGPT (ALT): 25 U/L
Sodium: 139 mmol/L (ref 136–145)
Total Protein: 5.8 g/dL — ABNORMAL LOW (ref 6.4–8.2)

## 2014-08-19 DIAGNOSIS — M25519 Pain in unspecified shoulder: Secondary | ICD-10-CM | POA: Diagnosis not present

## 2014-08-19 DIAGNOSIS — N186 End stage renal disease: Secondary | ICD-10-CM | POA: Diagnosis not present

## 2014-08-19 DIAGNOSIS — I10 Essential (primary) hypertension: Secondary | ICD-10-CM | POA: Diagnosis not present

## 2014-08-19 DIAGNOSIS — R0602 Shortness of breath: Secondary | ICD-10-CM | POA: Diagnosis not present

## 2014-08-19 DIAGNOSIS — D649 Anemia, unspecified: Secondary | ICD-10-CM | POA: Diagnosis not present

## 2014-08-19 DIAGNOSIS — R062 Wheezing: Secondary | ICD-10-CM | POA: Diagnosis not present

## 2014-08-19 DIAGNOSIS — M25529 Pain in unspecified elbow: Secondary | ICD-10-CM | POA: Diagnosis not present

## 2014-09-01 DIAGNOSIS — I129 Hypertensive chronic kidney disease with stage 1 through stage 4 chronic kidney disease, or unspecified chronic kidney disease: Secondary | ICD-10-CM | POA: Diagnosis not present

## 2014-09-01 DIAGNOSIS — M150 Primary generalized (osteo)arthritis: Secondary | ICD-10-CM | POA: Diagnosis not present

## 2014-09-01 DIAGNOSIS — N189 Chronic kidney disease, unspecified: Secondary | ICD-10-CM | POA: Diagnosis not present

## 2014-09-01 DIAGNOSIS — K219 Gastro-esophageal reflux disease without esophagitis: Secondary | ICD-10-CM | POA: Diagnosis not present

## 2014-09-01 DIAGNOSIS — I509 Heart failure, unspecified: Secondary | ICD-10-CM | POA: Diagnosis not present

## 2014-09-01 DIAGNOSIS — J209 Acute bronchitis, unspecified: Secondary | ICD-10-CM | POA: Diagnosis not present

## 2014-09-01 DIAGNOSIS — D649 Anemia, unspecified: Secondary | ICD-10-CM | POA: Diagnosis not present

## 2014-09-02 ENCOUNTER — Inpatient Hospital Stay: Payer: Self-pay | Admitting: Family Medicine

## 2014-09-02 DIAGNOSIS — N2581 Secondary hyperparathyroidism of renal origin: Secondary | ICD-10-CM | POA: Diagnosis not present

## 2014-09-02 DIAGNOSIS — D631 Anemia in chronic kidney disease: Secondary | ICD-10-CM | POA: Diagnosis not present

## 2014-09-02 DIAGNOSIS — I129 Hypertensive chronic kidney disease with stage 1 through stage 4 chronic kidney disease, or unspecified chronic kidney disease: Secondary | ICD-10-CM | POA: Diagnosis present

## 2014-09-02 DIAGNOSIS — N179 Acute kidney failure, unspecified: Secondary | ICD-10-CM | POA: Diagnosis not present

## 2014-09-02 DIAGNOSIS — R0789 Other chest pain: Secondary | ICD-10-CM | POA: Diagnosis not present

## 2014-09-02 DIAGNOSIS — K219 Gastro-esophageal reflux disease without esophagitis: Secondary | ICD-10-CM | POA: Diagnosis present

## 2014-09-02 DIAGNOSIS — R079 Chest pain, unspecified: Secondary | ICD-10-CM | POA: Diagnosis not present

## 2014-09-02 DIAGNOSIS — I10 Essential (primary) hypertension: Secondary | ICD-10-CM | POA: Diagnosis not present

## 2014-09-02 DIAGNOSIS — I200 Unstable angina: Secondary | ICD-10-CM | POA: Diagnosis not present

## 2014-09-02 DIAGNOSIS — J811 Chronic pulmonary edema: Secondary | ICD-10-CM | POA: Diagnosis not present

## 2014-09-02 DIAGNOSIS — R778 Other specified abnormalities of plasma proteins: Secondary | ICD-10-CM | POA: Diagnosis not present

## 2014-09-02 DIAGNOSIS — I272 Other secondary pulmonary hypertension: Secondary | ICD-10-CM | POA: Diagnosis not present

## 2014-09-02 DIAGNOSIS — I5030 Unspecified diastolic (congestive) heart failure: Secondary | ICD-10-CM | POA: Diagnosis not present

## 2014-09-02 DIAGNOSIS — I517 Cardiomegaly: Secondary | ICD-10-CM | POA: Diagnosis not present

## 2014-09-02 DIAGNOSIS — E785 Hyperlipidemia, unspecified: Secondary | ICD-10-CM | POA: Diagnosis present

## 2014-09-02 DIAGNOSIS — R531 Weakness: Secondary | ICD-10-CM | POA: Diagnosis not present

## 2014-09-02 DIAGNOSIS — N183 Chronic kidney disease, stage 3 (moderate): Secondary | ICD-10-CM | POA: Diagnosis not present

## 2014-09-02 DIAGNOSIS — I071 Rheumatic tricuspid insufficiency: Secondary | ICD-10-CM | POA: Diagnosis present

## 2014-09-02 DIAGNOSIS — Z8249 Family history of ischemic heart disease and other diseases of the circulatory system: Secondary | ICD-10-CM | POA: Diagnosis not present

## 2014-09-02 DIAGNOSIS — I480 Paroxysmal atrial fibrillation: Secondary | ICD-10-CM | POA: Diagnosis not present

## 2014-09-02 DIAGNOSIS — I214 Non-ST elevation (NSTEMI) myocardial infarction: Secondary | ICD-10-CM | POA: Diagnosis not present

## 2014-09-02 DIAGNOSIS — M419 Scoliosis, unspecified: Secondary | ICD-10-CM | POA: Diagnosis present

## 2014-09-02 DIAGNOSIS — Z96653 Presence of artificial knee joint, bilateral: Secondary | ICD-10-CM | POA: Diagnosis present

## 2014-09-02 DIAGNOSIS — I25110 Atherosclerotic heart disease of native coronary artery with unstable angina pectoris: Secondary | ICD-10-CM | POA: Diagnosis present

## 2014-09-02 DIAGNOSIS — N189 Chronic kidney disease, unspecified: Secondary | ICD-10-CM | POA: Diagnosis not present

## 2014-09-02 DIAGNOSIS — I340 Nonrheumatic mitral (valve) insufficiency: Secondary | ICD-10-CM | POA: Diagnosis present

## 2014-09-02 DIAGNOSIS — F0390 Unspecified dementia without behavioral disturbance: Secondary | ICD-10-CM | POA: Diagnosis present

## 2014-09-02 DIAGNOSIS — M1990 Unspecified osteoarthritis, unspecified site: Secondary | ICD-10-CM | POA: Diagnosis present

## 2014-09-02 LAB — URINALYSIS, COMPLETE
BILIRUBIN, UR: NEGATIVE
Glucose,UR: NEGATIVE mg/dL (ref 0–75)
KETONE: NEGATIVE
Leukocyte Esterase: NEGATIVE
Nitrite: NEGATIVE
PH: 7 (ref 4.5–8.0)
Protein: 100
SQUAMOUS EPITHELIAL: NONE SEEN
Specific Gravity: 1.006 (ref 1.003–1.030)

## 2014-09-02 LAB — CBC
HCT: 29.3 % — AB (ref 35.0–47.0)
HGB: 9.3 g/dL — AB (ref 12.0–16.0)
MCH: 28.8 pg (ref 26.0–34.0)
MCHC: 31.8 g/dL — ABNORMAL LOW (ref 32.0–36.0)
MCV: 91 fL (ref 80–100)
Platelet: 236 10*3/uL (ref 150–440)
RBC: 3.24 10*6/uL — AB (ref 3.80–5.20)
RDW: 14.7 % — ABNORMAL HIGH (ref 11.5–14.5)
WBC: 4.8 10*3/uL (ref 3.6–11.0)

## 2014-09-02 LAB — BASIC METABOLIC PANEL
Anion Gap: 10 (ref 7–16)
BUN: 50 mg/dL — AB (ref 7–18)
CHLORIDE: 101 mmol/L (ref 98–107)
Calcium, Total: 8.8 mg/dL (ref 8.5–10.1)
Co2: 26 mmol/L (ref 21–32)
Creatinine: 2.58 mg/dL — ABNORMAL HIGH (ref 0.60–1.30)
EGFR (Non-African Amer.): 19 — ABNORMAL LOW
GFR CALC AF AMER: 23 — AB
GLUCOSE: 105 mg/dL — AB (ref 65–99)
Osmolality: 288 (ref 275–301)
POTASSIUM: 3.7 mmol/L (ref 3.5–5.1)
Sodium: 137 mmol/L (ref 136–145)

## 2014-09-02 LAB — TROPONIN I
TROPONIN-I: 7.9 ng/mL — AB
Troponin-I: 0.45 ng/mL — ABNORMAL HIGH
Troponin-I: 3.5 ng/mL — ABNORMAL HIGH

## 2014-09-02 LAB — PRO B NATRIURETIC PEPTIDE: B-Type Natriuretic Peptide: 6100 pg/mL — ABNORMAL HIGH (ref 0–450)

## 2014-09-02 LAB — CK-MB
CK-MB: 24.9 ng/mL — ABNORMAL HIGH (ref 0.5–3.6)
CK-MB: 38.5 ng/mL — ABNORMAL HIGH (ref 0.5–3.6)

## 2014-09-02 LAB — CK TOTAL AND CKMB (NOT AT ARMC)
CK, Total: 98 U/L
CK-MB: 6.6 ng/mL — AB (ref 0.5–3.6)

## 2014-09-03 LAB — HEMOGLOBIN A1C: Hemoglobin A1C: 5.1 % (ref 4.2–6.3)

## 2014-09-03 LAB — BASIC METABOLIC PANEL
Anion Gap: 7 (ref 7–16)
BUN: 40 mg/dL — ABNORMAL HIGH (ref 7–18)
CREATININE: 2.3 mg/dL — AB (ref 0.60–1.30)
Calcium, Total: 8.7 mg/dL (ref 8.5–10.1)
Chloride: 103 mmol/L (ref 98–107)
Co2: 26 mmol/L (ref 21–32)
EGFR (African American): 26 — ABNORMAL LOW
EGFR (Non-African Amer.): 21 — ABNORMAL LOW
Glucose: 97 mg/dL (ref 65–99)
Osmolality: 282 (ref 275–301)
Potassium: 4.5 mmol/L (ref 3.5–5.1)
Sodium: 136 mmol/L (ref 136–145)

## 2014-09-03 LAB — LIPID PANEL
Cholesterol: 137 mg/dL (ref 0–200)
HDL Cholesterol: 38 mg/dL — ABNORMAL LOW (ref 40–60)
Ldl Cholesterol, Calc: 78 mg/dL (ref 0–100)
Triglycerides: 106 mg/dL (ref 0–200)
VLDL Cholesterol, Calc: 21 mg/dL (ref 5–40)

## 2014-09-03 LAB — TSH: Thyroid Stimulating Horm: 1.73 u[IU]/mL

## 2014-09-04 LAB — BASIC METABOLIC PANEL
ANION GAP: 6 — AB (ref 7–16)
BUN: 38 mg/dL — ABNORMAL HIGH (ref 7–18)
Calcium, Total: 8.7 mg/dL (ref 8.5–10.1)
Chloride: 102 mmol/L (ref 98–107)
Co2: 29 mmol/L (ref 21–32)
Creatinine: 2.15 mg/dL — ABNORMAL HIGH (ref 0.60–1.30)
EGFR (Non-African Amer.): 23 — ABNORMAL LOW
GFR CALC AF AMER: 28 — AB
Glucose: 88 mg/dL (ref 65–99)
Osmolality: 282 (ref 275–301)
POTASSIUM: 4.1 mmol/L (ref 3.5–5.1)
Sodium: 137 mmol/L (ref 136–145)

## 2014-09-04 LAB — HEMOGLOBIN: HGB: 9 g/dL — AB (ref 12.0–16.0)

## 2014-09-05 LAB — BASIC METABOLIC PANEL
Anion Gap: 8 (ref 7–16)
BUN: 41 mg/dL — AB (ref 7–18)
CO2: 28 mmol/L (ref 21–32)
Calcium, Total: 8.6 mg/dL (ref 8.5–10.1)
Chloride: 101 mmol/L (ref 98–107)
Creatinine: 2.27 mg/dL — ABNORMAL HIGH (ref 0.60–1.30)
EGFR (Non-African Amer.): 22 — ABNORMAL LOW
GFR CALC AF AMER: 26 — AB
Glucose: 87 mg/dL (ref 65–99)
OSMOLALITY: 283 (ref 275–301)
POTASSIUM: 4.3 mmol/L (ref 3.5–5.1)
Sodium: 137 mmol/L (ref 136–145)

## 2014-09-05 LAB — PLATELET COUNT: Platelet: 209 10*3/uL (ref 150–440)

## 2014-09-06 DIAGNOSIS — K219 Gastro-esophageal reflux disease without esophagitis: Secondary | ICD-10-CM | POA: Diagnosis not present

## 2014-09-06 DIAGNOSIS — J209 Acute bronchitis, unspecified: Secondary | ICD-10-CM | POA: Diagnosis not present

## 2014-09-06 DIAGNOSIS — D649 Anemia, unspecified: Secondary | ICD-10-CM | POA: Diagnosis not present

## 2014-09-06 DIAGNOSIS — I129 Hypertensive chronic kidney disease with stage 1 through stage 4 chronic kidney disease, or unspecified chronic kidney disease: Secondary | ICD-10-CM | POA: Diagnosis not present

## 2014-09-06 DIAGNOSIS — I509 Heart failure, unspecified: Secondary | ICD-10-CM | POA: Diagnosis not present

## 2014-09-06 DIAGNOSIS — N189 Chronic kidney disease, unspecified: Secondary | ICD-10-CM | POA: Diagnosis not present

## 2014-09-07 DIAGNOSIS — N189 Chronic kidney disease, unspecified: Secondary | ICD-10-CM | POA: Diagnosis not present

## 2014-09-07 DIAGNOSIS — I129 Hypertensive chronic kidney disease with stage 1 through stage 4 chronic kidney disease, or unspecified chronic kidney disease: Secondary | ICD-10-CM | POA: Diagnosis not present

## 2014-09-07 DIAGNOSIS — I509 Heart failure, unspecified: Secondary | ICD-10-CM | POA: Diagnosis not present

## 2014-09-07 DIAGNOSIS — J209 Acute bronchitis, unspecified: Secondary | ICD-10-CM | POA: Diagnosis not present

## 2014-09-07 DIAGNOSIS — D649 Anemia, unspecified: Secondary | ICD-10-CM | POA: Diagnosis not present

## 2014-09-07 DIAGNOSIS — K219 Gastro-esophageal reflux disease without esophagitis: Secondary | ICD-10-CM | POA: Diagnosis not present

## 2014-09-08 DIAGNOSIS — N189 Chronic kidney disease, unspecified: Secondary | ICD-10-CM | POA: Diagnosis not present

## 2014-09-08 DIAGNOSIS — J209 Acute bronchitis, unspecified: Secondary | ICD-10-CM | POA: Diagnosis not present

## 2014-09-08 DIAGNOSIS — I129 Hypertensive chronic kidney disease with stage 1 through stage 4 chronic kidney disease, or unspecified chronic kidney disease: Secondary | ICD-10-CM | POA: Diagnosis not present

## 2014-09-08 DIAGNOSIS — K219 Gastro-esophageal reflux disease without esophagitis: Secondary | ICD-10-CM | POA: Diagnosis not present

## 2014-09-08 DIAGNOSIS — D649 Anemia, unspecified: Secondary | ICD-10-CM | POA: Diagnosis not present

## 2014-09-08 DIAGNOSIS — I509 Heart failure, unspecified: Secondary | ICD-10-CM | POA: Diagnosis not present

## 2014-09-09 DIAGNOSIS — D649 Anemia, unspecified: Secondary | ICD-10-CM | POA: Diagnosis not present

## 2014-09-09 DIAGNOSIS — K219 Gastro-esophageal reflux disease without esophagitis: Secondary | ICD-10-CM | POA: Diagnosis not present

## 2014-09-09 DIAGNOSIS — J209 Acute bronchitis, unspecified: Secondary | ICD-10-CM | POA: Diagnosis not present

## 2014-09-09 DIAGNOSIS — I509 Heart failure, unspecified: Secondary | ICD-10-CM | POA: Diagnosis not present

## 2014-09-09 DIAGNOSIS — I129 Hypertensive chronic kidney disease with stage 1 through stage 4 chronic kidney disease, or unspecified chronic kidney disease: Secondary | ICD-10-CM | POA: Diagnosis not present

## 2014-09-09 DIAGNOSIS — N189 Chronic kidney disease, unspecified: Secondary | ICD-10-CM | POA: Diagnosis not present

## 2014-09-10 DIAGNOSIS — N189 Chronic kidney disease, unspecified: Secondary | ICD-10-CM | POA: Diagnosis not present

## 2014-09-10 DIAGNOSIS — I509 Heart failure, unspecified: Secondary | ICD-10-CM | POA: Diagnosis not present

## 2014-09-10 DIAGNOSIS — K219 Gastro-esophageal reflux disease without esophagitis: Secondary | ICD-10-CM | POA: Diagnosis not present

## 2014-09-10 DIAGNOSIS — D649 Anemia, unspecified: Secondary | ICD-10-CM | POA: Diagnosis not present

## 2014-09-10 DIAGNOSIS — I129 Hypertensive chronic kidney disease with stage 1 through stage 4 chronic kidney disease, or unspecified chronic kidney disease: Secondary | ICD-10-CM | POA: Diagnosis not present

## 2014-09-10 DIAGNOSIS — J209 Acute bronchitis, unspecified: Secondary | ICD-10-CM | POA: Diagnosis not present

## 2014-09-11 DIAGNOSIS — K219 Gastro-esophageal reflux disease without esophagitis: Secondary | ICD-10-CM | POA: Diagnosis not present

## 2014-09-11 DIAGNOSIS — I129 Hypertensive chronic kidney disease with stage 1 through stage 4 chronic kidney disease, or unspecified chronic kidney disease: Secondary | ICD-10-CM | POA: Diagnosis not present

## 2014-09-11 DIAGNOSIS — J209 Acute bronchitis, unspecified: Secondary | ICD-10-CM | POA: Diagnosis not present

## 2014-09-11 DIAGNOSIS — I509 Heart failure, unspecified: Secondary | ICD-10-CM | POA: Diagnosis not present

## 2014-09-11 DIAGNOSIS — D649 Anemia, unspecified: Secondary | ICD-10-CM | POA: Diagnosis not present

## 2014-09-11 DIAGNOSIS — N189 Chronic kidney disease, unspecified: Secondary | ICD-10-CM | POA: Diagnosis not present

## 2014-09-12 DIAGNOSIS — N189 Chronic kidney disease, unspecified: Secondary | ICD-10-CM | POA: Diagnosis not present

## 2014-09-12 DIAGNOSIS — K219 Gastro-esophageal reflux disease without esophagitis: Secondary | ICD-10-CM | POA: Diagnosis not present

## 2014-09-12 DIAGNOSIS — J209 Acute bronchitis, unspecified: Secondary | ICD-10-CM | POA: Diagnosis not present

## 2014-09-12 DIAGNOSIS — I129 Hypertensive chronic kidney disease with stage 1 through stage 4 chronic kidney disease, or unspecified chronic kidney disease: Secondary | ICD-10-CM | POA: Diagnosis not present

## 2014-09-12 DIAGNOSIS — I509 Heart failure, unspecified: Secondary | ICD-10-CM | POA: Diagnosis not present

## 2014-09-12 DIAGNOSIS — D649 Anemia, unspecified: Secondary | ICD-10-CM | POA: Diagnosis not present

## 2014-09-15 DIAGNOSIS — N2581 Secondary hyperparathyroidism of renal origin: Secondary | ICD-10-CM | POA: Diagnosis not present

## 2014-09-15 DIAGNOSIS — D631 Anemia in chronic kidney disease: Secondary | ICD-10-CM | POA: Diagnosis not present

## 2014-09-15 DIAGNOSIS — E871 Hypo-osmolality and hyponatremia: Secondary | ICD-10-CM | POA: Diagnosis not present

## 2014-09-15 DIAGNOSIS — I10 Essential (primary) hypertension: Secondary | ICD-10-CM | POA: Diagnosis not present

## 2014-09-15 DIAGNOSIS — R0602 Shortness of breath: Secondary | ICD-10-CM | POA: Diagnosis not present

## 2014-09-15 DIAGNOSIS — N184 Chronic kidney disease, stage 4 (severe): Secondary | ICD-10-CM | POA: Diagnosis not present

## 2014-09-16 DIAGNOSIS — K219 Gastro-esophageal reflux disease without esophagitis: Secondary | ICD-10-CM | POA: Diagnosis not present

## 2014-09-16 DIAGNOSIS — I509 Heart failure, unspecified: Secondary | ICD-10-CM | POA: Diagnosis not present

## 2014-09-16 DIAGNOSIS — J209 Acute bronchitis, unspecified: Secondary | ICD-10-CM | POA: Diagnosis not present

## 2014-09-16 DIAGNOSIS — I129 Hypertensive chronic kidney disease with stage 1 through stage 4 chronic kidney disease, or unspecified chronic kidney disease: Secondary | ICD-10-CM | POA: Diagnosis not present

## 2014-09-16 DIAGNOSIS — N189 Chronic kidney disease, unspecified: Secondary | ICD-10-CM | POA: Diagnosis not present

## 2014-09-16 DIAGNOSIS — D649 Anemia, unspecified: Secondary | ICD-10-CM | POA: Diagnosis not present

## 2014-09-18 DIAGNOSIS — D649 Anemia, unspecified: Secondary | ICD-10-CM | POA: Diagnosis not present

## 2014-09-18 DIAGNOSIS — J209 Acute bronchitis, unspecified: Secondary | ICD-10-CM | POA: Diagnosis not present

## 2014-09-18 DIAGNOSIS — K219 Gastro-esophageal reflux disease without esophagitis: Secondary | ICD-10-CM | POA: Diagnosis not present

## 2014-09-18 DIAGNOSIS — I509 Heart failure, unspecified: Secondary | ICD-10-CM | POA: Diagnosis not present

## 2014-09-18 DIAGNOSIS — I129 Hypertensive chronic kidney disease with stage 1 through stage 4 chronic kidney disease, or unspecified chronic kidney disease: Secondary | ICD-10-CM | POA: Diagnosis not present

## 2014-09-18 DIAGNOSIS — N189 Chronic kidney disease, unspecified: Secondary | ICD-10-CM | POA: Diagnosis not present

## 2014-09-22 DIAGNOSIS — K219 Gastro-esophageal reflux disease without esophagitis: Secondary | ICD-10-CM | POA: Diagnosis not present

## 2014-09-22 DIAGNOSIS — I509 Heart failure, unspecified: Secondary | ICD-10-CM | POA: Diagnosis not present

## 2014-09-22 DIAGNOSIS — J209 Acute bronchitis, unspecified: Secondary | ICD-10-CM | POA: Diagnosis not present

## 2014-09-22 DIAGNOSIS — D649 Anemia, unspecified: Secondary | ICD-10-CM | POA: Diagnosis not present

## 2014-09-22 DIAGNOSIS — I129 Hypertensive chronic kidney disease with stage 1 through stage 4 chronic kidney disease, or unspecified chronic kidney disease: Secondary | ICD-10-CM | POA: Diagnosis not present

## 2014-09-22 DIAGNOSIS — N189 Chronic kidney disease, unspecified: Secondary | ICD-10-CM | POA: Diagnosis not present

## 2014-09-23 DIAGNOSIS — J209 Acute bronchitis, unspecified: Secondary | ICD-10-CM | POA: Diagnosis not present

## 2014-09-23 DIAGNOSIS — D649 Anemia, unspecified: Secondary | ICD-10-CM | POA: Diagnosis not present

## 2014-09-23 DIAGNOSIS — I129 Hypertensive chronic kidney disease with stage 1 through stage 4 chronic kidney disease, or unspecified chronic kidney disease: Secondary | ICD-10-CM | POA: Diagnosis not present

## 2014-09-23 DIAGNOSIS — I509 Heart failure, unspecified: Secondary | ICD-10-CM | POA: Diagnosis not present

## 2014-09-23 DIAGNOSIS — K219 Gastro-esophageal reflux disease without esophagitis: Secondary | ICD-10-CM | POA: Diagnosis not present

## 2014-09-23 DIAGNOSIS — N189 Chronic kidney disease, unspecified: Secondary | ICD-10-CM | POA: Diagnosis not present

## 2014-09-25 DIAGNOSIS — I129 Hypertensive chronic kidney disease with stage 1 through stage 4 chronic kidney disease, or unspecified chronic kidney disease: Secondary | ICD-10-CM | POA: Diagnosis not present

## 2014-09-25 DIAGNOSIS — N189 Chronic kidney disease, unspecified: Secondary | ICD-10-CM | POA: Diagnosis not present

## 2014-09-25 DIAGNOSIS — J209 Acute bronchitis, unspecified: Secondary | ICD-10-CM | POA: Diagnosis not present

## 2014-09-25 DIAGNOSIS — I509 Heart failure, unspecified: Secondary | ICD-10-CM | POA: Diagnosis not present

## 2014-09-25 DIAGNOSIS — D649 Anemia, unspecified: Secondary | ICD-10-CM | POA: Diagnosis not present

## 2014-09-25 DIAGNOSIS — K219 Gastro-esophageal reflux disease without esophagitis: Secondary | ICD-10-CM | POA: Diagnosis not present

## 2014-09-29 DIAGNOSIS — J209 Acute bronchitis, unspecified: Secondary | ICD-10-CM | POA: Diagnosis not present

## 2014-09-29 DIAGNOSIS — D649 Anemia, unspecified: Secondary | ICD-10-CM | POA: Diagnosis not present

## 2014-09-29 DIAGNOSIS — I129 Hypertensive chronic kidney disease with stage 1 through stage 4 chronic kidney disease, or unspecified chronic kidney disease: Secondary | ICD-10-CM | POA: Diagnosis not present

## 2014-09-29 DIAGNOSIS — K219 Gastro-esophageal reflux disease without esophagitis: Secondary | ICD-10-CM | POA: Diagnosis not present

## 2014-09-29 DIAGNOSIS — N189 Chronic kidney disease, unspecified: Secondary | ICD-10-CM | POA: Diagnosis not present

## 2014-09-29 DIAGNOSIS — I509 Heart failure, unspecified: Secondary | ICD-10-CM | POA: Diagnosis not present

## 2014-09-30 DIAGNOSIS — I509 Heart failure, unspecified: Secondary | ICD-10-CM | POA: Diagnosis not present

## 2014-09-30 DIAGNOSIS — K219 Gastro-esophageal reflux disease without esophagitis: Secondary | ICD-10-CM | POA: Diagnosis not present

## 2014-09-30 DIAGNOSIS — D649 Anemia, unspecified: Secondary | ICD-10-CM | POA: Diagnosis not present

## 2014-09-30 DIAGNOSIS — J209 Acute bronchitis, unspecified: Secondary | ICD-10-CM | POA: Diagnosis not present

## 2014-09-30 DIAGNOSIS — N189 Chronic kidney disease, unspecified: Secondary | ICD-10-CM | POA: Diagnosis not present

## 2014-09-30 DIAGNOSIS — I129 Hypertensive chronic kidney disease with stage 1 through stage 4 chronic kidney disease, or unspecified chronic kidney disease: Secondary | ICD-10-CM | POA: Diagnosis not present

## 2014-10-02 DIAGNOSIS — N189 Chronic kidney disease, unspecified: Secondary | ICD-10-CM | POA: Diagnosis not present

## 2014-10-02 DIAGNOSIS — D649 Anemia, unspecified: Secondary | ICD-10-CM | POA: Diagnosis not present

## 2014-10-02 DIAGNOSIS — K219 Gastro-esophageal reflux disease without esophagitis: Secondary | ICD-10-CM | POA: Diagnosis not present

## 2014-10-02 DIAGNOSIS — I129 Hypertensive chronic kidney disease with stage 1 through stage 4 chronic kidney disease, or unspecified chronic kidney disease: Secondary | ICD-10-CM | POA: Diagnosis not present

## 2014-10-02 DIAGNOSIS — J209 Acute bronchitis, unspecified: Secondary | ICD-10-CM | POA: Diagnosis not present

## 2014-10-02 DIAGNOSIS — I509 Heart failure, unspecified: Secondary | ICD-10-CM | POA: Diagnosis not present

## 2014-10-05 DIAGNOSIS — N179 Acute kidney failure, unspecified: Secondary | ICD-10-CM | POA: Diagnosis not present

## 2014-10-05 DIAGNOSIS — I10 Essential (primary) hypertension: Secondary | ICD-10-CM | POA: Diagnosis not present

## 2014-10-05 DIAGNOSIS — N189 Chronic kidney disease, unspecified: Secondary | ICD-10-CM | POA: Diagnosis not present

## 2014-10-05 DIAGNOSIS — I252 Old myocardial infarction: Secondary | ICD-10-CM | POA: Diagnosis not present

## 2014-10-07 DIAGNOSIS — J209 Acute bronchitis, unspecified: Secondary | ICD-10-CM | POA: Diagnosis not present

## 2014-10-07 DIAGNOSIS — D649 Anemia, unspecified: Secondary | ICD-10-CM | POA: Diagnosis not present

## 2014-10-07 DIAGNOSIS — I509 Heart failure, unspecified: Secondary | ICD-10-CM | POA: Diagnosis not present

## 2014-10-07 DIAGNOSIS — I129 Hypertensive chronic kidney disease with stage 1 through stage 4 chronic kidney disease, or unspecified chronic kidney disease: Secondary | ICD-10-CM | POA: Diagnosis not present

## 2014-10-07 DIAGNOSIS — K219 Gastro-esophageal reflux disease without esophagitis: Secondary | ICD-10-CM | POA: Diagnosis not present

## 2014-10-07 DIAGNOSIS — N189 Chronic kidney disease, unspecified: Secondary | ICD-10-CM | POA: Diagnosis not present

## 2014-10-13 DIAGNOSIS — N184 Chronic kidney disease, stage 4 (severe): Secondary | ICD-10-CM | POA: Diagnosis not present

## 2014-10-13 DIAGNOSIS — M159 Polyosteoarthritis, unspecified: Secondary | ICD-10-CM | POA: Diagnosis not present

## 2014-10-13 DIAGNOSIS — D631 Anemia in chronic kidney disease: Secondary | ICD-10-CM | POA: Diagnosis not present

## 2014-10-13 DIAGNOSIS — I129 Hypertensive chronic kidney disease with stage 1 through stage 4 chronic kidney disease, or unspecified chronic kidney disease: Secondary | ICD-10-CM | POA: Diagnosis not present

## 2014-10-13 DIAGNOSIS — E871 Hypo-osmolality and hyponatremia: Secondary | ICD-10-CM | POA: Diagnosis not present

## 2014-10-13 DIAGNOSIS — I480 Paroxysmal atrial fibrillation: Secondary | ICD-10-CM | POA: Diagnosis not present

## 2014-10-13 DIAGNOSIS — N2581 Secondary hyperparathyroidism of renal origin: Secondary | ICD-10-CM | POA: Diagnosis not present

## 2014-10-13 DIAGNOSIS — I209 Angina pectoris, unspecified: Secondary | ICD-10-CM | POA: Diagnosis not present

## 2014-10-13 DIAGNOSIS — K219 Gastro-esophageal reflux disease without esophagitis: Secondary | ICD-10-CM | POA: Diagnosis not present

## 2014-10-15 DIAGNOSIS — I509 Heart failure, unspecified: Secondary | ICD-10-CM | POA: Diagnosis not present

## 2014-10-15 DIAGNOSIS — D649 Anemia, unspecified: Secondary | ICD-10-CM | POA: Diagnosis not present

## 2014-10-15 DIAGNOSIS — K219 Gastro-esophageal reflux disease without esophagitis: Secondary | ICD-10-CM | POA: Diagnosis not present

## 2014-10-15 DIAGNOSIS — N189 Chronic kidney disease, unspecified: Secondary | ICD-10-CM | POA: Diagnosis not present

## 2014-10-15 DIAGNOSIS — J209 Acute bronchitis, unspecified: Secondary | ICD-10-CM | POA: Diagnosis not present

## 2014-10-15 DIAGNOSIS — I129 Hypertensive chronic kidney disease with stage 1 through stage 4 chronic kidney disease, or unspecified chronic kidney disease: Secondary | ICD-10-CM | POA: Diagnosis not present

## 2014-10-20 DIAGNOSIS — J209 Acute bronchitis, unspecified: Secondary | ICD-10-CM | POA: Diagnosis not present

## 2014-10-20 DIAGNOSIS — K219 Gastro-esophageal reflux disease without esophagitis: Secondary | ICD-10-CM | POA: Diagnosis not present

## 2014-10-20 DIAGNOSIS — I509 Heart failure, unspecified: Secondary | ICD-10-CM | POA: Diagnosis not present

## 2014-10-20 DIAGNOSIS — D649 Anemia, unspecified: Secondary | ICD-10-CM | POA: Diagnosis not present

## 2014-10-20 DIAGNOSIS — N189 Chronic kidney disease, unspecified: Secondary | ICD-10-CM | POA: Diagnosis not present

## 2014-10-20 DIAGNOSIS — I129 Hypertensive chronic kidney disease with stage 1 through stage 4 chronic kidney disease, or unspecified chronic kidney disease: Secondary | ICD-10-CM | POA: Diagnosis not present

## 2014-10-28 DIAGNOSIS — K219 Gastro-esophageal reflux disease without esophagitis: Secondary | ICD-10-CM | POA: Diagnosis not present

## 2014-10-28 DIAGNOSIS — D649 Anemia, unspecified: Secondary | ICD-10-CM | POA: Diagnosis not present

## 2014-10-28 DIAGNOSIS — J209 Acute bronchitis, unspecified: Secondary | ICD-10-CM | POA: Diagnosis not present

## 2014-10-28 DIAGNOSIS — I129 Hypertensive chronic kidney disease with stage 1 through stage 4 chronic kidney disease, or unspecified chronic kidney disease: Secondary | ICD-10-CM | POA: Diagnosis not present

## 2014-10-28 DIAGNOSIS — I509 Heart failure, unspecified: Secondary | ICD-10-CM | POA: Diagnosis not present

## 2014-10-28 DIAGNOSIS — N189 Chronic kidney disease, unspecified: Secondary | ICD-10-CM | POA: Diagnosis not present

## 2014-10-29 DIAGNOSIS — N189 Chronic kidney disease, unspecified: Secondary | ICD-10-CM | POA: Diagnosis not present

## 2014-10-29 DIAGNOSIS — K219 Gastro-esophageal reflux disease without esophagitis: Secondary | ICD-10-CM | POA: Diagnosis not present

## 2014-10-29 DIAGNOSIS — D649 Anemia, unspecified: Secondary | ICD-10-CM | POA: Diagnosis not present

## 2014-10-29 DIAGNOSIS — J209 Acute bronchitis, unspecified: Secondary | ICD-10-CM | POA: Diagnosis not present

## 2014-10-29 DIAGNOSIS — I129 Hypertensive chronic kidney disease with stage 1 through stage 4 chronic kidney disease, or unspecified chronic kidney disease: Secondary | ICD-10-CM | POA: Diagnosis not present

## 2014-10-29 DIAGNOSIS — I509 Heart failure, unspecified: Secondary | ICD-10-CM | POA: Diagnosis not present

## 2014-10-31 DIAGNOSIS — D649 Anemia, unspecified: Secondary | ICD-10-CM | POA: Diagnosis not present

## 2014-10-31 DIAGNOSIS — I509 Heart failure, unspecified: Secondary | ICD-10-CM | POA: Diagnosis not present

## 2014-10-31 DIAGNOSIS — M150 Primary generalized (osteo)arthritis: Secondary | ICD-10-CM | POA: Diagnosis not present

## 2014-10-31 DIAGNOSIS — N189 Chronic kidney disease, unspecified: Secondary | ICD-10-CM | POA: Diagnosis not present

## 2014-10-31 DIAGNOSIS — K219 Gastro-esophageal reflux disease without esophagitis: Secondary | ICD-10-CM | POA: Diagnosis not present

## 2014-10-31 DIAGNOSIS — I214 Non-ST elevation (NSTEMI) myocardial infarction: Secondary | ICD-10-CM | POA: Diagnosis not present

## 2014-10-31 DIAGNOSIS — I129 Hypertensive chronic kidney disease with stage 1 through stage 4 chronic kidney disease, or unspecified chronic kidney disease: Secondary | ICD-10-CM | POA: Diagnosis not present

## 2014-11-03 DIAGNOSIS — H43813 Vitreous degeneration, bilateral: Secondary | ICD-10-CM | POA: Diagnosis not present

## 2014-11-03 DIAGNOSIS — H3532 Exudative age-related macular degeneration: Secondary | ICD-10-CM | POA: Diagnosis not present

## 2014-11-04 DIAGNOSIS — K219 Gastro-esophageal reflux disease without esophagitis: Secondary | ICD-10-CM | POA: Diagnosis not present

## 2014-11-04 DIAGNOSIS — D649 Anemia, unspecified: Secondary | ICD-10-CM | POA: Diagnosis not present

## 2014-11-04 DIAGNOSIS — N183 Chronic kidney disease, stage 3 (moderate): Secondary | ICD-10-CM | POA: Diagnosis not present

## 2014-11-04 DIAGNOSIS — I10 Essential (primary) hypertension: Secondary | ICD-10-CM | POA: Diagnosis not present

## 2014-11-04 DIAGNOSIS — I214 Non-ST elevation (NSTEMI) myocardial infarction: Secondary | ICD-10-CM | POA: Diagnosis not present

## 2014-11-04 DIAGNOSIS — N189 Chronic kidney disease, unspecified: Secondary | ICD-10-CM | POA: Diagnosis not present

## 2014-11-04 DIAGNOSIS — I129 Hypertensive chronic kidney disease with stage 1 through stage 4 chronic kidney disease, or unspecified chronic kidney disease: Secondary | ICD-10-CM | POA: Diagnosis not present

## 2014-11-04 DIAGNOSIS — I509 Heart failure, unspecified: Secondary | ICD-10-CM | POA: Diagnosis not present

## 2014-11-04 DIAGNOSIS — E785 Hyperlipidemia, unspecified: Secondary | ICD-10-CM | POA: Diagnosis not present

## 2014-11-05 DIAGNOSIS — D649 Anemia, unspecified: Secondary | ICD-10-CM | POA: Diagnosis not present

## 2014-11-05 DIAGNOSIS — I509 Heart failure, unspecified: Secondary | ICD-10-CM | POA: Diagnosis not present

## 2014-11-05 DIAGNOSIS — I214 Non-ST elevation (NSTEMI) myocardial infarction: Secondary | ICD-10-CM | POA: Diagnosis not present

## 2014-11-05 DIAGNOSIS — I129 Hypertensive chronic kidney disease with stage 1 through stage 4 chronic kidney disease, or unspecified chronic kidney disease: Secondary | ICD-10-CM | POA: Diagnosis not present

## 2014-11-05 DIAGNOSIS — N189 Chronic kidney disease, unspecified: Secondary | ICD-10-CM | POA: Diagnosis not present

## 2014-11-05 DIAGNOSIS — K219 Gastro-esophageal reflux disease without esophagitis: Secondary | ICD-10-CM | POA: Diagnosis not present

## 2014-11-10 DIAGNOSIS — N179 Acute kidney failure, unspecified: Secondary | ICD-10-CM | POA: Diagnosis not present

## 2014-11-10 DIAGNOSIS — N189 Chronic kidney disease, unspecified: Secondary | ICD-10-CM | POA: Diagnosis not present

## 2014-11-10 DIAGNOSIS — M25522 Pain in left elbow: Secondary | ICD-10-CM | POA: Diagnosis not present

## 2014-11-10 DIAGNOSIS — E780 Pure hypercholesterolemia: Secondary | ICD-10-CM | POA: Diagnosis not present

## 2014-11-10 DIAGNOSIS — M19022 Primary osteoarthritis, left elbow: Secondary | ICD-10-CM | POA: Diagnosis not present

## 2014-11-10 DIAGNOSIS — I10 Essential (primary) hypertension: Secondary | ICD-10-CM | POA: Diagnosis not present

## 2014-11-10 DIAGNOSIS — E871 Hypo-osmolality and hyponatremia: Secondary | ICD-10-CM | POA: Diagnosis not present

## 2014-11-11 DIAGNOSIS — I129 Hypertensive chronic kidney disease with stage 1 through stage 4 chronic kidney disease, or unspecified chronic kidney disease: Secondary | ICD-10-CM | POA: Diagnosis not present

## 2014-11-11 DIAGNOSIS — N189 Chronic kidney disease, unspecified: Secondary | ICD-10-CM | POA: Diagnosis not present

## 2014-11-11 DIAGNOSIS — I509 Heart failure, unspecified: Secondary | ICD-10-CM | POA: Diagnosis not present

## 2014-11-11 DIAGNOSIS — I214 Non-ST elevation (NSTEMI) myocardial infarction: Secondary | ICD-10-CM | POA: Diagnosis not present

## 2014-11-11 DIAGNOSIS — D649 Anemia, unspecified: Secondary | ICD-10-CM | POA: Diagnosis not present

## 2014-11-11 DIAGNOSIS — K219 Gastro-esophageal reflux disease without esophagitis: Secondary | ICD-10-CM | POA: Diagnosis not present

## 2014-11-18 DIAGNOSIS — I129 Hypertensive chronic kidney disease with stage 1 through stage 4 chronic kidney disease, or unspecified chronic kidney disease: Secondary | ICD-10-CM | POA: Diagnosis not present

## 2014-11-18 DIAGNOSIS — K219 Gastro-esophageal reflux disease without esophagitis: Secondary | ICD-10-CM | POA: Diagnosis not present

## 2014-11-18 DIAGNOSIS — I214 Non-ST elevation (NSTEMI) myocardial infarction: Secondary | ICD-10-CM | POA: Diagnosis not present

## 2014-11-18 DIAGNOSIS — N189 Chronic kidney disease, unspecified: Secondary | ICD-10-CM | POA: Diagnosis not present

## 2014-11-18 DIAGNOSIS — I509 Heart failure, unspecified: Secondary | ICD-10-CM | POA: Diagnosis not present

## 2014-11-18 DIAGNOSIS — D649 Anemia, unspecified: Secondary | ICD-10-CM | POA: Diagnosis not present

## 2014-11-19 DIAGNOSIS — I509 Heart failure, unspecified: Secondary | ICD-10-CM | POA: Diagnosis not present

## 2014-11-19 DIAGNOSIS — I129 Hypertensive chronic kidney disease with stage 1 through stage 4 chronic kidney disease, or unspecified chronic kidney disease: Secondary | ICD-10-CM | POA: Diagnosis not present

## 2014-11-19 DIAGNOSIS — I214 Non-ST elevation (NSTEMI) myocardial infarction: Secondary | ICD-10-CM | POA: Diagnosis not present

## 2014-11-19 DIAGNOSIS — K219 Gastro-esophageal reflux disease without esophagitis: Secondary | ICD-10-CM | POA: Diagnosis not present

## 2014-11-19 DIAGNOSIS — D649 Anemia, unspecified: Secondary | ICD-10-CM | POA: Diagnosis not present

## 2014-11-19 DIAGNOSIS — N189 Chronic kidney disease, unspecified: Secondary | ICD-10-CM | POA: Diagnosis not present

## 2014-11-25 DIAGNOSIS — K219 Gastro-esophageal reflux disease without esophagitis: Secondary | ICD-10-CM | POA: Diagnosis not present

## 2014-11-25 DIAGNOSIS — N189 Chronic kidney disease, unspecified: Secondary | ICD-10-CM | POA: Diagnosis not present

## 2014-11-25 DIAGNOSIS — D649 Anemia, unspecified: Secondary | ICD-10-CM | POA: Diagnosis not present

## 2014-11-25 DIAGNOSIS — I214 Non-ST elevation (NSTEMI) myocardial infarction: Secondary | ICD-10-CM | POA: Diagnosis not present

## 2014-11-25 DIAGNOSIS — I129 Hypertensive chronic kidney disease with stage 1 through stage 4 chronic kidney disease, or unspecified chronic kidney disease: Secondary | ICD-10-CM | POA: Diagnosis not present

## 2014-11-25 DIAGNOSIS — I509 Heart failure, unspecified: Secondary | ICD-10-CM | POA: Diagnosis not present

## 2014-11-26 DIAGNOSIS — D649 Anemia, unspecified: Secondary | ICD-10-CM | POA: Diagnosis not present

## 2014-11-26 DIAGNOSIS — I129 Hypertensive chronic kidney disease with stage 1 through stage 4 chronic kidney disease, or unspecified chronic kidney disease: Secondary | ICD-10-CM | POA: Diagnosis not present

## 2014-11-26 DIAGNOSIS — N189 Chronic kidney disease, unspecified: Secondary | ICD-10-CM | POA: Diagnosis not present

## 2014-11-26 DIAGNOSIS — I214 Non-ST elevation (NSTEMI) myocardial infarction: Secondary | ICD-10-CM | POA: Diagnosis not present

## 2014-11-26 DIAGNOSIS — I509 Heart failure, unspecified: Secondary | ICD-10-CM | POA: Diagnosis not present

## 2014-11-26 DIAGNOSIS — K219 Gastro-esophageal reflux disease without esophagitis: Secondary | ICD-10-CM | POA: Diagnosis not present

## 2014-12-01 DIAGNOSIS — E871 Hypo-osmolality and hyponatremia: Secondary | ICD-10-CM | POA: Diagnosis not present

## 2014-12-01 DIAGNOSIS — D631 Anemia in chronic kidney disease: Secondary | ICD-10-CM | POA: Diagnosis not present

## 2014-12-01 DIAGNOSIS — Z23 Encounter for immunization: Secondary | ICD-10-CM | POA: Diagnosis not present

## 2014-12-01 DIAGNOSIS — N2581 Secondary hyperparathyroidism of renal origin: Secondary | ICD-10-CM | POA: Diagnosis not present

## 2014-12-01 DIAGNOSIS — N183 Chronic kidney disease, stage 3 (moderate): Secondary | ICD-10-CM | POA: Diagnosis not present

## 2014-12-01 DIAGNOSIS — I129 Hypertensive chronic kidney disease with stage 1 through stage 4 chronic kidney disease, or unspecified chronic kidney disease: Secondary | ICD-10-CM | POA: Diagnosis not present

## 2014-12-02 DIAGNOSIS — I214 Non-ST elevation (NSTEMI) myocardial infarction: Secondary | ICD-10-CM | POA: Diagnosis not present

## 2014-12-02 DIAGNOSIS — D649 Anemia, unspecified: Secondary | ICD-10-CM | POA: Diagnosis not present

## 2014-12-02 DIAGNOSIS — I129 Hypertensive chronic kidney disease with stage 1 through stage 4 chronic kidney disease, or unspecified chronic kidney disease: Secondary | ICD-10-CM | POA: Diagnosis not present

## 2014-12-02 DIAGNOSIS — N189 Chronic kidney disease, unspecified: Secondary | ICD-10-CM | POA: Diagnosis not present

## 2014-12-02 DIAGNOSIS — I509 Heart failure, unspecified: Secondary | ICD-10-CM | POA: Diagnosis not present

## 2014-12-02 DIAGNOSIS — K219 Gastro-esophageal reflux disease without esophagitis: Secondary | ICD-10-CM | POA: Diagnosis not present

## 2014-12-09 DIAGNOSIS — I129 Hypertensive chronic kidney disease with stage 1 through stage 4 chronic kidney disease, or unspecified chronic kidney disease: Secondary | ICD-10-CM | POA: Diagnosis not present

## 2014-12-09 DIAGNOSIS — I509 Heart failure, unspecified: Secondary | ICD-10-CM | POA: Diagnosis not present

## 2014-12-09 DIAGNOSIS — I214 Non-ST elevation (NSTEMI) myocardial infarction: Secondary | ICD-10-CM | POA: Diagnosis not present

## 2014-12-09 DIAGNOSIS — D649 Anemia, unspecified: Secondary | ICD-10-CM | POA: Diagnosis not present

## 2014-12-09 DIAGNOSIS — K219 Gastro-esophageal reflux disease without esophagitis: Secondary | ICD-10-CM | POA: Diagnosis not present

## 2014-12-09 DIAGNOSIS — N189 Chronic kidney disease, unspecified: Secondary | ICD-10-CM | POA: Diagnosis not present

## 2014-12-15 DIAGNOSIS — D649 Anemia, unspecified: Secondary | ICD-10-CM | POA: Diagnosis not present

## 2014-12-15 DIAGNOSIS — I129 Hypertensive chronic kidney disease with stage 1 through stage 4 chronic kidney disease, or unspecified chronic kidney disease: Secondary | ICD-10-CM | POA: Diagnosis not present

## 2014-12-15 DIAGNOSIS — I214 Non-ST elevation (NSTEMI) myocardial infarction: Secondary | ICD-10-CM | POA: Diagnosis not present

## 2014-12-15 DIAGNOSIS — N189 Chronic kidney disease, unspecified: Secondary | ICD-10-CM | POA: Diagnosis not present

## 2014-12-15 DIAGNOSIS — K219 Gastro-esophageal reflux disease without esophagitis: Secondary | ICD-10-CM | POA: Diagnosis not present

## 2014-12-15 DIAGNOSIS — I509 Heart failure, unspecified: Secondary | ICD-10-CM | POA: Diagnosis not present

## 2014-12-24 DIAGNOSIS — K219 Gastro-esophageal reflux disease without esophagitis: Secondary | ICD-10-CM | POA: Diagnosis not present

## 2014-12-24 DIAGNOSIS — N189 Chronic kidney disease, unspecified: Secondary | ICD-10-CM | POA: Diagnosis not present

## 2014-12-24 DIAGNOSIS — I509 Heart failure, unspecified: Secondary | ICD-10-CM | POA: Diagnosis not present

## 2014-12-24 DIAGNOSIS — I214 Non-ST elevation (NSTEMI) myocardial infarction: Secondary | ICD-10-CM | POA: Diagnosis not present

## 2014-12-24 DIAGNOSIS — I129 Hypertensive chronic kidney disease with stage 1 through stage 4 chronic kidney disease, or unspecified chronic kidney disease: Secondary | ICD-10-CM | POA: Diagnosis not present

## 2014-12-24 DIAGNOSIS — D649 Anemia, unspecified: Secondary | ICD-10-CM | POA: Diagnosis not present

## 2014-12-29 DIAGNOSIS — I129 Hypertensive chronic kidney disease with stage 1 through stage 4 chronic kidney disease, or unspecified chronic kidney disease: Secondary | ICD-10-CM | POA: Diagnosis not present

## 2014-12-29 DIAGNOSIS — I509 Heart failure, unspecified: Secondary | ICD-10-CM | POA: Diagnosis not present

## 2014-12-29 DIAGNOSIS — I214 Non-ST elevation (NSTEMI) myocardial infarction: Secondary | ICD-10-CM | POA: Diagnosis not present

## 2014-12-29 DIAGNOSIS — D649 Anemia, unspecified: Secondary | ICD-10-CM | POA: Diagnosis not present

## 2014-12-29 DIAGNOSIS — K219 Gastro-esophageal reflux disease without esophagitis: Secondary | ICD-10-CM | POA: Diagnosis not present

## 2014-12-29 DIAGNOSIS — N189 Chronic kidney disease, unspecified: Secondary | ICD-10-CM | POA: Diagnosis not present

## 2014-12-30 DIAGNOSIS — D649 Anemia, unspecified: Secondary | ICD-10-CM | POA: Diagnosis not present

## 2014-12-30 DIAGNOSIS — I129 Hypertensive chronic kidney disease with stage 1 through stage 4 chronic kidney disease, or unspecified chronic kidney disease: Secondary | ICD-10-CM | POA: Diagnosis not present

## 2014-12-30 DIAGNOSIS — N189 Chronic kidney disease, unspecified: Secondary | ICD-10-CM | POA: Diagnosis not present

## 2014-12-30 DIAGNOSIS — K219 Gastro-esophageal reflux disease without esophagitis: Secondary | ICD-10-CM | POA: Diagnosis not present

## 2014-12-30 DIAGNOSIS — M150 Primary generalized (osteo)arthritis: Secondary | ICD-10-CM | POA: Diagnosis not present

## 2014-12-30 DIAGNOSIS — I509 Heart failure, unspecified: Secondary | ICD-10-CM | POA: Diagnosis not present

## 2015-01-01 DIAGNOSIS — I129 Hypertensive chronic kidney disease with stage 1 through stage 4 chronic kidney disease, or unspecified chronic kidney disease: Secondary | ICD-10-CM | POA: Diagnosis not present

## 2015-01-01 DIAGNOSIS — K219 Gastro-esophageal reflux disease without esophagitis: Secondary | ICD-10-CM | POA: Diagnosis not present

## 2015-01-01 DIAGNOSIS — M150 Primary generalized (osteo)arthritis: Secondary | ICD-10-CM | POA: Diagnosis not present

## 2015-01-01 DIAGNOSIS — I509 Heart failure, unspecified: Secondary | ICD-10-CM | POA: Diagnosis not present

## 2015-01-01 DIAGNOSIS — D649 Anemia, unspecified: Secondary | ICD-10-CM | POA: Diagnosis not present

## 2015-01-01 DIAGNOSIS — N189 Chronic kidney disease, unspecified: Secondary | ICD-10-CM | POA: Diagnosis not present

## 2015-01-02 DIAGNOSIS — L03031 Cellulitis of right toe: Secondary | ICD-10-CM | POA: Diagnosis not present

## 2015-01-02 DIAGNOSIS — M79674 Pain in right toe(s): Secondary | ICD-10-CM | POA: Diagnosis not present

## 2015-01-02 DIAGNOSIS — B351 Tinea unguium: Secondary | ICD-10-CM | POA: Diagnosis not present

## 2015-01-04 DIAGNOSIS — M150 Primary generalized (osteo)arthritis: Secondary | ICD-10-CM | POA: Diagnosis not present

## 2015-01-04 DIAGNOSIS — D649 Anemia, unspecified: Secondary | ICD-10-CM | POA: Diagnosis not present

## 2015-01-04 DIAGNOSIS — I509 Heart failure, unspecified: Secondary | ICD-10-CM | POA: Diagnosis not present

## 2015-01-04 DIAGNOSIS — K219 Gastro-esophageal reflux disease without esophagitis: Secondary | ICD-10-CM | POA: Diagnosis not present

## 2015-01-04 DIAGNOSIS — I129 Hypertensive chronic kidney disease with stage 1 through stage 4 chronic kidney disease, or unspecified chronic kidney disease: Secondary | ICD-10-CM | POA: Diagnosis not present

## 2015-01-04 DIAGNOSIS — N189 Chronic kidney disease, unspecified: Secondary | ICD-10-CM | POA: Diagnosis not present

## 2015-01-06 DIAGNOSIS — K219 Gastro-esophageal reflux disease without esophagitis: Secondary | ICD-10-CM | POA: Diagnosis not present

## 2015-01-06 DIAGNOSIS — I509 Heart failure, unspecified: Secondary | ICD-10-CM | POA: Diagnosis not present

## 2015-01-06 DIAGNOSIS — I129 Hypertensive chronic kidney disease with stage 1 through stage 4 chronic kidney disease, or unspecified chronic kidney disease: Secondary | ICD-10-CM | POA: Diagnosis not present

## 2015-01-06 DIAGNOSIS — N189 Chronic kidney disease, unspecified: Secondary | ICD-10-CM | POA: Diagnosis not present

## 2015-01-06 DIAGNOSIS — D649 Anemia, unspecified: Secondary | ICD-10-CM | POA: Diagnosis not present

## 2015-01-06 DIAGNOSIS — M150 Primary generalized (osteo)arthritis: Secondary | ICD-10-CM | POA: Diagnosis not present

## 2015-01-13 DIAGNOSIS — M150 Primary generalized (osteo)arthritis: Secondary | ICD-10-CM | POA: Diagnosis not present

## 2015-01-13 DIAGNOSIS — D649 Anemia, unspecified: Secondary | ICD-10-CM | POA: Diagnosis not present

## 2015-01-13 DIAGNOSIS — I509 Heart failure, unspecified: Secondary | ICD-10-CM | POA: Diagnosis not present

## 2015-01-13 DIAGNOSIS — I129 Hypertensive chronic kidney disease with stage 1 through stage 4 chronic kidney disease, or unspecified chronic kidney disease: Secondary | ICD-10-CM | POA: Diagnosis not present

## 2015-01-13 DIAGNOSIS — K219 Gastro-esophageal reflux disease without esophagitis: Secondary | ICD-10-CM | POA: Diagnosis not present

## 2015-01-13 DIAGNOSIS — N189 Chronic kidney disease, unspecified: Secondary | ICD-10-CM | POA: Diagnosis not present

## 2015-01-20 DIAGNOSIS — I509 Heart failure, unspecified: Secondary | ICD-10-CM | POA: Diagnosis not present

## 2015-01-20 DIAGNOSIS — K219 Gastro-esophageal reflux disease without esophagitis: Secondary | ICD-10-CM | POA: Diagnosis not present

## 2015-01-20 DIAGNOSIS — I129 Hypertensive chronic kidney disease with stage 1 through stage 4 chronic kidney disease, or unspecified chronic kidney disease: Secondary | ICD-10-CM | POA: Diagnosis not present

## 2015-01-20 DIAGNOSIS — N189 Chronic kidney disease, unspecified: Secondary | ICD-10-CM | POA: Diagnosis not present

## 2015-01-20 DIAGNOSIS — M150 Primary generalized (osteo)arthritis: Secondary | ICD-10-CM | POA: Diagnosis not present

## 2015-01-20 DIAGNOSIS — D649 Anemia, unspecified: Secondary | ICD-10-CM | POA: Diagnosis not present

## 2015-01-27 DIAGNOSIS — K219 Gastro-esophageal reflux disease without esophagitis: Secondary | ICD-10-CM | POA: Diagnosis not present

## 2015-01-27 DIAGNOSIS — I509 Heart failure, unspecified: Secondary | ICD-10-CM | POA: Diagnosis not present

## 2015-01-27 DIAGNOSIS — N189 Chronic kidney disease, unspecified: Secondary | ICD-10-CM | POA: Diagnosis not present

## 2015-01-27 DIAGNOSIS — D649 Anemia, unspecified: Secondary | ICD-10-CM | POA: Diagnosis not present

## 2015-01-27 DIAGNOSIS — I129 Hypertensive chronic kidney disease with stage 1 through stage 4 chronic kidney disease, or unspecified chronic kidney disease: Secondary | ICD-10-CM | POA: Diagnosis not present

## 2015-01-27 DIAGNOSIS — M150 Primary generalized (osteo)arthritis: Secondary | ICD-10-CM | POA: Diagnosis not present

## 2015-02-03 DIAGNOSIS — I129 Hypertensive chronic kidney disease with stage 1 through stage 4 chronic kidney disease, or unspecified chronic kidney disease: Secondary | ICD-10-CM | POA: Diagnosis not present

## 2015-02-03 DIAGNOSIS — I509 Heart failure, unspecified: Secondary | ICD-10-CM | POA: Diagnosis not present

## 2015-02-03 DIAGNOSIS — M150 Primary generalized (osteo)arthritis: Secondary | ICD-10-CM | POA: Diagnosis not present

## 2015-02-03 DIAGNOSIS — K219 Gastro-esophageal reflux disease without esophagitis: Secondary | ICD-10-CM | POA: Diagnosis not present

## 2015-02-03 DIAGNOSIS — N189 Chronic kidney disease, unspecified: Secondary | ICD-10-CM | POA: Diagnosis not present

## 2015-02-03 DIAGNOSIS — D649 Anemia, unspecified: Secondary | ICD-10-CM | POA: Diagnosis not present

## 2015-02-10 DIAGNOSIS — K219 Gastro-esophageal reflux disease without esophagitis: Secondary | ICD-10-CM | POA: Diagnosis not present

## 2015-02-10 DIAGNOSIS — I129 Hypertensive chronic kidney disease with stage 1 through stage 4 chronic kidney disease, or unspecified chronic kidney disease: Secondary | ICD-10-CM | POA: Diagnosis not present

## 2015-02-10 DIAGNOSIS — I509 Heart failure, unspecified: Secondary | ICD-10-CM | POA: Diagnosis not present

## 2015-02-10 DIAGNOSIS — M150 Primary generalized (osteo)arthritis: Secondary | ICD-10-CM | POA: Diagnosis not present

## 2015-02-10 DIAGNOSIS — N189 Chronic kidney disease, unspecified: Secondary | ICD-10-CM | POA: Diagnosis not present

## 2015-02-10 DIAGNOSIS — R001 Bradycardia, unspecified: Secondary | ICD-10-CM | POA: Diagnosis not present

## 2015-02-10 DIAGNOSIS — D649 Anemia, unspecified: Secondary | ICD-10-CM | POA: Diagnosis not present

## 2015-02-11 DIAGNOSIS — I129 Hypertensive chronic kidney disease with stage 1 through stage 4 chronic kidney disease, or unspecified chronic kidney disease: Secondary | ICD-10-CM | POA: Diagnosis not present

## 2015-02-11 DIAGNOSIS — N189 Chronic kidney disease, unspecified: Secondary | ICD-10-CM | POA: Diagnosis not present

## 2015-02-11 DIAGNOSIS — M150 Primary generalized (osteo)arthritis: Secondary | ICD-10-CM | POA: Diagnosis not present

## 2015-02-11 DIAGNOSIS — K219 Gastro-esophageal reflux disease without esophagitis: Secondary | ICD-10-CM | POA: Diagnosis not present

## 2015-02-11 DIAGNOSIS — I509 Heart failure, unspecified: Secondary | ICD-10-CM | POA: Diagnosis not present

## 2015-02-11 DIAGNOSIS — D649 Anemia, unspecified: Secondary | ICD-10-CM | POA: Diagnosis not present

## 2015-02-16 DIAGNOSIS — M150 Primary generalized (osteo)arthritis: Secondary | ICD-10-CM | POA: Diagnosis not present

## 2015-02-16 DIAGNOSIS — D649 Anemia, unspecified: Secondary | ICD-10-CM | POA: Diagnosis not present

## 2015-02-16 DIAGNOSIS — K219 Gastro-esophageal reflux disease without esophagitis: Secondary | ICD-10-CM | POA: Diagnosis not present

## 2015-02-16 DIAGNOSIS — I129 Hypertensive chronic kidney disease with stage 1 through stage 4 chronic kidney disease, or unspecified chronic kidney disease: Secondary | ICD-10-CM | POA: Diagnosis not present

## 2015-02-16 DIAGNOSIS — I509 Heart failure, unspecified: Secondary | ICD-10-CM | POA: Diagnosis not present

## 2015-02-16 DIAGNOSIS — N189 Chronic kidney disease, unspecified: Secondary | ICD-10-CM | POA: Diagnosis not present

## 2015-02-24 DIAGNOSIS — I509 Heart failure, unspecified: Secondary | ICD-10-CM | POA: Diagnosis not present

## 2015-02-24 DIAGNOSIS — I129 Hypertensive chronic kidney disease with stage 1 through stage 4 chronic kidney disease, or unspecified chronic kidney disease: Secondary | ICD-10-CM | POA: Diagnosis not present

## 2015-02-24 DIAGNOSIS — N189 Chronic kidney disease, unspecified: Secondary | ICD-10-CM | POA: Diagnosis not present

## 2015-02-24 DIAGNOSIS — K219 Gastro-esophageal reflux disease without esophagitis: Secondary | ICD-10-CM | POA: Diagnosis not present

## 2015-02-24 DIAGNOSIS — M150 Primary generalized (osteo)arthritis: Secondary | ICD-10-CM | POA: Diagnosis not present

## 2015-02-24 DIAGNOSIS — D649 Anemia, unspecified: Secondary | ICD-10-CM | POA: Diagnosis not present

## 2015-02-25 DIAGNOSIS — N179 Acute kidney failure, unspecified: Secondary | ICD-10-CM | POA: Diagnosis not present

## 2015-02-25 DIAGNOSIS — E871 Hypo-osmolality and hyponatremia: Secondary | ICD-10-CM | POA: Diagnosis not present

## 2015-02-25 DIAGNOSIS — E780 Pure hypercholesterolemia: Secondary | ICD-10-CM | POA: Diagnosis not present

## 2015-02-25 DIAGNOSIS — I10 Essential (primary) hypertension: Secondary | ICD-10-CM | POA: Diagnosis not present

## 2015-02-28 DIAGNOSIS — I509 Heart failure, unspecified: Secondary | ICD-10-CM | POA: Diagnosis not present

## 2015-02-28 DIAGNOSIS — K219 Gastro-esophageal reflux disease without esophagitis: Secondary | ICD-10-CM | POA: Diagnosis not present

## 2015-02-28 DIAGNOSIS — D649 Anemia, unspecified: Secondary | ICD-10-CM | POA: Diagnosis not present

## 2015-02-28 DIAGNOSIS — M150 Primary generalized (osteo)arthritis: Secondary | ICD-10-CM | POA: Diagnosis not present

## 2015-02-28 DIAGNOSIS — N189 Chronic kidney disease, unspecified: Secondary | ICD-10-CM | POA: Diagnosis not present

## 2015-02-28 DIAGNOSIS — R001 Bradycardia, unspecified: Secondary | ICD-10-CM | POA: Diagnosis not present

## 2015-02-28 DIAGNOSIS — I129 Hypertensive chronic kidney disease with stage 1 through stage 4 chronic kidney disease, or unspecified chronic kidney disease: Secondary | ICD-10-CM | POA: Diagnosis not present

## 2015-03-02 DIAGNOSIS — D631 Anemia in chronic kidney disease: Secondary | ICD-10-CM | POA: Diagnosis not present

## 2015-03-02 DIAGNOSIS — E871 Hypo-osmolality and hyponatremia: Secondary | ICD-10-CM | POA: Diagnosis not present

## 2015-03-02 DIAGNOSIS — I129 Hypertensive chronic kidney disease with stage 1 through stage 4 chronic kidney disease, or unspecified chronic kidney disease: Secondary | ICD-10-CM | POA: Diagnosis not present

## 2015-03-02 DIAGNOSIS — N2581 Secondary hyperparathyroidism of renal origin: Secondary | ICD-10-CM | POA: Diagnosis not present

## 2015-03-02 DIAGNOSIS — N184 Chronic kidney disease, stage 4 (severe): Secondary | ICD-10-CM | POA: Diagnosis not present

## 2015-03-03 DIAGNOSIS — R001 Bradycardia, unspecified: Secondary | ICD-10-CM | POA: Diagnosis not present

## 2015-03-03 DIAGNOSIS — I509 Heart failure, unspecified: Secondary | ICD-10-CM | POA: Diagnosis not present

## 2015-03-03 DIAGNOSIS — N189 Chronic kidney disease, unspecified: Secondary | ICD-10-CM | POA: Diagnosis not present

## 2015-03-03 DIAGNOSIS — I129 Hypertensive chronic kidney disease with stage 1 through stage 4 chronic kidney disease, or unspecified chronic kidney disease: Secondary | ICD-10-CM | POA: Diagnosis not present

## 2015-03-03 DIAGNOSIS — M150 Primary generalized (osteo)arthritis: Secondary | ICD-10-CM | POA: Diagnosis not present

## 2015-03-03 DIAGNOSIS — D649 Anemia, unspecified: Secondary | ICD-10-CM | POA: Diagnosis not present

## 2015-03-04 DIAGNOSIS — E871 Hypo-osmolality and hyponatremia: Secondary | ICD-10-CM | POA: Diagnosis not present

## 2015-03-04 DIAGNOSIS — I10 Essential (primary) hypertension: Secondary | ICD-10-CM | POA: Diagnosis not present

## 2015-03-04 DIAGNOSIS — E780 Pure hypercholesterolemia: Secondary | ICD-10-CM | POA: Diagnosis not present

## 2015-03-04 DIAGNOSIS — N183 Chronic kidney disease, stage 3 (moderate): Secondary | ICD-10-CM | POA: Diagnosis not present

## 2015-03-12 DIAGNOSIS — I509 Heart failure, unspecified: Secondary | ICD-10-CM | POA: Diagnosis not present

## 2015-03-12 DIAGNOSIS — R001 Bradycardia, unspecified: Secondary | ICD-10-CM | POA: Diagnosis not present

## 2015-03-12 DIAGNOSIS — N189 Chronic kidney disease, unspecified: Secondary | ICD-10-CM | POA: Diagnosis not present

## 2015-03-12 DIAGNOSIS — M150 Primary generalized (osteo)arthritis: Secondary | ICD-10-CM | POA: Diagnosis not present

## 2015-03-12 DIAGNOSIS — I129 Hypertensive chronic kidney disease with stage 1 through stage 4 chronic kidney disease, or unspecified chronic kidney disease: Secondary | ICD-10-CM | POA: Diagnosis not present

## 2015-03-12 DIAGNOSIS — D649 Anemia, unspecified: Secondary | ICD-10-CM | POA: Diagnosis not present

## 2015-03-13 NOTE — Consult Note (Signed)
Chief Complaint:  Subjective/Chief Complaint Pt states to be feeling better. Her head does not hurt as much now. She denies syncope no n/v/d   VITAL SIGNS/ANCILLARY NOTES: **Vital Signs.:   21-Mar-14 08:04  Vital Signs Type Routine  Temperature Temperature (F) 97.9  Celsius 36.6  Temperature Source oral  Pulse Pulse 54  Respirations Respirations 20  Systolic BP Systolic BP 250  Diastolic BP (mmHg) Diastolic BP (mmHg) 87  Mean BP 115  Pulse Ox % Pulse Ox % 94  Pulse Ox Activity Level  At rest  Oxygen Delivery Room Air/ 21 %  *Intake and Output.:   21-Mar-14 08:01  Grand Totals Intake:   Output:  300    Net:  -300 24 Hr.:  -525  Urine ml     Out:  300  Urinary Method  Void; BSC   Brief Assessment:  Cardiac Regular  murmur present  -- LE edema  -- JVD   Respiratory normal resp effort  clear BS   Gastrointestinal Normal   Gastrointestinal details normal Soft  Nontender  Bowel sounds normal   Additional Physical Exam Mental status clear   Lab Results: Routine Chem:  21-Mar-14 04:36   Glucose, Serum 79  BUN  27  Creatinine (comp) 1.05  Sodium, Serum  133  Potassium, Serum 3.8  Chloride, Serum 101  CO2, Serum 25  Calcium (Total), Serum  8.3  Anion Gap 7  Osmolality (calc) 270  eGFR (African American)  56  eGFR (Non-African American)  48 (eGFR values <2m/min/1.73 m2 may be an indication of chronic kidney disease (CKD). Calculated eGFR is useful in patients with stable renal function. The eGFR calculation will not be reliable in acutely ill patients when serum creatinine is changing rapidly. It is not useful in  patients on dialysis. The eGFR calculation may not be applicable to patients at the low and high extremes of body sizes, pregnant women, and vegetarians.)  Magnesium, Serum 1.9 (1.8-2.4 THERAPEUTIC RANGE: 4-7 mg/dL TOXIC: > 10 mg/dL  -----------------------)   Radiology Results: XRay:    18-Mar-14 17:33, Knee Right AP and Lateral  Knee Right AP  and Lateral   REASON FOR EXAM:    s/p fall; right knee pain  COMMENTS:       PROCEDURE: DXR - DXR KNEE RIGHT AP AND LATERAL  - Feb 05 2013  5:33PM     RESULT: Comparison: None.    Findings:  Hardware seen from total knee arthroplasty. No acute fracture seen.    IMPRESSION:   No acute fracture.      Dictation Site: 8    Verified By: RGregor Hams M.D., MD  Cardiology:    18-Mar-14 18:19, ECG  Ventricular Rate 133  Atrial Rate 133  P-R Interval 168  QRS Duration 82  QT 302  QTc 449  R Axis -331 T Axis 74  ECG interpretation   Sinus tachycardia  Left axis deviation  Anteroseptal infarct (cited on or before 08-Mar-2012)  Abnormal ECG  When compared with ECG of 08-Mar-2012 14:57,  Vent. rate has increased BY  74 BPM  Questionable change in initial forces of Septal leads  Inverted T waves have replaced nonspecific T wave abnormality in Lateral leads  ----------unconfirmed----------  Confirmed by OVERREAD, NOT ([id number]), editor PEARSON, BARBARA ([id number]) on 02/06/2013 10:01:09 AM  ECG     18-Mar-14 20:04, ECG  Ventricular Rate 64  Atrial Rate 64  P-R Interval 202  QRS Duration 86  QT 434  QTc 447  P Axis 90  R Axis -19  T Axis 51  ECG interpretation   Normal sinus rhythm  Septal infarct (cited on or before 08-Mar-2012)  Abnormal ECG  When compared with ECG of 05-Feb-2013 18:19,  Vent. rate has decreased BY  69 BPM  Questionable change in initial forces of Septal leads  T wave inversion no longer evident in Lateral leads  ----------unconfirmed----------  Confirmed by OVERREAD, NOT ([id number]), editor PEARSON, BARBARA ([id number]) on 02/06/2013 10:01:22 AM  ECG   CT:    18-Mar-14 17:17, CT Head Without Contrast  CT Head Without Contrast   REASON FOR EXAM:    s/ fall; head laceration  COMMENTS:       PROCEDURE: CT  - CT HEAD WITHOUT CONTRAST  - Feb 05 2013  5:17PM     RESULT: Comparison:  08/21/2011    Technique: Multiple axial images from the foramen magnum to the vertex    were obtained without IV contrast.    Findings:    There is no evidence for mass effect, midline shift, or extra-axial fluid   collections. There is no evidence for space-occupying lesion,   intracranial hemorrhage, or cortical-based area of infarction.   Periventricular and subcortical hypoattenuation is consistent with     chronic small vessel ischemic disease.     There is soft tissue swelling and laceration along the left frontal scalp.    The osseous structures are unremarkable.    IMPRESSION:    1. No acute intracranial process.  2. Chronic small vessel ischemic disease.      Dictation Site: 8        Verified By: Gregor Hams, M.D., MD   Assessment/Plan:  Invasive Device Daily Assessment of Necessity:  Does the patient currently have any of the following indwelling devices? none   Assessment/Plan:  Assessment IMP Bradycardia Malignant HTN Fall Minor Head trauma MUrmur DJD   Plan PLAN Hold Labetalol because of brady Increase Hydralazine 100 Increase imdur 60 We may resume  labatalol at 100 bid if her hr improves Remove suture 1 week later I rec OT/PT for improving fall risk Have pt f/u as out 1-2 weeks NO clear indication PPM   Electronic Signatures: Lujean Amel D (MD)  (Signed 21-Mar-14 20:01)  Authored: Chief Complaint, VITAL SIGNS/ANCILLARY NOTES, Brief Assessment, Lab Results, Radiology Results, Assessment/Plan   Last Updated: 21-Mar-14 20:01 by Lujean Amel D (MD)

## 2015-03-13 NOTE — Discharge Summary (Signed)
PATIENT NAME:  Laura [name], Laura [name] MR#:  [id number] DATE OF BIRTH:  01/10/1924  DATE OF ADMISSION:  02/05/2013 DATE OF DISCHARGE:  02/08/2013  PRIMARY CARE PHYSICIAN:  Dr. Harrington Challengerhies CONSULTATION:  Dr. Juliann Paresallwood  DISCHARGE DIAGNOSES: 1.  Hypertension malignancy. 2.  Supraventricular tachycardia.  3.  Bradycardia.  4.  Hyponatremia.  5.  Dehydration.  6.  Fall.   CONDITION:  Stable.  CODE STATUS:  FULL CODE.   HOME MEDICATIONS: 1.  Prilosec 20 mg p.o. daily. 2.  Mag-Ox 400 p.o. b.i.d. 3.  Lisinopril 40 mg p.o. daily. 4.  Imdur 30 mg p.o. daily. 5.  Aspirin 81 mg p.o. daily.  6.  Calcium 600 plus D 600 mg -- 200 units p.o. tablets b.i.d.  7.  Lutein 6 mg p.o. b.i.d.  8.  Multivitamin 1 cap daily.  9.  Acetaminophen 500 mg p.o. p.r.[name]. for pain.  10.  Colace 100 mg p.o. daily.  11.  Ferrous sulfate 325 mg p.o. daily.  12.  Hydralazine 100 mg p.o. q. 8 hours.   ACTIVITY: As tolerated.   DIET:  Low-sodium diet.  FOLLOW-UP CARE:  Follow up with PCP within 1 to 2 weeks. Follow up with Dr. Juliann Paresallwood within 1 week.   REASON FOR ADMISSION:  Fall and high blood pressure.   HOSPITAL COURSE: The patient is an [age] year old Caucasian female with a history of hypertension, SVT, osteoarthritis presented in the ED with fall and high blood pressure.  After the patient had a fall by accident, then was noted to have a high blood pressure to 222/88. Her heart rate was 130 in the ED. The patient was treated with labetalol IV push without real improvement, so patient was started with labetalol drip and admitted to CCU.  For detailed history and physical examination, please refer to the admission note dictated by me. The patient denies any symptoms except forehead pain due to laceration, which was sutured in ED.  A CAT scan of head without contrast does show no acute intracranial process.  After admission, the patient was on labetalol drip.  Blood pressure has been improving, so patient's labetalol was  discontinued and changed to p.o. home medication with lisinopril, labetalol and hydralazine.  However, the patient developed bradycardia at 40, 50 to 60. Dr. Juliann Paresallwood evaluated the patient and suggested discontinue labetalol, increase hydralazine to 100 mg p.o. q. 8 hours with Norvasc. However, THE PATIENT HAS AN ALLERGY FOR NORVASC.  Dr. Juliann Paresallwood suggested to follow up with him as outpatient. He may resume labetalol as outpatient.  For dehydration, the patient has been treated with IV fluid.  Hyponatremia. The patient has been treated with normal saline.  Hypomagnesia.  Treated with magnesium supplement.   The patient has no symptoms. She is clinically stable. Will be discharged to home today. I discussed the patient's discharge plan with the patient, Dr. Juliann Paresallwood and the case manager.   TIME SPENT:  About 43 minutes.    ____________________________ Shaune PollackQing Ceara Wrightson, MD qc:ce D: 02/08/2013 18:13:01 ET T: 02/09/2013 11:22:35 ET JOB#: [id number]  cc: Shaune PollackQing Herbie Lehrmann, MD, <Dictator> Shaune PollackQING Sherril Shipman MD ELECTRONICALLY SIGNED 02/09/2013 18:15

## 2015-03-13 NOTE — H&P (Signed)
PATIENT NAME:  Laura [name], Laura [name] MR#:  [id number] DATE OF BIRTH:  [birth year]  DATE OF ADMISSION:  02/05/2013  PRIMARY CARE PHYSICIAN: Neomia Dearavid [name]. Harrington Challengerhies, MD   REFERRING PHYSICIAN:   Su Leyobert L. Kinner, MD    CHIEF COMPLAINT: Fall and high blood pressure today.   HISTORY OF PRESENT ILLNESS: The patient is an 81 year old Caucasian female with a history of hypertension, SVT, osteoarthritis, presented to the ED with the above chief complaint. The patient is alert, awake, oriented, in no acute distress. The patient came from assisted living.  The patient fell by accident today, hit her forehead with laceration, got a suture in the ED. The patient complains of a headache, but a CAT scan of head was negative for intracranial hemorrhage or infarction. The patient was noted to have a high blood pressure at 222/88.  Heart rate was 130 in the ED.  She was treated with labetalol  IV push, but the blood pressure is still high, over 200 for the past 4 hours. The patient's current blood pressure to 217/92.  Since the patient has mild dehydration with a BUN 25, the patient was treated with normal saline in the ED. The patient complains of a headache in the forehead but denies any other symptoms. No chest pain, palpitation, orthopnea or nocturnal dyspnea. No leg edema.   PAST MEDICAL HISTORY: Hypertension, SVT, osteoarthritis, GERD, scoliosis.   PAST SURGICAL HISTORY: Bilateral knee replacement, breast biopsy, partial hysterectomy.   SOCIAL HISTORY: Living in assisted living facility.  No smoking or drinking or illicit drugs.   FAMILY HISTORY: Hypertension.   REVIEW OF SYSTEMS:   CONSTITUTIONAL: The patient denies any fever or chills. Has a headache but no dizziness or weakness.  EYES: No double vision or blurred vision.  ENT: No postnasal drip, slurred speech or dysphagia.  CARDIOVASCULAR: No chest pain, palpitation, orthopnea or nocturnal dyspnea. No leg edema.   PULMONARY: No cough, sputum, shortness of breath or  hemoptysis.  GASTROINTESTINAL: No abdominal pain, nausea, vomiting or diarrhea. No melena or bloody stool.  GENITOURINARY: No dysuria, hematuria or incontinence.  SKIN: No rash or jaundice.  HEMATOLOGY: No easy bruising, bleeding.  ENDOCRINOLOGY: No polyuria, polydipsia, heat or cold intolerance.  NEUROLOGICAL: No syncope, loss of consciousness or seizure.   ALLERGIES:  AMLODIPINE, CODEINE, PENICILLIN SUPER DRUG.   HOME MEDICATIONS:  Prilosec 20 mg p.o. daily, Mag-Ox 400, 1 tablet p.o. b.i.d., multivitamin 1 tablet p.o. b.i.d., Lutein 6 mg p.o. capsule 1 cap b.i.d., lisinopril 40 mg p.o. daily, labetalol 200 mg p.o. b.i.d., Imdur 30 mg extended-release 1 tablet once a day, hydralazine 25 mg p.o. t.i.d.,  ferrous sulfate 325 mg p.o. daily, Colace 100 mg p.o. daily, calcium 600 plus D 600 mg 1 tablet p.o. b.i.d., aspirin 81 mg p.o. daily, acetaminophen 500 mg p.o. p.r.[name].   PHYSICAL EXAMINATION: VITAL SIGNS: Temperature 98.2, blood pressure 217/92, pulse 52, O2 saturation 99% on room air.  GENERAL: The patient is alert, awake, oriented, in no acute distress.  HEENT: Forehead is in dressing. Pupils are round, equal, reactive to light and accommodation. Moist oral mucosa. Clear oropharynx. NECK: Supple. No JVD or carotid bruit. No lymphadenopathy. No thyromegaly.  CARDIOVASCULAR: S1, S2 regular rate and rhythm. No murmurs or gallops.  PULMONARY: Bilateral air entry. No wheezing or rales. No use of accessory muscles to breathe.  ABDOMEN: Soft. No distention or tenderness. No organomegaly. Bowel sounds present.  EXTREMITIES: No edema, clubbing, or cyanosis. No calf tenderness. Strong bilateral pedal pulses.  SKIN: No  rash or jaundice.  NEUROLOGIC: Alert and oriented x 3. No focal deficit. Power 5 out of 5. Sensation intact.   LABORATORY, DIAGNOSTIC AND RADIOLOGICAL DATA:  CBC normal. Glucose 91, BUN 25, creatinine 1.3, sodium 134, potassium 3.7, chloride 97, bicarb 29. INR 0.9, PTT 31.6, troponin  less than 0.02.   Right knee x-ray:  No acute fracture. CAT scan of the head without contrast:  No acute intracranial process. First EKG shows SVT at 133. Second EKG shows sinus bradycardia at 58 bpm.   IMPRESSION: 1. Hypertensive emergency.  2. Supraventricular tachycardia.  3. Dehydration.  4. Fall.  5. Hyponatremia.   PLAN OF TREATMENT:  The patient will be admitted to a step-down. We will continue telemonitor. Vital signs per CCU protocol. We will start labetalol drip. May change to p.o. hypertension medication if blood pressure is controlled under labetalol drip. We will continue lisinopril, labetalol, p.o. hydralazine, Imdur and continue aspirin. Fall precautions. GI and deep vein thrombosis prophylaxis. I discussed the patient's condition with the patient and the patient's daughter.   TIME SPENT: About 59 minutes.   ____________________________ Shaune Pollack, MD qc:cb D: 02/05/2013 23:04:59 ET T: 02/05/2013 23:16:03 ET JOB#: [id number]  cc: Shaune Pollack, MD, <Dictator> Shaune Pollack MD ELECTRONICALLY SIGNED 02/09/2013 18:05

## 2015-03-13 NOTE — Consult Note (Signed)
Brief Consult Note: Diagnosis: Bradycardia/SVT/SSS/HTN.   Patient was seen by consultant.   Consult note dictated.   Recommend further assessment or treatment.   Orders entered.   Discussed with Attending MD.   Comments: IMP Bradycardia Malig HTN SSS SVT Fall Angina , PLAN Hold Labetalol for now Increase Hydralazine/Imdur Consider Amilodapine No B-blockers PT/OT to reduce fall risk Treat laceration I do not rec invasive cardiac testing I do not believe she needs a PPM at this point.  Electronic Signatures: Dorothyann Pengallwood, Quinterious Walraven D (MD)  (Signed 21-Mar-14 07:18)  Authored: Brief Consult Note   Last Updated: 21-Mar-14 07:18 by Dorothyann Pengallwood, Jsoeph Podesta D (MD)

## 2015-03-13 NOTE — Consult Note (Signed)
PATIENT NAME:  Laura [name], Laura [name] MR#:  [id number] DATE OF BIRTH:  1924/09/15  DATE OF CONSULTATION:  02/07/2013  PRIMARY CARE PHYSICIAN: Neomia Dearavid [name]. Harrington Challengerhies, MD   REFERRING PHYSICIAN:  Shaune PollackQing Chen, MD with [facility] CONSULTING PHYSICIAN:  Dwayne D. Callwood, MD  INDICATION: Hypertension, SVT, possible syncope with borderline troponins and malignant hypertension.   HISTORY OF PRESENT ILLNESS: The patient is an 81 year old female known to me with hypertension, SVT, osteoarthritis, presented to the Emergency Room after having slipped and fell and hitting her head. She states that she lives in assisted living and fell by accident.  She did not pass out, did not get lightheaded. She presented at this time to the Emergency Room because of laceration to her forehead. A CAT scan was negative, but she was found to have elevated blood pressures 220/88, heart rate was 130. She was treated with labetalol and had persistent elevated blood pressure. She was subsequently admitted.  She got further doses of labetalol and eventually had better blood pressure control, but her heart rate started to drop as low as 40s to 50s with a sinus rhythm. The patient denied any weakness, dizziness, vertigo or syncope, even with the bradycardia; but Cardiology was recommended for further evaluation possibly because of sick sinus syndrome.   REVIEW OF SYSTEMS: Denies any blackout spells or syncope. She has had a fall, no weight loss, no weight gain. No hemoptysis or hematemesis. No bright red blood per rectum. No vision change or hearing change. Denies sputum production or cough. She still feels unsteady on her feet at times.   PAST MEDICAL HISTORY: Hypertension, SVT, osteoarthritis, GERD, scoliosis.   PAST SURGICAL HISTORY: Bilateral knee replacements, breast biopsy, partial hysterectomy.   SOCIAL HISTORY: Lives in assisted living. She is a widow. No smoking or alcohol consumption.   FAMILY HISTORY: Hypertension.   ALLERGIES:  AMLODIPINE, CODEINE AND PENICILLIN.    PHYSICAL EXAMINATION: VITAL SIGNS: Initially, blood pressure was 220 systolic.  Follow-up  blood pressure was 150 but heart rate of 40, respiratory rate of 12, afebrile.  HEENT: Normocephalic, atraumatic except for a laceration on the frontal portion which has been sutured.  NECK: Supple. No significant JVD, bruits or adenopathy.  LUNGS: Clear to auscultation and percussion. No significant wheeze, rhonchi, or rales.  HEART: Regular rate and rhythm. Positive S4. Systolic ejection murmur at the apex. PMI nondisplaced.  ABDOMEN: Benign.  EXTREMITIES: Within normal limits.  NEUROLOGIC: Intact.  SKIN: Normal.   HOME MEDICATIONS: Prilosec 20 a day, Mag-Ox 400 a day, multivitamin once a day, Lutein  6 mg twice a day, lisinopril 40 a day, labetalol 200 twice a day, Imdur 30 extended release once a day, hydralazine 25, 3 times a day, ferrous sulfate 325 a day, Colace 100 a day, calcium 600 Plus D twice a day, aspirin 81 mg a day, acetaminophen 500 p.r.[name].  LABORATORY AND RADIOLOGICAL DATA: CBC was normal. Glucose 91, BUN 25, creatinine 1.3, sodium 138, potassium 3.7, chloride 97, bicarbonate 29. PT/INR were normal. Troponin less than 0.02.   ASSESSMENT: 1. Hypertensive emergency.  2. Bradycardia.  3. Fall.  4. Dehydration.  5. History of supraventricular tachycardia.  6. Hyponatremia.  7. Possible sick sinus syndrome.   PLAN: 1. I agree with admit. I agree with placing in the unit for now. Following up neuro status changes. Continue blood pressure management with malignant hypertension. I will have to discontinue labetalol for now. It is causing secondary bradycardia. I would probably recommend increasing hydralazine, and doubling the dose,  and increasing Imdur during the interim. She is unable to take amlodipine, so we may have to add additional drug that does not cause bradycardia to help with her blood pressure. She has had significantly elevated systolic  blood pressures in the past.  2. Bradycardia:  Again, we will need to adjust medication dosages because of bradycardia and discontinue labetalol for now. We may have to restart it at a lower dose once the heart rate is improved. We will use hydralazine in the interim. I do not see any clinical indication for a permanent pacemaker or a temporary pacemaker at this point. I think the bradycardia is all medication-related. She may have some underlying sick sinus syndrome, but I think conservative therapy would be prudent at this point.  3. Supraventricular tachycardia: She has had SVT in the past. No clear evidence of it recently. She has done reasonably well on medication, and I will continue to do so. I do not see any indication for ablation.  4. Dehydration: This may be partly why she fell. We will try to hydrate gently. 5. Falls: She may need physical and occupational therapy to help her to help with her balance so she can reduce her fall risk.  6. Hyponatremia: We will try to correct her sodium as necessary. Again, I will continue to treat the patient conservatively for now.   I do not recommend cardiac catheterization at this point. We will treat her laceration and follow up neurological exam for minor head trauma, but I suspect the patient should be fine and will need to have her sutures removed in about a week and follow up in the office, hopefully, upon discharge for blood pressure management and heart rate evaluation.   ____________________________ Bobbie Stack Juliann Pares, MD ddc:cb D: 02/10/2013 15:07:53 ET T: 02/10/2013 15:23:10 ET JOB#: [id number]  cc: Dwayne D. Juliann Pares, MD, <Dictator> [name] MD ELECTRONICALLY SIGNED 03/04/2013 7:08

## 2015-03-14 NOTE — Discharge Summary (Signed)
PATIENT NAME:  Laura [name], Laura [name] MR#:  [id number] DATE OF BIRTH:  03/29/1924  DATE OF ADMISSION:  12/29/2013 DATE OF DISCHARGE:  01/10/2014  DISCHARGE DIAGNOSES: 1.  Acute nausea, vomiting, diarrhea that has resolved.  2.  Accelerated hypertension.  3.  Acute renal failure, that is resolving.  4.  Hyponatremia, that is acute on chronic.  5.  Acute on chronic anemia secondary to rectal bleeding, that has resolved.   DISCHARGE MEDICATIONS: 1.  Prilosec 20 mg p.o. daily 30 minutes before breakfast.  2.  Lisinopril 40 mg p.o. daily.  3.  Docusate 100 mg p.o. daily.  4.  Imdur 60 mg extended release 1 tab p.o. daily at 5:00 p.m.  5.  Clonidine 0.2 mg p.o. b.i.d.  6.  Acetaminophen 325 mg 1 to 2 tabs every 4 to 6 hours as needed for pain or fever.  7.  Labetalol 100 mg p.o. b.i.d.  8.  Magnesium oxide 400 mg p.o. daily.  9.  Hydralazine 25 mg q.i.d.  10.  Furosemide 20 mg p.o. daily.  11.  Polyethylene glycol 17 grams p.o. daily.  12.  Sodium chloride 1000 mg tabs 1 tab p.o. t.i.d.   CONSULTANTS: Nephrology, Cardiology and GI.   PROCEDURES: The patient underwent a colonoscopy that showed diverticulosis. No active bleeding. Positive for internal hemorrhoids. Also had a bleeding scan that was positive bleeding from the hepatic flexure of the colon.    LABORATORY DATA: On day of discharge: Sodium 130, potassium 3.9, creatinine 1.44, hemoglobin 7.4.   BRIEF HOSPITAL COURSE:  Problems: 1.  Acute nausea, vomiting and diarrhea. The patient initially presented with symptoms of nausea, vomiting, diarrhea with positive hyponatremia and volume depletion. She was hydrated with IV fluids and responded very well. Her symptoms resolved.  2.  Accelerated hypertension. The patient has chronic history of elevated blood pressures, poorly controlled in the past with aggressive medications.  Hydralazine needed to be added to her regimen and her blood pressures have ranged 120s to 180 systolic. She remains  asymptomatic at this time.  3.  Acute renal failure: The patient's acute renal status declined secondary to over diuresis with a vaptan. On day of discharge, her creatinine did improve from 1.53 to 1.44. She has good urine output. We will need to follow up with this at the facility on Monday with a recheck of her creatinine GFR.  4.  Hyponatremia. This is a chronic issue. Her baseline is around 133. Her sodium was as low as 119. On day of discharge, it was 130. She was evaluated by Nephrology placed on a vaptan. She responded appropriately. Her sodium came up to 133. She is currently on sodium tablets and also on Lasix and is doing fine. We asked her to restrict her free water intake.  5.  Acute on chronic anemia secondary to rectal bleeding.  The patient had gross hematochezia during her hospital stay. Bleeding scan that was positive for bleeding at the hepatic flexure of the colon. Gastroenterology was consulted. Colonoscopy was performed by Dr. Shelle Ironein and he did find diverticulosis but no active bleeding. Her hemoglobin has stabilized and she has had no further bleeding. Hemoglobin on day of discharge is 7.4. We will need to recheck this on Monday as well.   DISPOSITION: The patient is in stable condition to be discharged to a rehab facility where she will need further physical therapy and nursing care.  Will follow up with Dr. Burnadette PopLinthavong when she is discharged from the facility.   ____________________________  Marisue Ivan, MD kl:dp D: 01/10/2014 07:54:01 ET T: 01/10/2014 08:13:41 ET JOB#: [id number]  cc: Marisue Ivan, MD, <Dictator> Marisue Ivan MD ELECTRONICALLY SIGNED 01/10/2014 10:52

## 2015-03-14 NOTE — Consult Note (Signed)
PATIENT NAME:  Laura [name], Laura [name] MR#:  [id number] DATE OF BIRTH:  1924-10-22  DATE OF CONSULTATION:  07/25/2014  PRIMARY PHYSICIAN:   Marisue Ivan, MD  CONSULTING PHYSICIAN:  Genasis Zingale D. Ambrose Wile, MD  INDICATION: Chest pain and hypertension.   HISTORY OF PRESENT ILLNESS:   The patient is an 81 year old white female with hypertension, osteoarthritis, in assisted living, started having chest pain presented, feels awake and alert but no distress. She had chest pain on and off the night before, substernal, sharp, intermittent without radiation, maximum was 10/10.  She had some headaches as well. No fever, chills, sweats, or palpitations.  She was short of breath, dyspneic at times.  Blood pressure was also high, over 200 systolic.  She was given nitroglycerin and clonidine patch.  It has improved the blood pressure somewhat.  She has had episodes of recurrent chest pain, shortness of breath symptoms, and trouble with her blood pressure and her sodium.  Cardiology consultation was recommended.   PAST MEDICAL HISTORY: Hypertension, paroxysmal supraventricular tachycardia, atrial fibrillation, osteoarthritis, GERD, scoliosis, recurrent malignant hypertension, hyponatremia, degenerative joint disease.   PAST SURGICAL HISTORY: Breast biopsy, partial hysterectomy, bilateral knee replacements.   FAMILY HISTORY: Hypertension.   ALLERGIES: AMIODARONE, CODEINE, PENICILLIN, SULFA   MEDICATIONS:  Prilosec 20 mg a day, polyethylene glycol 3350 at 17 grams p.o. daily,  magnesium oxide 400 mg p.o. daily, lisinopril 40 a day, labetalol 100 twice a day, Imdur 60 a day, vitamin B complex, vitamin C, folic acid all daily, hydralazine 100 mg 2 times a day, Lasix 20 mg daily, Colace 100 mg daily, clonidine 0.2 at 1 patch every week, Percocet 5/325 every 4 to 6 p.r.n.   REVIEW OF SYSTEMS:  Denies blackout spells or syncope.  Denies nausea or vomiting. Denies fever, chills, sweats. No weight loss, weight gain.  No  hemoptysis or hematemesis. Denies bright red blood per rectum.  She has had some shortness of breath, chest pain, weakness, fatigue, leg edema.   PHYSICAL EXAMINATION:  VITAL SIGNS: Blood pressure when I saw the patient was 180/70, pulse was 75, respiratory rate 16, afebrile.  HEENT: Normocephalic, atraumatic. Pupils equal and reactive to light.  NECK: Supple. No significant JVD, bruits or adenopathy.  LUNGS: Clear to auscultation and percussion. Some mild expiratory wheezing.  HEART: Regular rate and rhythm. Systolic ejection murmur at the apex 2/6. Positive S4. PMI nondisplaced.  ABDOMEN: Benign.  EXTREMITY: Within normal limits with 2+ edema.  NEUROLOGIC: Intact.  SKIN: Normal.   DIAGNOSTIC DATA:  CT of the chest and abdomen essentially negative except for diverticulosis. Chest x-ray shows mild pulmonary vascular congestion.  EKG normal sinus rhythm around 80, first degree AV block.  LABORATORY DATA:  Glucose 108, BUN 40, creatinine 1.98, sodium 128, potassium 3.9, chloride 93, bicarbonate of 25. White count 8.5, hemoglobin 10, platelets 264. D-dimer was elevated, troponin 0.02.   ASSESSMENT:  1.  Malignant hypertension, 2.  Hyponatremia. 3.  Acute on chronic renal failure. 4.  Anemia. 5.  Osteoarthritis. 6.  Shortness of breath. 7.  Edema.   PLAN:  1.  Agree with admit.  2.  Rule out for myocardial infarction. Follow up cardiac enzymes. Follow-up EKG.   3.  Continue blood pressure control with double labetalol. Continue hydralazine, increase Imdur, will continue therapy for malignant hypertension.  Consider amlodipine.  Continue clonidine patch may increase the dose from 0.2 to 0.3.   4.  Angina continue Imdur therapy.  Continue ACE inhibitor, aspirin therapy.   5.  Chest  pain, again Imdur.  6.  For hyperlipidemia, continue Zocor therapy.  7.  For renal insufficiency consult nephrology for further evaluation and management.  8.  For hypernatremia, consider whether endocrine  involvement for SIADH would be helpful.  This may be related to medication, especially Lasix.  We will have to correct her electrolytes.  9.  Shortness of breath. Continue inhalers as necessary. 10. History of atrial fibrillation, not a good anticoagulation candidate because of falls. The patient's rate appears to be regular at this point. Will consider further therapy if she has more tachycardia. Continue medical management now.      ____________________________ Bobbie Stackwayne D. Juliann Paresallwood, MD ddc:DT D: 07/29/2014 09:22:51 ET T: 07/29/2014 10:21:51 ET JOB#: [id number]  cc: Rana Hochstein D. Juliann Paresallwood, MD, <Dictator> Alwyn PeaWAYNE D Samera Macy MD ELECTRONICALLY SIGNED 09/05/2014 14:40

## 2015-03-14 NOTE — Consult Note (Signed)
Chief Complaint:  Subjective/Chief Complaint She states to be doing well and is ready to go home. Denies cp or sob   VITAL SIGNS/ANCILLARY NOTES: **Vital Signs.:   16-Oct-15 07:58  Pulse Pulse 61  Respirations Respirations 20  Systolic BP Systolic BP 703  Diastolic BP (mmHg) Diastolic BP (mmHg) 69  Mean BP 102  Pulse Ox % Pulse Ox % 95  Oxygen Delivery Room Air/ 21 %  *Intake and Output.:   16-Oct-15 08:00  Grand Totals Intake:  240 Output:      Net:  240 24 Hr.:  240  Oral Intake      In:  240  Percentage of Meal Eaten  100   Brief Assessment:  GEN well developed, well nourished, no acute distress   Cardiac Regular  murmur present  --Gallop   Respiratory normal resp effort  clear BS   Gastrointestinal Normal   Gastrointestinal details normal Soft  Nontender   EXTR negative cyanosis/clubbing, negative edema   Lab Results: Thyroid:  14-Oct-15 03:55   Thyroid Stimulating Hormone 1.73 (0.45-4.50 (IU = International Unit)  ----------------------- Pregnant patients have  different reference  ranges for TSH:  - - - - - - - - - -  Pregnant, first trimetser:  0.36 - 2.50 uIU/mL)  Routine Chem:  14-Oct-15 03:55   Hemoglobin A1c ([facility]) 5.1 (The American Diabetes Association recommends that a primary goal of therapy should be <7% and that physicians should reevaluate the treatment regimen in patients with HbA1c values consistently >8%.)  Cholesterol, Serum 137  Triglycerides, Serum 106  HDL (INHOUSE)  38  LDL Cholesterol Calculated 78 (Result(s) reported on 03 Sep 2014 at 05:16AM.)    16:00   Glucose, Serum 97  BUN  40  Creatinine (comp)  2.30  Sodium, Serum 136  Potassium, Serum 4.5  Chloride, Serum 103  CO2, Serum 26  Calcium (Total), Serum 8.7  Anion Gap 7  Osmolality (calc) 282  eGFR (African American)  26  eGFR (Non-African American)  21 (eGFR values <41m/min/1.73 m2 may be an indication of chronic kidney disease (CKD). Calculated eGFR, using the  MRDR Study equation, is useful in  patients with stable renal function. The eGFR calculation will not be reliable in acutely ill patients when serum creatinine is changing rapidly. It is not useful in patients on dialysis. The eGFR calculation may not be applicable to patients at the low and high extremes of body sizes, pregnant women, and vetetarians.)  16-Oct-15 03:56   Glucose, Serum 87  BUN  41  Creatinine (comp)  2.27  Sodium, Serum 137  Potassium, Serum 4.3  Chloride, Serum 101  CO2, Serum 28  Calcium (Total), Serum 8.6  Anion Gap 8  Osmolality (calc) 283  eGFR (African American)  26  eGFR (Non-African American)  22 (eGFR values <614mmin/1.73 m2 may be an indication of chronic kidney disease (CKD). Calculated eGFR, using the MRDR Study equation, is useful in  patients with stable renal function. The eGFR calculation will not be reliable in acutely ill patients when serum creatinine is changing rapidly. It is not useful in patients on dialysis. The eGFR calculation may not be applicable to patients at the low and high extremes of body sizes, pregnant women, and vetetarians.)  Routine Hem:  16-Oct-15 03:56   Platelet Count (CBC) 209 (Result(s) reported on 05 Sep 2014 at 05[facility]   Radiology Results: XRay:    13-Oct-15 05:22, Chest Portable Single View  Chest Portable Single View   REASON FOR EXAM:  Left Side Chest pain  COMMENTS:       PROCEDURE: DXR - DXR PORTABLE CHEST SINGLE VIEW  - Sep 02 2014  5:22AM     CLINICAL DATA:  LEFT-sided chest pain beginning this morning,  shortness of breath.    EXAM:  PORTABLE CHEST - 1 VIEW    COMPARISON:  CT of the chest August 01, 2014    FINDINGS:  The cardiac silhouette appears moderately enlarged. Tortuous  calcified aorta. Mild interstitial prominence. Elevated RIGHT  diaphragm. No pleural effusions. Patchy RIGHT midlung zone airspace  opacity. No pneumothorax. Osteopenia. Scoliosis.     IMPRESSION:  Stable  cardiomegaly. Mild interstitial prominence suggests pulmonary  edema. Patchy RIGHT midlung zone airspace opacity could reflect  atelectasis or possibly confluent edema.      Electronically Signed    By: Elon Alas    On: 09/02/2014 05:45       Verified By: Ricky Ala, M.D.,  Cardiology:    13-Oct-15 04:53, ED ECG  Ventricular Rate 137  Atrial Rate 137  P-R Interval 112  QRS Duration 86  QT 334  QTc 504  R Axis -36  T Axis 80  ECG interpretation   Sinus tachycardia  Left axis deviation  ST & T wave abnormality, consider lateral ischemia  Abnormal ECG  When compared with ECG of 27-Jul-2014 08:51,  Vent. rate has increased BY  62 BPM  ST now depressed in Inferior leads  ST now depressed in Anterior leads  Inverted T waves have replaced nonspecific T wave abnormality in Lateral leads  ----------unconfirmed----------  Confirmed by OVERREAD, NOT ([id number]), editor PEARSON, BARBARA ([id number]) on 09/03/2014 12:30:08 PM  ED ECG    Assessment/Plan:  Assessment/Plan:  Assessment IMP Angina  non-Q-wave myocardial infarction  coronary disease  labile hypertension  acute on chronic renal insufficiency  congestive heart failure diastolic dysfunction  anemia  hyperlipidemia  paroxysmal atrial fibrillation .   Plan PLAN Agree with clonidine for Bp  recommend low-dose aspirin therapy 81 mg a day  blood pressure control agree with amlodipine  statin therapy  increase activity with walking  agree with Nephrology and improved for renal sufficiency  Lasix therapy for a mild heart failure  do not recommend cardiac catheterization or invasive procedure  do not recommend high level anticoagulation for AFib follow-up with Cardiology 1-2 weeks as outpatient   Electronic Signatures: Lujean Amel D (MD)  (Signed 16-Oct-15 13:21)  Authored: Chief Complaint, VITAL SIGNS/ANCILLARY NOTES, Brief Assessment, Lab Results, Radiology Results, Assessment/Plan   Last Updated:  16-Oct-15 13:21 by Yolonda Kida (MD)

## 2015-03-14 NOTE — Discharge Summary (Signed)
PATIENT NAME:  Laura [name], Laura [name] MR#:  [id number] DATE OF BIRTH:  [birth year]  DATE OF ADMISSION:  07/22/2014 DATE OF DISCHARGE:  08/11/2014  DISCHARGE DIAGNOSES: 1.  Malignant hypertension.  2.  Acute on chronic renal failure with creatinine of 2.36 on day of discharge.  3.  Acute on chronic hyponatremia with a sodium of 132 on day of discharge.  4.  Acute bronchitis, resolved.  5.  Acute diastolic congestive heart failure exacerbation.  6.  Acute on chronic anemia requiring 2 units of packed red blood cells on 08/06/2014.   DISCHARGE MEDICATIONS: 1.  Docusate sodium 100 mg p.o. daily.  2.  Acetaminophen 325 mg 2 tabs q. 4 to 6 hours as needed for pain and fever.  3.  MiraLax 17 grams daily.  4.  Magnesium oxide 400 mg p.o. daily.  5.  Hydralazine 100 mg p.o. t.i.d.  6.  Iron supplementation once daily.  7.  Clonidine 0.3 mg patch weekly.  8.  Imdur 120 mg p.o. daily.  9.  Labetalol 300 mg p.o. b.i.d.  10.  Simvastatin 40 mg p.o. at bedtime. 11.  Torsemide 20 mg p.o. daily.  12.  Pantoprazole 40 mg p.o. daily.   MEDICATIONS TO HOLD: Lisinopril due to the acute on chronic renal failure.   CONSULTATIONS: Cardiology per Dr. Juliann Paresallwood, nephrology per Dr. Thedore MinsSingh.   PERTINENT DIAGNOSTIC DATA: On day of discharge, sodium 132, potassium 4.5, creatinine 2.36, BUN 79, and hemoglobin 7.9.   Echocardiogram showed an EF greater than 75%, but consistent with diastolic changes.   BRIEF HOSPITAL COURSE:  1.  Malignant hypertension. The patient initially came in with acutely elevated blood pressure, systolics greater than 200s. Her norm is between the 100s to 190s. She was asymptomatic during that time. She did return to that and continues to fluctuate. She was evaluated by both cardiology and nephrology and changes were to labetalol which is increased from 300 daily to 300 b.i.d., also changes to the Imdur which was increased to 120. Her lisinopril was held due to the acute renal failure.  2.  Acute  on chronic renal failure. Her creatinine increased from a baseline of 1.7 up to 2.3 on day of discharge. She was seen by nephrology, had multiple studies done. Unclear etiology at this point, likely related to underlying malignant hypertension. She is still making urine at this time and no mental status changes. Will need close followup with nephrology as an outpatient. She did have a bump in her creatinine due to aggressive diuresis because of her fluid overload due to her underlying diastolic congestive heart failure.  3.  Acute bronchitis. The patient had episodes of wheezing and was placed on antibiotic therapy, azithromycin x5 days and also placed on prednisone taper and symptoms did improve.  4.  Acute on chronic hyponatremia. Baseline is around 131. After acute aggressive diuresis her sodium did drop down as low as to 124. She was given tolvaptan per nephrology and sodiums have come up significantly. Because she was slightly fluid overloaded, she was placed on oral torsemide and will need to follow her sodium as an outpatient. My recommendation is to check the creatinine and her electrolytes every 2 to 3 days. 5.  Acute on chronic anemia. This is likely secondary to underlying kidney disease. Her hemoglobin dropped as low as 6.1. She was transfused 2 units on the 16th of September and on day of discharge her hemoglobin was 7.9. This should also be checked every 2 to 3 days. Her  aspirin and her heparin were all stopped during her hospital stay. 6.  Acute diastolic congestive heart failure. This is a new diagnosis, confirmed by echo on the 12th. The patient had x-rays that conveyed pleural effusions and pulmonary edema, also has peripheral edema consistent with diastolic congestive heart failure. She has been diuresed and currently stable at this time and will continue with the torsemide. Unable to place her back on an ACE inhibitor because of her worsening renal function. Will need to follow her breathing  status and her volume status as an outpatient closely.   DISPOSITION: She is in stable condition and will be discharged to a SNF because of her weakness. She will need nursing care and also physical therapy. As soon as she gets out, she will need to follow up with Dr. Burnadette Pop and I would recommend her seeing nephrology within 10 days of discharge.   TOTAL TIME SPENT: For discharge including family counseling and discussions and answering her questions was greater than 30 minutes. ____________________________ Marisue Ivan, MD kl:sb D: 08/11/2014 08:36:52 ET T: 08/11/2014 09:25:53 ET JOB#: [id number]  cc: Marisue Ivan, MD, <Dictator> Marisue Ivan MD ELECTRONICALLY SIGNED 09/08/2014 8:29

## 2015-03-14 NOTE — Consult Note (Signed)
Chief Complaint:  Subjective/Chief Complaint Pt denieschest pain but c/o of mild sob today. No cough no leg swelling.Ambulated to bathroom well. Sitting up in chair.   VITAL SIGNS/ANCILLARY NOTES: **Vital Signs.:   14-Oct-15 11:30  Vital Signs Type Routine  Temperature Temperature (F) 98.4  Celsius 36.8  Temperature Source oral  Pulse Pulse 75  Respirations Respirations 18  Systolic BP Systolic BP 166  Diastolic BP (mmHg) Diastolic BP (mmHg) 95  Mean BP 131  Pulse Ox % Pulse Ox % 91  Pulse Ox Activity Level  At rest  Oxygen Delivery Room Air/ 21 %  *Intake and Output.:   14-Oct-15 11:15  Grand Totals Intake:  240 Output:      Net:  240 24 Hr.:  80  Oral Intake      In:  240  Percentage of Meal Eaten  50   Brief Assessment:  GEN well developed, well nourished, no acute distress   Cardiac Irregular  murmur present  --Gallop   Respiratory normal resp effort  clear BS  rhonchi   Gastrointestinal Normal   Gastrointestinal details normal Soft  Nontender  Nondistended   EXTR negative cyanosis/clubbing, negative edema   Lab Results: Thyroid:  14-Oct-15 03:55   Thyroid Stimulating Hormone 1.73 (0.45-4.50 (IU = International Unit)  ----------------------- Pregnant patients have  different reference  ranges for TSH:  - - - - - - - - - -  Pregnant, first trimetser:  0.36 - 2.50 uIU/mL)  Cardiology:  13-Oct-15 04:53   Ventricular Rate 137  Atrial Rate 137  P-R Interval 112  QRS Duration 86  QT 334  QTc 504  R Axis -36  T Axis 80  ECG interpretation Sinus tachycardia Left axis deviation ST & T wave abnormality, consider lateral ischemia Abnormal ECG When compared with ECG of 27-Jul-2014 08:51, Vent. rate has increased BY  62 BPM ST now depressed in Inferior leads ST now depressed in Anterior leads Inverted T waves have replaced nonspecific T wave abnormality in Lateral leads ----------unconfirmed---------- Confirmed by OVERREAD, NOT ([id number]), editor PEARSON,  BARBARA ([id number]) on 09/03/2014 12:30:08 PM  Routine Chem:  13-Oct-15 05:00   Glucose, Serum  105  BUN  50  Creatinine (comp)  2.58  Sodium, Serum 137  Potassium, Serum 3.7  Chloride, Serum 101  CO2, Serum 26  Calcium (Total), Serum 8.8  Anion Gap 10  Osmolality (calc) 288  eGFR (African American)  23  eGFR (Non-African American)  19 (eGFR values <35m/min/1.73 m2 may be an indication of chronic kidney disease (CKD). Calculated eGFR, using the MRDR Study equation, is useful in  patients with stable renal function. The eGFR calculation will not be reliable in acutely ill patients when serum creatinine is changing rapidly. It is not useful in patients on dialysis. The eGFR calculation may not be applicable to patients at the low and high extremes of body sizes, pregnant women, and vetetarians.)  Result Comment TROPONIN - RESULTS VERIFIED BY REPEAT TESTING.  - NOTIFIED JULIE URGILES ON 09/02/14  - AT 00630OF ELEVATED RESULTS...qsd  - READ-BACK PROCESS PERFORMED.  Result(s) reported on 02 Sep 2014 at 05:54AM.  B-Type Natriuretic Peptide ([facility]  6100 (Result(s) reported on 02 Sep 2014 at 06:27AM.)    08:56   Result Comment Troponin - RESULTS VERIFIED BY REPEAT TESTING.  - Notified DDeedra Ehrich_0  on  - 09/02/14.SFJ  - READ-BACK PROCESS PERFORMED.  Result(s) reported on 02 Sep 2014 at 09:55AM.    12:55   Result Comment Troponin -  Previously called @ 0949 on 09/02/14  - by SFJ. SFJ  - RESULTS VERIFIED BY REPEAT TESTING.  Result(s) reported on 02 Sep 2014 at 01:44PM.  14-Oct-15 03:55   Hemoglobin A1c [facility]) 5.1 (The American Diabetes Association recommends that a primary goal of therapy should be <7% and that physicians should reevaluate the treatment regimen in patients with HbA1c values consistently >8%.)  Cholesterol, Serum 137  Triglycerides, Serum 106  HDL (INHOUSE)  38  VLDL Cholesterol Calculated 21  LDL Cholesterol Calculated 78 (Result(s) reported on 03 Sep 2014 at  05:16AM.)    16:00   Glucose, Serum 97  BUN  40  Creatinine (comp)  2.30  Sodium, Serum 136  Potassium, Serum 4.5  Chloride, Serum 103  CO2, Serum 26  Calcium (Total), Serum 8.7  Anion Gap 7  Osmolality (calc) 282  eGFR (African American)  26  eGFR (Non-African American)  21 (eGFR values <18m/min/1.73 m2 may be an indication of chronic kidney disease (CKD). Calculated eGFR, using the MRDR Study equation, is useful in  patients with stable renal function. The eGFR calculation will not be reliable in acutely ill patients when serum creatinine is changing rapidly. It is not useful in patients on dialysis. The eGFR calculation may not be applicable to patients at the low and high extremes of body sizes, pregnant women, and vetetarians.)  Cardiac:  13-Oct-15 05:00   Troponin I  0.45 (0.00-0.05 0.05 ng/mL or less: NEGATIVE  Repeat testing in 3-6 hrs  if clinically indicated. >0.05 ng/mL: POTENTIAL  MYOCARDIAL INJURY. Repeat  testing in 3-6 hrs if  clinically indicated. NOTE: An increase or decrease  of 30% or more on serial  testing suggests a  clinically important change)  CPK-MB, Serum  6.6 (Result(s) reported on 02 Sep 2014 at 06:27AM.)  CK, Total 98 (26-192 NOTE: NEW REFERENCE RANGE  12/23/2013)    08:56   Troponin I  3.50 (0.00-0.05 0.05 ng/mL or less: NEGATIVE  Repeat testing in 3-6 hrs  if clinically indicated. >0.05 ng/mL: POTENTIAL  MYOCARDIAL INJURY. Repeat  testing in 3-6 hrs if  clinically indicated. NOTE: An increase or decrease  of 30% or more on serial  testing suggests a  clinically important change)  CPK-MB, Serum  24.9 (Result(s) reported on 02 Sep 2014 at 09:45AM.)    12:55   Troponin I  7.90 (0.00-0.05 0.05 ng/mL or less: NEGATIVE  Repeat testing in 3-6 hrs  if clinically indicated. >0.05 ng/mL: POTENTIAL  MYOCARDIAL INJURY. Repeat  testing in 3-6 hrs if  clinically indicated. NOTE: An increase or decrease  of 30% or more on serial   testing suggests a  clinically important change)  CPK-MB, Serum  38.5 (Result(s) reported on 02 Sep 2014 at 01:33PM.)  Routine UA:  13-Oct-15 05:08   Color (UA) Yellow  Clarity (UA) Clear  Glucose (UA) Negative  Bilirubin (UA) Negative  Ketones (UA) Negative  Specific Gravity (UA) 1.006  Blood (UA) 1+  pH (UA) 7.0  Protein (UA) 100 mg/dL  Nitrite (UA) Negative  Leukocyte Esterase (UA) Negative (Result(s) reported on 02 Sep 2014 at 05:46AM.)  RBC (UA) 46 /HPF  WBC (UA) 2 /HPF  Bacteria (UA) TRACE  Epithelial Cells (UA) NONE SEEN  Result(s) reported on 02 Sep 2014 at 05:46AM.  Routine Hem:  13-Oct-15 05:00   Hemoglobin (CBC)  9.3  WBC (CBC) 4.8  RBC (CBC)  3.24  Hematocrit (CBC)  29.3  Platelet Count (CBC) 236 (Result(s) reported on 02 Sep 2014 at 05:30AM.)  MCV 91  MCH 28.8  MCHC  31.8  RDW  14.7   Radiology Results: XRay:    13-Oct-15 05:22, Chest Portable Single View  Chest Portable Single View   REASON FOR EXAM:    Left Side Chest pain  COMMENTS:       PROCEDURE: DXR - DXR PORTABLE CHEST SINGLE VIEW  - Sep 02 2014  5:22AM     CLINICAL DATA:  LEFT-sided chest pain beginning this morning,  shortness of breath.    EXAM:  PORTABLE CHEST - 1 VIEW    COMPARISON:  CT of the chest August 01, 2014    FINDINGS:  The cardiac silhouette appears moderately enlarged. Tortuous  calcified aorta. Mild interstitial prominence. Elevated RIGHT  diaphragm. No pleural effusions. Patchy RIGHT midlung zone airspace  opacity. No pneumothorax. Osteopenia. Scoliosis.     IMPRESSION:  Stable cardiomegaly. Mild interstitial prominence suggests pulmonary  edema. Patchy RIGHT midlung zone airspace opacity could reflect  atelectasis or possibly confluent edema.      Electronically Signed    By: Elon Alas    On: 09/02/2014 05:45       Verified By: Ricky Ala, M.D.,  Cardiology:    13-Oct-15 04:53, ED ECG  Ventricular Rate 137  Atrial Rate 137  P-R Interval  112  QRS Duration 86  QT 334  QTc 504  R Axis -36  T Axis 80  ECG interpretation   Sinus tachycardia  Left axis deviation  ST & T wave abnormality, consider lateral ischemia  Abnormal ECG  When compared with ECG of 27-Jul-2014 08:51,  Vent. rate has increased BY  62 BPM  ST now depressed in Inferior leads  ST now depressed in Anterior leads  Inverted T waves have replaced nonspecific T wave abnormality in Lateral leads  ----------unconfirmed----------  Confirmed by OVERREAD, NOT (100), editor PEARSON, BARBARA ([id number]) on 09/03/2014 12:30:08 PM  ED ECG    Assessment/Plan:  Assessment/Plan:  Assessment IMP NQMI CAD [place] CHF CRI HTN Anemia AFIB.   Plan PLAN Tele Bp control ( labile) Lasix for sob mild CHF NTG for angina Poor anticoug candidate F/U rnal function Consider Raxexa if more angina Rehab for ambulation I do not rec cardiac cath at this point Medical therapy   Electronic Signatures: Yolonda Kida (MD)  (Signed 15-Oct-15 07:17)  Authored: Chief Complaint, VITAL SIGNS/ANCILLARY NOTES, Brief Assessment, Lab Results, Radiology Results, Assessment/Plan   Last Updated: 15-Oct-15 07:17 by Yolonda Kida (MD)

## 2015-03-14 NOTE — H&P (Signed)
PATIENT NAME:  Laura [name], Laura [name] MR#:  [id number] DATE OF BIRTH:  06-09-1924  DATE OF ADMISSION:  07/22/2014  PRIMARY CARE PHYSICIAN:  Marisue Ivan, MD.  CHIEF COMPLAINT: Chest pain since last night.   HISTORY OF PRESENT ILLNESS: This is an 81 year old Caucasian female with a history of hypertension, osteoarthritis was seen from assisted living due to chest pain since last night. The patient is alert, awake, in no acute distress. The patient said that she started to have chest pain since last night. Chest pain is substernal, sharp, intermittent without radiation. Maximal was a 10/10. The patient also had a headache last night. The patient denies any fever or chills. No palpitations, orthopnea or nocturnal dyspnea. No leg edema. The patient denies any other symptoms. The patient's blood pressure was high at 210/110 and maximum was 233/95.  The patient was treated with nitroglycerin in the ED and a clonidine patch. Blood pressure decreased to 195/85.   PAST MEDICAL HISTORY: Hypertension, history of paroxysmal supraventricular tachycardia, osteoarthritis, GERD, scoliosis.   PAST SURGICAL HISTORY: Breast biopsy, partial hysterectomy, bilateral knee replacement.   FAMILY HISTORY: Hypertension.   ALLERGIES:  AMLODIPINE, CODEINE, PENICILLIN, SULFA DRUGS.  HOME MEDICATIONS: Prilosec 20 mg p.o. daily, polyethylene glycol 3350, seventeen grams p.o. daily, magnesium oxide 400 mg p.o. daily, lisinopril 40 mg p.o. daily, labetalol 100 mg p.o. b.i.d., Imdur 60 mg 1 tablet once a day. Vitamin B complex with C, folic acid and iron, oral tablets, 1 tablet once daily. Hydralazine 100 mg p.o. t.i.d., Lasix 20 mg p.o. daily, Colace 100 mg p.o. daily, clonidine 0.2 mg 1 patch transdermal once a week Percocet 325/5 mg 2 tablets p.o. q. 4-6 hours p.r.n.   REVIEW OF SYSTEMS:  CONSTITUTIONAL: The patient denies any fever, chills, but has a headache; no dizziness or weakness.  EYES: No double vision, blurry vision.   ENT: No postnasal drip, slurred speech or dysphagia.  CARDIOVASCULAR: Positive for chest pain. No palpitations, orthopnea, nocturnal dyspnea. No leg edema.  PULMONARY: No cough, sputum, but has shortness of breath. No hemoptysis.  GASTROINTESTINAL: No abdominal pain, nausea, vomiting, diarrhea. No melena or bloody stool.  GENITOURINARY: No dysuria, hematuria, or incontinence.  SKIN: No rash or jaundice.  NEUROLOGIC: No syncope, loss of consciousness, or seizure.  HEMATOLOGY: No easy bleeding or bleeding.  ENDOCRINOLOGY: No polyuria, polydipsia, heat or cold intolerance.   PHYSICAL EXAMINATION:  VITAL SIGNS: Temperature 98.8, blood pressure 233/95, now decreased to 179/71, pulse 62, oxygen saturation 100% on room air.  GENERAL: The patient is alert, awake, oriented, in no acute distress.  HEENT: Pupils round, equal and reactive to light and accommodation. Mild dry oral mucosa. Clear oropharynx.  NECK: Supple. No JVD or carotid bruit. No lymphadenopathy. No thyromegaly.  CARDIOVASCULAR: S1, S2 regular rate, rhythm. No murmurs, gallops. PULMONARY: Bilateral air entry. No wheezing or rales. No use of accessory muscle to breathe.  ABDOMEN: Soft. No distention or tenderness. No organomegaly. Bowel sounds present.  EXTREMITIES: No edema, clubbing or cyanosis. No calf tenderness. Bilateral pedal pulses present.  SKIN: No rash or jaundice.  NEUROLOGIC: Alert and oriented x 3. No focal deficit. Power 5/5. Sensation intact.   LABORATORY DATA: Glucose 108, BUN 40, creatinine 1.98, sodium 128, potassium 3.9, chloride 93, bicarbonate 25, calcium 9.7, WBC 8.5, hemoglobin 10.0, platelets 264,000, D-dimer 2349, troponin 0.02 and 0.03.   CAT scan of the chest and the abdomen showed cardiomegaly, pulmonary artery calcifications, mild right perinephric fat stranding.  Large gallbladder and/or stones. No cholecystitis. Diverticulosis  but no diverticulitis.   Chest x-ray showed cardiomegaly with mild diffuse  pulmonary vascular congestion without overt pulmonary edema.   EKG showed normal sinus rhythm in the 80s, with 1st degree AV block.   IMPRESSIONS:  1.  Malignant hypertension.  2.  Acute renal failure on chronic kidney disease.  3.  Chronic hyponatremia.  4.  Anemia.  5.  Osteoarthritis.   PLAN OF TREATMENT:  1.  The patient will be admitted to the telemetry floor. We will hold lisinopril and Lasix due to renal failure and start IV fluid, normal saline at 75 mL per hour. Follow BMP.  2.  For malignant hypertension, the patient was treated with labetalol IV 20 mg 1 dose in the ED.  We will continue the patient's hypertension medication including p.o. labetalol, Imdur, hydralazine and give IV hydralazine p.r.n.  3.  For chest pain will give aspirin and Zocor, follow up troponin level, though chest pain is possibly due to malignant hypertension. So far no signs of acute coronary syndrome.  4.  I discussed the patient's condition and plan of treatment with the patient and the patient's daughter.   CODE STATUS: DNR, confirmed by her daughter.   TIME SPENT: About 58 minutes.   ____________________________ Shaune PollackQing Ashmi Blas, MD qc:lt D: 07/22/2014 13:26:11 ET T: 07/22/2014 13:59:31 ET JOB#: [id number]  cc: Shaune PollackQing Elic Vencill, MD, <Dictator> Shaune PollackQING Aicha Clingenpeel MD ELECTRONICALLY SIGNED 07/22/2014 15:56

## 2015-03-14 NOTE — Consult Note (Signed)
PATIENT NAME:  Laura [name], Laura [name] MR#:  [id number] DATE OF BIRTH:  1924/08/15  DATE OF CONSULTATION:  09/02/2014  CONSULTING PHYSICIAN:  Gabrille Kilbride D. Juliann Pares, MD  PRIMARY PHYSICIAN:  Marisue Ivan, MD   INDICATION FOR ADMISSION:  Unstable angina, non-Q-wave myocardial infarction, malignant hypertension.    HISTORY OF PRESENT ILLNESS:  Miss Chauncey is an 81 year old white female who presented to the Emergency Room via EMS after complaining of acute substernal chest pain that woke her up from sleep. The patient was short of breath, she stated that she had midsternal chest pain 10 out of 10, which lasted for several minutes, continued for 1-2 hours before she decided to come to the Emergency Room, mostly left breast area.  It did not radiate. No sweating, no blackout spells, or syncope. She received Lopressor in the Emergency Room for elevated blood pressure and her pain.  She was slightly tachycardic, subsequently developed elevated troponin. While was on telemetry took a few hours for the pain to go away. The patient is now pain-free by the time I saw her.   REVIEW OF SYSTEMS: No blackout spells or syncope.  No nausea or vomiting.  No fever. No chills. No sweats. No weight loss or weight gain.  No hemoptysis or hematemesis.  No bright red blood per rectum. She denies vision change or hearing change.  No significant sputum production.  Denies cough. She denies chest pain. Mild shortness of breath suggestive of angina.   PAST MEDICAL HISTORY: Malignant hypertension, hyponatremia,  history of SVT, atrial fibrillation, gastroenteritis, osteoarthritis, scoliosis, pulmonary hypertension, severe valvular disease, coronary artery disease, hyperlipidemia.   PAST SURGICAL HISTORY: Breast biopsy, partial hysterectomy, bilateral total knee replacement.   FAMILY HISTORY: Hypertension.   SOCIAL HISTORY: Lives alone, in assisted living No smoking or alcohol consumption.    MEDICATIONS: Tylenol p.r.n., clonidine  0.3 once a week, Imdur 120 mg once a day, simvastatin 40 at bedtime, labetalol 300 twice a day, furosemide 1 mg daily, Hematogen  Forte 1 capsule daily, Dulcolax 100 mg a day, Protonix 40 mg a day, hydralazine 100 mg 2 times a day.   ALLERGIES: CODEINE,  AMLODIPINE, SULFA, PENICILLIN, HCTZ, CIPRO.   LABORATORY DATA: Glucose 105. BNP 6000. BUN of 50, creatinine 2.58, sodium 137, potassium 3.7, chloride 101, CO2 of 26, calcium 8.8. Troponin 0.45. White count of 4.8, hemoglobin of 9.3, hematocrit 29. UA was unremarkable. Chest x-ray stable cardiomegaly, mild interstitial congestion. EKG, atrial fibrillation, nonspecific ST-T changes, rate initially 120. Follow up EKG with rate of 53.    PHYSICAL EXAMINATION:   VITAL SIGNS: Blood pressure 180/100, pulse of 120 and irregular, respiratory rate of 16, afebrile.  HEENT: Normocephalic, atraumatic. Pupils equal and reactive to light.  NECK: Supple.  No significant JVD  or adenopathy.  LUNGS:   Clear to auscultation and percussion.  No significant wheeze, rhonchi, or rales.  HEART: Irregular rhythm, irregular at times, systolic ejection murmur at left sternal border. Positive S4.  ABDOMEN: Benign.  EXTREMITIES: Within normal limits.  NEUROLOGIC: Intact.  SKIN: Normal.    ASSESSMENT:   1.  Malignant hypertension.  2.  Unstable angina.  3.  Possible non-Q-wave myocardial infarction.   4.  Renal insufficiency.    5.  Hyperlipidemia.   6.  Atrial fibrillation.  7.  Coronary artery disease.    8.  Abnormal electrocardiogram.   9.  Arthritis.    10.  Gastroesophageal reflux disease.   PLAN:   1.  Agree with admit. Rule out for  myocardial infarction. Follow up cardiac enzymes, follow up EKG.  Place on telemetry. Short-term anticoagulation. Continue Imdur. Consider adding Ranexa.  Continue to treat for unstable angina and possible non-Q-wave myocardial infarction medically. Do not recommend cardiac catheterization especially because of renal insufficiency  and comorbid disease.    2.  Malignant hypertension. Continue blood pressure control   blood pressure at 140 systolic and diastolic under 80. Continue aggressive medical therapy. She has labile hypertension, but we will try to treat it aggressively to help with renal insufficiency as well as myocardial ischemia.   3.  Coronary artery disease. Continue to follow troponins. Continue short-term anticoagulation, continue rate control, continue medications to help with symptoms of   continue rate management. Consider antiarrhythmic.  She is not a good long-term anticoagulation candidate because of bleeding issues. Will continue to follow and treat accordingly, continue telemetry to watch arrhythmia.   4.  Renal insufficiency. Would recommend mild hydration, followup with nephrology, avoid nephrotoxic drugs. Control blood pressure to help with further damage to the kidneys. Do not recommend dialysis at this stage.  5.  Fluid balance may have mild failure. Continue current therapy. I am worried that this may be an ischemic event resulting in diastolic dysfunction and heart failure rather than fluid overload.  6.  Arthritis. Continue symptomatic therapy.  6.  For reflux continue GERD therapy with Protonix.  7.  For anemia continue to follow up H and H.  We will continue to treat the patient conservatively medically, but aggressively in a medical standpoint. Do not recommend intervention or invasive procedures at this stage. The patient is asymptomatic right now with bradycardia. We will continue to hopefully treat in this condition with further aggressive therapy.     ____________________________ Bobbie Stackwayne D. Juliann Paresallwood, MD ddc:bu D: 09/02/2014 17:49:19 ET T: 09/02/2014 19:38:06 ET JOB#: [id number]  cc: Collyns Mcquigg D. Juliann Paresallwood, MD, <Dictator> Alwyn PeaWAYNE D Quinlan Mcfall MD ELECTRONICALLY SIGNED 09/24/2014 10:55

## 2015-03-14 NOTE — Consult Note (Signed)
Chief Complaint:  Subjective/Chief Complaint Pt c/o of some sob last PM. Deniied cp. There was also some palpitations. Bp still up   VITAL SIGNS/ANCILLARY NOTES: **Vital Signs.:   05-Sep-15 11:27  Vital Signs Type Recheck  Temperature Temperature (F) 98.3  Celsius 36.8  Temperature Source oral  Pulse Pulse 117  Respirations Respirations 20  Systolic BP Systolic BP 956  Diastolic BP (mmHg) Diastolic BP (mmHg) 80  Mean BP 109  Pulse Ox % Pulse Ox % 99  Pulse Ox Activity Level  At rest  Oxygen Delivery 2L  *Intake and Output.:   05-Sep-15 12:40  Grand Totals Intake:  240 Output:      Net:  240 24 Hr.:  10  Oral Intake      In:  240  Percentage of Meal Eaten  100   Brief Assessment:  GEN well developed, well nourished, no acute distress   Cardiac Irregular  murmur present  + LE edema  --Gallop   Respiratory normal resp effort  clear BS   Gastrointestinal Normal   Gastrointestinal details normal Soft   EXTR negative cyanosis/clubbing, positive edema   Lab Results: Cardiology:  04-Sep-15 08:30   Ventricular Rate 65  Atrial Rate 65  P-R Interval 200  QRS Duration 90  QT 442  QTc 459  P Axis 74  R Axis -13  T Axis 59  ECG interpretation Normal sinus rhythm Moderate voltage criteria for LVH, may be normal variant Borderline ECG When compared with ECG of 22-Jul-2014 05:45, No significant change was found Confirmed by Kashus Karlen ([id number]) on 07/25/2014 2:18:56 PM  Overreader: Lujean Amel  Routine Chem:  04-Sep-15 04:11   Glucose, Serum 89  BUN  24  Creatinine (comp)  1.76  Sodium, Serum  133  Potassium, Serum 4.1  Chloride, Serum 99  Calcium (Total), Serum  8.4  Anion Gap 12  Osmolality (calc) 270  eGFR (African American)  30  eGFR (Non-African American)  26 (eGFR values <63m/min/1.73 m2 may be an indication of chronic kidney disease (CKD). Calculated eGFR is useful in patients with stable renal function. The eGFR calculation will not be  reliable in acutely ill patients when serum creatinine is changing rapidly. It is not useful in  patients on dialysis. The eGFR calculation may not be applicable to patients at the low and high extremes of body sizes, pregnant women, and vegetarians.)  Cardiac:  04-Sep-15 04:11   Troponin I 0.02 (0.00-0.05 0.05 ng/mL or less: NEGATIVE  Repeat testing in 3-6 hrs  if clinically indicated. >0.05 ng/mL: POTENTIAL  MYOCARDIAL INJURY. Repeat  testing in 3-6 hrs if  clinically indicated. NOTE: An increase or decrease  of 30% or more on serial  testing suggests a  clinically important change)  CK, Total 91 (26-192 NOTE: NEW REFERENCE RANGE  12/23/2013)  CPK-MB, Serum 3.0 (Result(s) reported on 25 Jul 2014 at 08:40AM.)    08:35   Troponin I 0.02 (0.00-0.05 0.05 ng/mL or less: NEGATIVE  Repeat testing in 3-6 hrs  if clinically indicated. >0.05 ng/mL: POTENTIAL  MYOCARDIAL INJURY. Repeat  testing in 3-6 hrs if  clinically indicated. NOTE: An increase or decrease  of 30% or more on serial  testing suggests a  clinically important change)  CK, Total 89 (26-192 NOTE: NEW REFERENCE RANGE  12/23/2013)  CPK-MB, Serum 3.1 (Result(s) reported on 25 Jul 2014 at 09:10AM.)    12:14   Troponin I 0.04 (0.00-0.05 0.05 ng/mL or less: NEGATIVE  Repeat testing in 3-6 hrs  if clinically indicated. >0.05 ng/mL: POTENTIAL  MYOCARDIAL INJURY. Repeat  testing in 3-6 hrs if  clinically indicated. NOTE: An increase or decrease  of 30% or more on serial  testing suggests a  clinically important change)  CK, Total 94 (26-192 NOTE: NEW REFERENCE RANGE  12/23/2013)  CPK-MB, Serum 3.5 (Result(s) reported on 25 Jul 2014 at 12:53PM.)    15:53   Troponin I 0.03 (0.00-0.05 0.05 ng/mL or less: NEGATIVE  Repeat testing in 3-6 hrs  if clinically indicated. >0.05 ng/mL: POTENTIAL  MYOCARDIAL INJURY. Repeat  testing in 3-6 hrs if  clinically indicated. NOTE: An increase or decrease  of 30% or more  on serial  testing suggests a  clinically important change)  CK, Total 92 (26-192 NOTE: NEW REFERENCE RANGE  12/23/2013)  CPK-MB, Serum  3.9 (Result(s) reported on 25 Jul 2014 at 04:39PM.)  Routine Hem:  04-Sep-15 04:11   WBC (CBC) 4.2  RBC (CBC)  2.71  Hemoglobin (CBC)  8.0  Hematocrit (CBC)  24.2  Platelet Count (CBC) 221  MCV 89  MCH 29.5  MCHC 33.0  RDW 13.4  Neutrophil % 67.2  Lymphocyte % 10.9  Monocyte % 10.3  Eosinophil % 10.6  Basophil % 1.0  Neutrophil # 2.8  Lymphocyte #  0.5  Monocyte # 0.4  Eosinophil # 0.4  Basophil # 0.0 (Result(s) reported on 25 Jul 2014 at 04:46AM.)   Radiology Results: XRay:    01-Sep-15 06:29, Chest Portable Single View  Chest Portable Single View   REASON FOR EXAM:    Chest pain  COMMENTS:       PROCEDURE: DXR - DXR PORTABLE CHEST SINGLE VIEW  - Jul 22 2014  6:29AM     CLINICAL DATA:  Chest pain, shortness of breath    EXAM:  PORTABLE CHEST - 1 VIEW    COMPARISON:  Prior radiograph from 01/07/2014    FINDINGS:  Cardiomegaly is stable from prior. Prominent tortuosity  intrathoracic aorta is also unchanged.  Lungs are mildly hypoinflated. There is mild diffuse pulmonary  vascular congestion without frank pulmonary edema. No pleural  effusion.No consolidative airspace disease identified. No  pneumothorax.    No acute osseous abnormality.     IMPRESSION:  Cardiomegaly with mild diffuse pulmonary vascular congestion without  overt pulmonary edema.      Electronically Signed    By: BenjaminMcClintock M.D.    On: 07/22/2014 06:38     Verified By: Neomia Glass, M.D.,  Cardiology:    01-Sep-15 05:45, ED ECG  Ventricular Rate 80  Atrial Rate 80  P-R Interval 218  QRS Duration 90  QT 392  QTc 452  P Axis 82  R Axis -29  T Axis 62  ECG interpretation   Sinus rhythm with 1st degree A-V block  Moderate voltage criteria for LVH, may be normal variant  Borderline ECG  When compared with ECG of 31-Dec-2013  08:40,  Vent. rate has increased BY  26 BPM  ----------unconfirmed----------  Confirmed by OVERREAD, NOT ([id number]), editor PEARSON, BARBARA ([id number]) on 07/22/2014 12:14:27 PM  ED ECG     04-Sep-15 08:30, ECG  Ventricular Rate 65  Atrial Rate 65  P-R Interval 200  QRS Duration 90  QT 442  QTc 459  P Axis 74  R Axis -13  T Axis 59  ECG interpretation   Normal sinus rhythm  Moderate voltage criteria for LVH, may be normal variant  Borderline ECG  When compared with ECG of 22-Jul-2014 05:45,  No significant change was found  Confirmed by Laila Myhre ([id number]) on 07/25/2014 2:18:56 PM    Overreader: Lujean Amel  ECG   CT:    01-Sep-15 06:49, CT Chest Abdomen and Pelvis WO  CT Chest Abdomen and Pelvis WO   REASON FOR EXAM:    (1) chest pain with radiation to her back eval; (2)   chest pain with radiation to  COMMENTS:       PROCEDURE: CT  - CT CHEST ABDOMEN AND PELVIS WO  - Jul 22 2014  6:49AM     CLINICAL DATA:  Chest pain, radiating into the back.    EXAM:  CT CHEST, ABDOMEN AND PELVIS WITHOUT CONTRAST    TECHNIQUE:  Multidetector CT imaging of the chest, abdomen and pelvis was  performed following the standard protocol without IV contrast.  COMPARISON:  03/03/2006 abdominal pelvic CT.    FINDINGS:  CT CHEST FINDINGS    Ectatic ascending aorta. Scattered atherosclerotic disease of the  thoracic aorta and branch vessels. Nonspecific thyroid nodules.    Cardiomegaly. Coronary artery and aortic valvular calcifications.  Trace pericardial fluid. Small right pleural effusion. No  intrathoracic lymphadenopathy.    Central airways are patent. No pneumothorax. Narrowing of right  lower lobe bronchi (series 4, image 34). Prominent right pulmonary  artery and veins above the bronchi. Mild scarring at the lung bases.  Osteopenia.  Multilevel degenerative changes.    CT ABDOMEN AND PELVIS FINDINGS    Organ evaluation is limited in the absence of intravenous contrast.  Within  this limitation, hepatic granuloma. No appreciable  abnormality of the spleen, pancreas, adrenal glands. Gallbladder  sludge or noncalcified stones. No biliary ductal dilatation.    Bilateral renal cysts and indeterminate hyperdense lesions. No  hydroureteronephrosis. Nonspecific mild right perinephric fat  stranding.    Colonic diverticulosis. No CT evidence for colitis or  diverticulitis. Appendix not identified. No right lower quadrant  inflammation. Small bowel loops are of normal course and caliber.  Fat containing umbilical hernia.    Thin walled bladder.  Absent uterus.  No adnexal mass.    Limited vascular evaluation in the absence of intravenous contrast.  Aortic tortuosity and scattered atherosclerotic disease of the aorta  and branch vessels without aneurysmal dilatation.    Osteopenia and multilevel degenerative changes. Curvature of the  spine.     IMPRESSION:  Cardiomegaly.  Coronary artery calcifications.  Scattered atherosclerotic disease of the aorta and branch vessels  without aneurysmal dilatation. Cannot evaluate for dissection in the  absence of intravenous contrast.    Small right pleural effusion. Associated airspace consolidation;  atelectasis versus pneumonia.    Narrowing of right lower lobe bronchi, nonspecific. Recommend  short-term follow-up chest CT.    Mild right perinephric fat stranding. Correlate with urinalysis to  exclude ascending infection.    Gallbladder sludge and/or stones.  No CT evidence for cholecystitis.  Diverticulosis.  No CT evidence for diverticulitis.    Curvature and multilevel degenerative changes of the spine. No  definite acute osseous finding.      Electronically Signed    By: Carlos Levering M.D.    On: 07/22/2014 07:08         Verified By: Tommi Rumps, M.D.,   Assessment/Plan:  Assessment/Plan:  Assessment IMP Malignant HTN AFIB ARI/CRI Vertigo SOB DJD Hyperlipidemia Hx Falls . Marland Kitchen    Plan PLAN Control HTN by increasing Labetotol 200 mg po bid Continue Hydralazine 100 tid Agree withLisinopril 40 daily  AFIB statrt Amiodarone IV load No long term anticoug because of multiple falls Consider low dose lasix to help with sob Dyspnea probably related to AFIB-RVR If she converts then d/c Amino Rec medical therapy for now   Electronic Signatures: Yolonda Kida (MD)  (Signed 06-Sep-15 07:10)  Authored: Chief Complaint, VITAL SIGNS/ANCILLARY NOTES, Brief Assessment, Lab Results, Radiology Results, Assessment/Plan   Last Updated: 06-Sep-15 07:10 by Lujean Amel D (MD)

## 2015-03-14 NOTE — Consult Note (Signed)
Brief Consult Note: Diagnosis: Angina/Malignant HTN.   Patient was seen by consultant.   Consult note dictated.   Orders entered.   Comments: IMP Angina Malignant HTN ARI/CRI Hyperlipidemia HypoNa DJD Paroxymal AFIB Falls . PLAN Tele Control Bp with labetalol/Imdur/Hydralazine/Lisinopril F/U renal function Increase Imdur for cp Short term anticoug only/Poor long term risk because of falls Continue zocor for lipids Follow Na because of hypoNa I do not rec cath at this point Continue aggressive medical therapy.  Electronic Signatures: Dorothyann Pengallwood, Dwayne D (MD)  (Signed 06-Sep-15 07:20)  Authored: Brief Consult Note   Last Updated: 06-Sep-15 07:20 by Alwyn Peaallwood, Dwayne D (MD)

## 2015-03-14 NOTE — Discharge Summary (Signed)
PATIENT NAME:  Laura [name], Laura [name] MR#:  [id number] DATE OF BIRTH:  1924/07/08  DATE OF ADMISSION:  12/29/2013 DATE OF DISCHARGE:  01/10/2014   Addendum  CORRECTION TO MEDICATION: There is a correction to the hydralazine. Please mark through the hydralazine 25 mg q.i.d. The patient should be on hydralazine 100 mg p.o. t.i.d.   ____________________________ Marisue IvanKanhka Tayari Yankee, MD kl:lb D: 01/10/2014 09:01:59 ET T: 01/10/2014 09:14:08 ET JOB#: [id number]  cc: Marisue IvanKanhka Naidelin Gugliotta, MD, <Dictator> Marisue IvanKANHKA Shonte Soderlund MD ELECTRONICALLY SIGNED 01/10/2014 10:52

## 2015-03-14 NOTE — Consult Note (Signed)
Details:   - GI Note.  Colonoscopy done today for rectal bleeding, drop in Hgb.  Findings:  many diverticula,  including in R colon and HF.  No active bleeding. Some old blood.  - Source of bleeding diverticular  Recs:  -safe for d/c from GI bleed standpoint - would only consider surgery for recurrent episodes of LGIB.   Electronic Signatures: Dow Adolphein, Matthew (MD)  (Signed 13-Feb-15 10:30)  Authored: Details   Last Updated: 13-Feb-15 10:30 by Dow Adolphein, Matthew (MD)

## 2015-03-14 NOTE — Consult Note (Signed)
Chief Complaint:  Subjective/Chief Complaint Wheezing worst todsay.Pt c/o of some sob last PM. Deniied cp. There was also some palpitations. Bp improved.   VITAL SIGNS/ANCILLARY NOTES: **Vital Signs.:   06-Sep-15 12:46  Vital Signs Type Post Medication  Pulse Pulse 70  Pulse source if not from Vital Sign Device per cardiac monitor  Systolic BP Systolic BP 027  Diastolic BP (mmHg) Diastolic BP (mmHg) 71  Mean BP 104  *Intake and Output.:   06-Sep-15 14:20  Grand Totals Intake:   Output:  200    Net:  -200 24 Hr.:  2123  Urine ml     Out:  200  Urinary Method  Void; BSC   Brief Assessment:  GEN well developed, well nourished, no acute distress   Cardiac Irregular  murmur present  + LE edema  --Gallop   Respiratory normal resp effort  wheezing  rhonchi   Gastrointestinal Normal   Gastrointestinal details normal Soft   EXTR negative cyanosis/clubbing, positive edema   Lab Results: Cardiology:  04-Sep-15 08:30   Ventricular Rate 65  Atrial Rate 65  P-R Interval 200  QRS Duration 90  QT 442  QTc 459  P Axis 74  R Axis -13  T Axis 59  ECG interpretation Normal sinus rhythm Moderate voltage criteria for LVH, may be normal variant Borderline ECG When compared with ECG of 22-Jul-2014 05:45, No significant change was found Confirmed by Caydance Kuehnle ([id number]) on 07/25/2014 2:18:56 PM  Overreader: Lujean Amel  Routine Chem:  04-Sep-15 04:11   Glucose, Serum 89  BUN  24  Creatinine (comp)  1.76  Sodium, Serum  133  Potassium, Serum 4.1  Chloride, Serum 99  Calcium (Total), Serum  8.4  Anion Gap 12  Osmolality (calc) 270  eGFR (African American)  30  eGFR (Non-African American)  26 (eGFR values <42m/min/1.73 m2 may be an indication of chronic kidney disease (CKD). Calculated eGFR is useful in patients with stable renal function. The eGFR calculation will not be reliable in acutely ill patients when serum creatinine is changing rapidly. It is not useful in   patients on dialysis. The eGFR calculation may not be applicable to patients at the low and high extremes of body sizes, pregnant women, and vegetarians.)  Cardiac:  04-Sep-15 04:11   Troponin I 0.02 (0.00-0.05 0.05 ng/mL or less: NEGATIVE  Repeat testing in 3-6 hrs  if clinically indicated. >0.05 ng/mL: POTENTIAL  MYOCARDIAL INJURY. Repeat  testing in 3-6 hrs if  clinically indicated. NOTE: An increase or decrease  of 30% or more on serial  testing suggests a  clinically important change)  CK, Total 91 (26-192 NOTE: NEW REFERENCE RANGE  12/23/2013)  CPK-MB, Serum 3.0 (Result(s) reported on 25 Jul 2014 at 08:40AM.)    08:35   Troponin I 0.02 (0.00-0.05 0.05 ng/mL or less: NEGATIVE  Repeat testing in 3-6 hrs  if clinically indicated. >0.05 ng/mL: POTENTIAL  MYOCARDIAL INJURY. Repeat  testing in 3-6 hrs if  clinically indicated. NOTE: An increase or decrease  of 30% or more on serial  testing suggests a  clinically important change)  CK, Total 89 (26-192 NOTE: NEW REFERENCE RANGE  12/23/2013)  CPK-MB, Serum 3.1 (Result(s) reported on 25 Jul 2014 at 09:10AM.)    12:14   Troponin I 0.04 (0.00-0.05 0.05 ng/mL or less: NEGATIVE  Repeat testing in 3-6 hrs  if clinically indicated. >0.05 ng/mL: POTENTIAL  MYOCARDIAL INJURY. Repeat  testing in 3-6 hrs if  clinically indicated. NOTE: An increase or  decrease  of 30% or more on serial  testing suggests a  clinically important change)  CK, Total 94 (26-192 NOTE: NEW REFERENCE RANGE  12/23/2013)  CPK-MB, Serum 3.5 (Result(s) reported on 25 Jul 2014 at 12:53PM.)    15:53   Troponin I 0.03 (0.00-0.05 0.05 ng/mL or less: NEGATIVE  Repeat testing in 3-6 hrs  if clinically indicated. >0.05 ng/mL: POTENTIAL  MYOCARDIAL INJURY. Repeat  testing in 3-6 hrs if  clinically indicated. NOTE: An increase or decrease  of 30% or more on serial  testing suggests a  clinically important change)  CK, Total 92 (26-192 NOTE: NEW  REFERENCE RANGE  12/23/2013)  CPK-MB, Serum  3.9 (Result(s) reported on 25 Jul 2014 at 04:39PM.)  Routine Hem:  04-Sep-15 04:11   WBC (CBC) 4.2  RBC (CBC)  2.71  Hemoglobin (CBC)  8.0  Hematocrit (CBC)  24.2  Platelet Count (CBC) 221  MCV 89  MCH 29.5  MCHC 33.0  RDW 13.4  Neutrophil % 67.2  Lymphocyte % 10.9  Monocyte % 10.3  Eosinophil % 10.6  Basophil % 1.0  Neutrophil # 2.8  Lymphocyte #  0.5  Monocyte # 0.4  Eosinophil # 0.4  Basophil # 0.0 (Result(s) reported on 25 Jul 2014 at 04:46AM.)   Radiology Results: XRay:    01-Sep-15 06:29, Chest Portable Single View  Chest Portable Single View   REASON FOR EXAM:    Chest pain  COMMENTS:       PROCEDURE: DXR - DXR PORTABLE CHEST SINGLE VIEW  - Jul 22 2014  6:29AM     CLINICAL DATA:  Chest pain, shortness of breath    EXAM:  PORTABLE CHEST - 1 VIEW    COMPARISON:  Prior radiograph from 01/07/2014    FINDINGS:  Cardiomegaly is stable from prior. Prominent tortuosity  intrathoracic aorta is also unchanged.  Lungs are mildly hypoinflated. There is mild diffuse pulmonary  vascular congestion without frank pulmonary edema. No pleural  effusion.No consolidative airspace disease identified. No  pneumothorax.    No acute osseous abnormality.     IMPRESSION:  Cardiomegaly with mild diffuse pulmonary vascular congestion without  overt pulmonary edema.      Electronically Signed    By: BenjaminMcClintock M.D.    On: 07/22/2014 06:38     Verified By: Neomia Glass, M.D.,  Cardiology:    01-Sep-15 05:45, ED ECG  Ventricular Rate 80  Atrial Rate 80  P-R Interval 218  QRS Duration 90  QT 392  QTc 452  P Axis 82  R Axis -29  T Axis 62  ECG interpretation   Sinus rhythm with 1st degree A-V block  Moderate voltage criteria for LVH, may be normal variant  Borderline ECG  When compared with ECG of 31-Dec-2013 08:40,  Vent. rate has increased BY  26 BPM  ----------unconfirmed----------  Confirmed by  OVERREAD, NOT ([id number]), editor PEARSON, BARBARA ([id number]) on 07/22/2014 12:14:27 PM  ED ECG     04-Sep-15 08:30, ECG  Ventricular Rate 65  Atrial Rate 65  P-R Interval 200  QRS Duration 90  QT 442  QTc 459  P Axis 74  R Axis -13  T Axis 59  ECG interpretation   Normal sinus rhythm  Moderate voltage criteria for LVH, may be normal variant  Borderline ECG  When compared with ECG of 22-Jul-2014 05:45,  No significant change was found  Confirmed by Neria Procter ([id number]) on 07/25/2014 2:18:56 PM    Overreader: Lujean Amel  ECG  CT:    01-Sep-15 06:49, CT Chest Abdomen and Pelvis WO  CT Chest Abdomen and Pelvis WO   REASON FOR EXAM:    (1) chest pain with radiation to her back eval; (2)   chest pain with radiation to  COMMENTS:       PROCEDURE: CT  - CT CHEST ABDOMEN AND PELVIS WO  - Jul 22 2014  6:49AM     CLINICAL DATA:  Chest pain, radiating into the back.    EXAM:  CT CHEST, ABDOMEN AND PELVIS WITHOUT CONTRAST    TECHNIQUE:  Multidetector CT imaging of the chest, abdomen and pelvis was  performed following the standard protocol without IV contrast.  COMPARISON:  03/03/2006 abdominal pelvic CT.    FINDINGS:  CT CHEST FINDINGS    Ectatic ascending aorta. Scattered atherosclerotic disease of the  thoracic aorta and branch vessels. Nonspecific thyroid nodules.    Cardiomegaly. Coronary artery and aortic valvular calcifications.  Trace pericardial fluid. Small right pleural effusion. No  intrathoracic lymphadenopathy.    Central airways are patent. No pneumothorax. Narrowing of right  lower lobe bronchi (series 4, image 34). Prominent right pulmonary  artery and veins above the bronchi. Mild scarring at the lung bases.  Osteopenia.  Multilevel degenerative changes.    CT ABDOMEN AND PELVIS FINDINGS    Organ evaluation is limited in the absence of intravenous contrast.  Within this limitation, hepatic granuloma. No appreciable  abnormality of the spleen, pancreas,  adrenal glands. Gallbladder  sludge or noncalcified stones. No biliary ductal dilatation.    Bilateral renal cysts and indeterminate hyperdense lesions. No  hydroureteronephrosis. Nonspecific mild right perinephric fat  stranding.    Colonic diverticulosis. No CT evidence for colitis or  diverticulitis. Appendix not identified. No right lower quadrant  inflammation. Small bowel loops are of normal course and caliber.  Fat containing umbilical hernia.    Thin walled bladder.  Absent uterus.  No adnexal mass.    Limited vascular evaluation in the absence of intravenous contrast.  Aortic tortuosity and scattered atherosclerotic disease of the aorta  and branch vessels without aneurysmal dilatation.    Osteopenia and multilevel degenerative changes. Curvature of the  spine.     IMPRESSION:  Cardiomegaly.  Coronary artery calcifications.  Scattered atherosclerotic disease of the aorta and branch vessels  without aneurysmal dilatation. Cannot evaluate for dissection in the  absence of intravenous contrast.    Small right pleural effusion. Associated airspace consolidation;  atelectasis versus pneumonia.    Narrowing of right lower lobe bronchi, nonspecific. Recommend  short-term follow-up chest CT.    Mild right perinephric fat stranding. Correlate with urinalysis to  exclude ascending infection.    Gallbladder sludge and/or stones.  No CT evidence for cholecystitis.  Diverticulosis.  No CT evidence for diverticulitis.    Curvature and multilevel degenerative changes of the spine. No  definite acute osseous finding.      Electronically Signed    By: Carlos Levering M.D.    On: 07/22/2014 07:08         Verified By: Tommi Rumps, M.D.,   Assessment/Plan:  Assessment/Plan:  Assessment IMP Wheezing  Malignant HTN AFIB ARI/CRI Vertigo SOB DJD Hyperlipidemia Hx Falls . Marland Kitchen   Plan PLAN Inhalers prn for wheezing Control HTN by increasing Labetotol 200 mg  po bid Continue Hydralazine 100 tid Agree withLisinopril 40 daily AFIB statrt Amiodarone IV load No long term anticoug because of multiple falls Consider low dose lasix to help  with sob Dyspnea probably related to AFIB-RVR Amio d/c'd for Sinus brady Rec medical therapy for now   Electronic Signatures: Yolonda Kida (MD)  (Signed 06-Sep-15 22:21)  Authored: Chief Complaint, VITAL SIGNS/ANCILLARY NOTES, Brief Assessment, Lab Results, Radiology Results, Assessment/Plan   Last Updated: 06-Sep-15 22:21 by Lujean Amel D (MD)

## 2015-03-14 NOTE — Consult Note (Signed)
PATIENT NAME:  Laura [name], Laura [name] MR#:  [id number] DATE OF BIRTH:  10/04/1924  DATE OF CONSULTATION:  01/01/2014  REFERRING PHYSICIAN:  Marisue Ivan, MD CONSULTING PHYSICIAN:  Dow Adolph, MD  REASON FOR THE CONSULT: Lower GI bleed.   HISTORY OF PRESENT ILLNESS: Laura [name] is an 81 year old female with a past medical history notable for hypertension, paroxysmal SVT, who was originally admitted to the hospital on 12/29/2013 with nausea, vomiting, lethargy, chest pain. She was found to be hyponatremic and has been treated for this while in hospital. Her hyponatremia has been gradually resolving.   Today she was noted to be more lethargic, and then also this morning was also noted to have one very large episode of bright red blood per rectum.   She had a hemoglobin this morning that was stable. She has remained hemodynamically stable with hypertension. In addition, she went for a tagged RBC scan, which did show a likely slow bleed in the right colon in the area of the hepatic flexure. There did not appear to be enough bleeding on this tagged RBC scan to go ahead with angiography.   Laura [name] and her son do not think she has ever had a colonoscopy before. She has also had no prior history of GI bleeds and does not have any family history of GI malignancy.   PAST MEDICAL HISTORY:   1.  Hypertension.  2.  Paroxysmal SVT.  3.  Osteoarthritis.  4.  GERD.  5.  Scoliosis.   ALLERGIES: AMLODIPINE, CODEINE, PENICILLIN, SULFA DRUGS.   SOCIAL HISTORY: She lives in an assisted living facility. She denies any alcohol, tobacco or recreational drugs.   FAMILY HISTORY: There is no family history of GI malignancy that they are aware of.    HOME MEDICATIONS:  1.  Prilosec 20 mg daily.  2.  Normodyne100 mg 2 times a day.  3.  Magnesium oxide 400 mg 2 times a day.  4.  Lisinopril 40 mg daily.  5.  Isosorbide mononitrate 1 tablet daily.  6.  Hydralazine 100 mg every 8 hours.  7.  Colace 100 mg  daily.  8.  Aspirin 81 mg daily.  REVIEW OF SYSTEMS:   CONSTITUTIONAL: No weight gain or weight loss.  No fever or chills. Slightly somnolent. Generalized weakness and fatigue.  HEENT: No oral lesions or sore throat. No vision changes. GASTROINTESTINAL: See HPI.  HEME/LYMPH: No easy bruising or bleeding. CARDIOVASCULAR: No chest pain or dyspnea on exertion. GENITOURINARY: No hematuria. INTEGUMENTARY: No rashes or pruritus PSYCHIATRIC: No depression/anxiety.  ENDOCRINE: No heat/cold intolerance, no hair loss or skin changes. ALLERGIC/IMMUNOLOGIC: Negative for hives. RESPIRATORY: No cough, no shortness of breath.  MUSCULOSKELETAL: No joint swelling or muscle pain.  PHYSICAL EXAMINATION: VITAL SIGNS: Temperature 98.5. Pulse is 57. Respirations are 18. Blood pressure 160/84. O2 sat is 97% on room air.  GENERAL: Alert and oriented times 4.  No acute distress. Appears stated age. Positive for mild lethargy.  HEENT: Normocephalic/atraumatic. Extraocular movements are intact. Anicteric. NECK: Soft, supple. JVP appears normal. No adenopathy. CHEST: Clear to auscultation. No wheeze or crackle. Respirations unlabored. HEART: Regular. No murmur, rub, or gallop.  Normal S1 and S2. ABDOMEN: Soft, nontender, nondistended.  Normal active bowel sounds in all four quadrants.  No organomegaly. No masses EXTREMITIES: No swelling, well perfused. SKIN: No rash or lesion. Skin color, texture, turgor normal. NEUROLOGICAL: Grossly intact. PSYCHIATRIC: Normal tone and affect. MUSCULOSKELETAL: No joint swelling or erythema.    LABORATORY DATA: Sodium is 127, potassium  3.9, BUN 14, creatinine 1.05. Her CK-MB is 4.9. Her total bilirubin is 0.8, alkaline phosphatase 68, AST 52, ALT 28. Her current hemoglobin is 10.3, with prior this morning being 10.0, white count 3.9, hematocrit 28; platelet count is 164. Her INR is 0.9. Her tagged RBC scan: Positive for slow bleeding source in the right colon.   ASSESSMENT:  Lower gastrointestinal bleed: She did have a tagged red blood cell scan positive for a slow bleed in the right colon in the area of the hepatic flexure. Per my discussions with radiology, it does not appear as though this bleed is fast enough to be treated with angiography. The most likely cause of this would be a diverticular bleed, although it would be important to rule out other causes of bleeding such as malignancy, angiectasia, ulceration.   PLAN: We will continue to monitor her hemodynamics and hemoglobin closely. If she does have further heavy bleeding, we will likely send her for angiography. Otherwise, we will plan for a colonoscopy on Friday with a slow prep and also allowing additional time for the hyponatremia to improve.   We will put her on clears starting today and all day on Thursday. She will do her prep Thursday afternoon and Friday morning.   Thank you for this consult.    ____________________________ Dow AdolphMatthew Derrel Moore, MD mr:jcm D: 01/01/2014 14:11:08 ET T: 01/01/2014 15:06:51 ET JOB#: [id number]  cc: Dow AdolphMatthew Keene Gilkey, MD, <Dictator> Kathalene FramesMATTHEW G Tiajah Oyster MD ELECTRONICALLY SIGNED 01/14/2014 13:47

## 2015-03-14 NOTE — H&P (Signed)
PATIENT NAME:  Laura [name], Laura [name] MR#:  [id number] DATE OF BIRTH:  [birth year]  DATE OF ADMISSION:  09/02/2014  REFERRING PHYSICIAN: Bayard Males, MD   PRIMARY CARE DOCTOR: Marisue Ivan, MD  ADMISSION DIAGNOSIS: Malignant hypertension.   HISTORY OF PRESENT ILLNESS: This is an 81 year old Caucasian female who presents to the Emergency Department via EMS after complaining of chest pain that awoke her from sleep tonight. The patient states that she waited approximately 2 hours prior to coming to the Emergency Department. Her chest was hurting over her left breast. The pain did not radiate and was 10 out of 10 in severity, per the patient. She denies any associated shortness of breath, but admits to diaphoresis. In the Emergency Department, nitroglycerin was placed and the patient received 2 doses of IV Lopressor which relieved her pain. The patient was found to have tachycardia as well as elevated cardiac enzymes, which prompted Emergency Department to call for admission.   REVIEW OF SYSTEMS: CONSTITUTIONAL: The patient denies fever or weakness.  EYES: Denies blurred vision or inflammation.  EARS, NOSE AND THROAT: Denies tinnitus or sore throat.  RESPIRATORY: Denies cough or shortness of breath.  CARDIOVASCULAR: Admits to chest pain that is now resolved. She denies palpitations.  GASTROINTESTINAL: Denies nausea and vomiting or abdominal pain.  GENITOURINARY: Denies dysuria, increased frequency or hesitancy.  ENDOCRINE: Denies polyuria or nocturia.  HEMATOLOGIC AND LYMPHATIC: Denies easy bleeding or bruising.  INTEGUMENT: Denies rashes or lesions.  MUSCULOSKELETAL: Denies myalgia but admits to arthralgias in her back.  NEUROLOGIC: Denies numbness in her extremities or difficulty speaking.  PSYCHIATRIC: Denies depression or suicidal ideation.   PAST MEDICAL HISTORY: Malignant hypertension, chronic hyponatremia, history of SVT and A-fib with RVR, osteoarthritis, gastroesophageal reflux  disease, scoliosis, as well as presumed pulmonary hypertension from echocardiogram (positive for mitral regurg as well as mild to moderate tricuspid regurg).   PAST SURGICAL HISTORY: Breast biopsy, partial hysterectomy and bilateral total knee replacements.   FAMILY HISTORY: Hypertension.   SOCIAL HISTORY: The patient lives alone. She takes care of all of her activities of daily living. She is a nonsmoker and denies alcohol or drug use.   MEDICATIONS: 1.  Acetaminophen 325 mg 2 tabs every 4 hours as needed for fever.  2.  Clonidine 0.3 mg per 24 hour transdermal film extended release 1 patch placed weekly.  3.  Isosorbide mononitrate 120 mg extended release 1 tablet p.o. daily.  4.  Simvastatin 40 mg 1 tab p.o. at bedtime.  5.  Labetalol 300 mg 1 tablet p.o. b.i.d.  6.  Torsemide 1 tab p.o. daily.  7.  Hematogen Forte 1 capsule p.o. daily.  8.  Docusate sodium 100 mg 1 tab p.o. daily.  9.  Polyethylene glycol 3350 oral powder reconstitution 17 grams mixed with 8 ounces of water daily.  10.  Pantoprazole 40 mg delayed release 1 tab p.o. daily.  11.  Hydralazine 100 mg 1 tab p.o. t.i.d.   ALLERGIES: CODEINE, AMLODIPINE, SULFA DRUGS, PENICILLIN, HYDROCHLOROTHIAZIDE AND CIPROFLOXACIN.   PERTINENT LABORATORY RESULTS AND RADIOGRAPHIC FINDINGS: Serum glucose 105. BNP 6100. BUN 50, creatinine 2.58, sodium 137, potassium 3.7, chloride 101, CO2 26, calcium 8.8. Troponin 0.45. White blood cell count 4.8, hemoglobin 9.3, hematocrit 29.3. Urinalysis is negative for infection.   A chest x-ray shows stable cardiomegaly with mild interstitial prominence suggesting pulmonary edema. There is a patchy right mid lung zone opacity that could reflect atelectasis or possible confluent edema.   PHYSICAL EXAMINATION: VITAL SIGNS: Temperature is 97.6, pulse  125, respirations 18, blood pressure 182/109 and pulse ox 97% on room air.  GENERAL: The patient is alert and oriented x3, in no apparent distress.  HEENT:  Normocephalic, atraumatic. Pupils equal, round, and reactive to light and accommodation. Extraocular movements are intact. Mucous membranes are moist.  NECK: Trachea is midline. No adenopathy.  CHEST: Symmetric and atraumatic.  HEART: Tachycardic with normal S1, S2. No rubs, clicks, or murmurs appreciated.  LUNGS: Clear to auscultation bilaterally. Normal effort and excursion.  ABDOMEN: Positive bowel sounds. Soft, nontender, nondistended. No hepatosplenomegaly.  GENITOURINARY: Deferred.  MUSCULOSKELETAL: The patient moves all 4 extremities equally. Strength is 5 out of 5 in upper and lower extremities bilaterally. The patient has multiple nodules of her DIPs, MCPs and PIPs as well as some nodules on her arms and legs. This is mostly symmetric. SKIN: No rashes but there are some nodules, as mentioned before, that are soft and may be lipomas.  EXTREMITIES: No clubbing, cyanosis, or edema.  NEUROLOGIC: Cranial nerves II through XII are grossly intact.  PSYCHIATRIC: Mood is normal. Affect is congruent.   ASSESSMENT AND PLAN: This is an 81 year old female admitted for malignant hypertension and chest pain.  1.  Hypertensive urgency secondary to malignant hypertension. The patient also has some sinus tachycardia. Both have improved after getting Lopressor in the ED. Her chest pain has resolved, but it is concerning that this is the second time in as many months that the patient has had severely uncontrolled high blood pressure. The patient has an allergy to Norvasc listed, but further inquiry reveals that she had some peripheral swelling in her feet when she took the medicine prior to this admission. She notes that at that time she may not have been on a diuretic in which case her torsemide that she currently takes could offset some of the swelling that may be a side effect of calcium channel blockers. Adding Norvasc to her regimen may be helpful in controlling her blood pressure. For now I have held her  oral home dose of labetalol and started her on IV labetalol every 2 hours as needed for systolic blood pressure more than 175.  2.  Elevated troponins. This is likely due to demand ischemia secondary to prolonged tachycardia. The patient has no EKG changes. We will follow cardiac enzymes and check a thyroid stimulating hormone.  3.  Pulmonary hypertension. The patient has not had a right heart catheterization. This diagnosis is extrapolated from echo results, but this is complicated by the fact that echo does show mild to moderate tricuspid regurgitation which technically does not allow for the diagnosis of pulmonary hypertension. I recommend a definitive heart catheterization or a trial of a phosphodiesterase inhibitor that may help relieve some of her symptoms and indeed may control her blood pressure if it is directly related to pulmonary hypertension and/or her right-sided heart failure.  4.  Chronic kidney disease. Stable.  5.  Fluid balance. The patient is currently euvolemic. Her sodium is normal.  6.  Arthritis, characterized as osteoarthritis but she appears to possibly have some rheumatoid type of lesions. I have ordered an antinuclear antibody with reflex panel to hopefully clarify this as diagnosis of rheumatoid arthritis that could certainly be contributing to hypertension and treating the underlying cause may improve her blood pressure.  7.  Deep vein thrombosis prophylaxis. Heparin.  8.  Gastrointestinal prophylaxis. Pantoprazole for gastroesophageal reflux disease.   CODE STATUS: The patient is a FULL code.   TIME SPENT ON ADMISSION ORDERS AND  PATIENT CARE: Approximately 35 minutes.   ____________________________ Kelton Pillar. Sheryle Hail, MD msd:sb D: 09/02/2014 07:25:17 ET T: 09/02/2014 07:46:14 ET JOB#: [id number]  cc: Kelton Pillar. Sheryle Hail, MD, <Dictator> Kelton Pillar Zaiah Eckerson MD ELECTRONICALLY SIGNED 09/12/2014 7:32

## 2015-03-14 NOTE — Discharge Summary (Signed)
PATIENT NAME:  Laura [name], Antanasia N MR#:  [id number] DATE OF BIRTH:  [birth year]  DATE OF ADMISSION:  09/02/2014 DATE OF DISCHARGE:  09/05/2014  DISCHARGE DIAGNOSES: 1.  Acute on chronic accelerated malignant hypertension.  2.  Chest pain with elevated cardiac enzymes, resolved, consistent with non-ST-elevation myocardial infarction. 3.  Acute on chronic renal failure with a creatinine of 2.27 on day of discharge.   DISCHARGE MEDICATIONS: 1.  Docusate 100 mg p.o. daily as needed for constipation.  2.  Imdur 120 mg extended-release 1 tab p.o. daily.  3.  Torsemide 20 mg p.o. daily.  4.  Pantoprazole 40 mg delayed release daily half an hour prior to meals.  5.  Hydralazine 100 mg p.o. t.i.d.  6.  Aspirin 81 mg p.o. daily.  7.  Magnesium oxide 400 mg p.o. b.i.d.  8.  Lutein 6 mg p.o. daily.  9.  Clonidine 1 patch 0.3 mg weekly.  10.  Loratadine 10 mg p.o. daily.  11.  Simvastatin 40 mg p.o. at bedtime. 12.  Carvedilol 12.5 mg p.o. b.i.d.  13.  Nifedipine 30 mg extended-release p.o. daily.  14.  Fluticasone nasal 2 sprays once daily each nostril.   CONSULTS: Cardiology per Dr. Juliann Paresallwood and nephrology per Dr. Cherylann RatelLateef.   PROCEDURES: None.   PERTINENT LABORATORY AND STUDIES: On day of discharge, the patient had a sodium of 137, potassium 4.3, creatinine 2.27, BUN 41. GFR 22. Hemoglobin prior to discharge had been 9. LDL 78. A1c 5.1.   BRIEF HOSPITAL COURSE:  1.  Acute on chronic accelerated malignant hypertension. The patient initially came in with recurrent elevated blood pressures with complaints of chest discomfort. Her lisinopril had been held, also her verapamil had been held prior to admission. She was placed on labetalol IV and transitioned over to Coreg as she was tachycardic along with elevated blood pressures. Nephrology evaluated the patient and also started the patient on nifedipine. Since that time her systolic blood pressures have been 120s to 170s and her chest pain has resolved.  During her hospital stay, it was noted that her cardiac enzymes did rise despite no changes on her EKG. It was concerning for possible NSTEMI. She was evaluated by cardiology, per Dr. Juliann Paresallwood, who did not recommend catheterization at this time, instead just medical management. Her blood pressures improved and also her chest pain. She had no further chest pain during her hospital stay. Will focus on medical management.  2.  Other medical issues, acute on chronic renal failure. Baseline is around 1.5 for her creatinine. Her creatinine on the day of discharge was 2.2. She continues to produce good urine and has no other complaints. Plan is for her to follow up with Dr. Wynelle LinkKolluru as scheduled on October 26th.   DISPOSITION: The patient is in stable condition and will be discharged to home. She has great family support. We will plan for discharge today.   DISCHARGE FOLLOWUP AND INSTRUCTIONS: Follow up with Dr. Burnadette PopLinthavong in 10 days, follow up with Dr. Wynelle LinkKolluru on 10/26. Will need to have cardiac rehab set up with cardiology, per Dr. Glennis Brinkallwood's recommendations.   ____________________________ Marisue IvanKanhka Lyonel Morejon, MD kl:sb D: 09/05/2014 08:18:27 ET T: 09/05/2014 09:04:15 ET JOB#: [id number]  cc: Marisue IvanKanhka Shuaib Corsino, MD, <Dictator> Marisue IvanKANHKA Liesl Simons MD ELECTRONICALLY SIGNED 09/08/2014 8:30

## 2015-03-14 NOTE — Consult Note (Signed)
Chief Complaint:  Subjective/Chief Complaint Patient states to feel better today she does notice any shortness of breath and denies any chest pain.   VITAL SIGNS/ANCILLARY NOTES: **Vital Signs.:   15-Oct-15 08:52  Pulse Pulse 77  Systolic BP Systolic BP 540  Diastolic BP (mmHg) Diastolic BP (mmHg) 85  Mean BP 117  *Intake and Output.:   Daily 15-Oct-15 07:00  Grand Totals Intake:  880 Output:  0867    Net:  -1545 24 Hr.:  -1545  Oral Intake      In:  880  Urine ml     Out:  2425  Length of Stay Totals Intake:  3520 Output:  6195    Net:  -5   Brief Assessment:  GEN well developed, well nourished, no acute distress   Cardiac Regular  murmur present  --Gallop   Respiratory normal resp effort  clear BS   Gastrointestinal Normal   Gastrointestinal details normal Soft  Nontender   EXTR negative cyanosis/clubbing, negative edema   Lab Results: Thyroid:  14-Oct-15 03:55   Thyroid Stimulating Hormone 1.73 (0.45-4.50 (IU = International Unit)  ----------------------- Pregnant patients have  different reference  ranges for TSH:  - - - - - - - - - -  Pregnant, first trimetser:  0.36 - 2.50 uIU/mL)  General Ref:  13-Oct-15 05:00   ANA w/ Reflex to 5 Conf.Tests ========== TEST NAME ==========  ========= RESULTS =========  = REFERENCE RANGE =  ANA W/REFLEX-5 CONF.TEST  ANA w/Reflex ANA Direct                      [   Negative             ]          Negative               [facility] [id number]           [address] [place], [place] [address]           Lindon Romp, MD         [phone]   Result(s) reported on 04 Sep 2014 at 07:41AM.  Routine Chem:  13-Oct-15 05:00   Glucose, Serum  105  BUN  50  Creatinine (comp)  2.58  Sodium, Serum 137  Potassium, Serum 3.7  Chloride, Serum 101  CO2, Serum 26  Calcium (Total), Serum 8.8  Anion Gap 10  Osmolality (calc) 288  eGFR (African American)  23  eGFR (Non-African American)  19 (eGFR  values <36m/min/1.73 m2 may be an indication of chronic kidney disease (CKD). Calculated eGFR, using the MRDR Study equation, is useful in  patients with stable renal function. The eGFR calculation will not be reliable in acutely ill patients when serum creatinine is changing rapidly. It is not useful in patients on dialysis. The eGFR calculation may not be applicable to patients at the low and high extremes of body sizes, pregnant women, and vetetarians.)  Result Comment TROPONIN - RESULTS VERIFIED BY REPEAT TESTING.  - NOTIFIED JULIE [facility] ON 09/02/14  - AT [id number] ELEVATED RESULTS...qsd  - READ-BACK PROCESS PERFORMED.  Result(s) reported on 02 Sep 2014 at 05:54AM.  B-Type Natriuretic Peptide ([facility]  6100 (Result(s) reported on 02 Sep 2014 at 06:27AM.)  14-Oct-15 03:55   Hemoglobin A1c ([facility] 5.1 (The American Diabetes Association recommends that a primary goal of therapy should be <7% and that physicians should reevaluate the treatment regimen in patients with HbA1c  values consistently >8%.)  Cholesterol, Serum 137  Triglycerides, Serum 106  HDL (INHOUSE)  38  VLDL Cholesterol Calculated 21  LDL Cholesterol Calculated 78 (Result(s) reported on 03 Sep 2014 at 05:16AM.)    16:00   Glucose, Serum 97  BUN  40  Creatinine (comp)  2.30  Sodium, Serum 136  Potassium, Serum 4.5  Chloride, Serum 103  CO2, Serum 26  Calcium (Total), Serum 8.7  Anion Gap 7  Osmolality (calc) 282  eGFR (African American)  26  eGFR (Non-African American)  21 (eGFR values <21m/min/1.73 m2 may be an indication of chronic kidney disease (CKD). Calculated eGFR, using the MRDR Study equation, is useful in  patients with stable renal function. The eGFR calculation will not be reliable in acutely ill patients when serum creatinine is changing rapidly. It is not useful in patients on dialysis. The eGFR calculation may not be applicable to patients at the low and high extremes of body sizes,  pregnant women, and vetetarians.)  Cardiac:  13-Oct-15 05:00   Troponin I  0.45 (0.00-0.05 0.05 ng/mL or less: NEGATIVE  Repeat testing in 3-6 hrs  if clinically indicated. >0.05 ng/mL: POTENTIAL  MYOCARDIAL INJURY. Repeat  testing in 3-6 hrs if  clinically indicated. NOTE: An increase or decrease  of 30% or more on serial  testing suggests a  clinically important change)  CPK-MB, Serum  6.6 (Result(s) reported on 02 Sep 2014 at 06:27AM.)  CK, Total 98 (26-192 NOTE: NEW REFERENCE RANGE  12/23/2013)  Routine Hem:  13-Oct-15 05:00   Hemoglobin (CBC)  9.3  WBC (CBC) 4.8  RBC (CBC)  3.24  Hematocrit (CBC)  29.3  Platelet Count (CBC) 236 (Result(s) reported on 02 Sep 2014 at 05:30AM.)  MCV 91  MCH 28.8  MCHC  31.8  RDW  14.7   Radiology Results: XRay:    13-Oct-15 05:22, Chest Portable Single View  Chest Portable Single View   REASON FOR EXAM:    Left Side Chest pain  COMMENTS:       PROCEDURE: DXR - DXR PORTABLE CHEST SINGLE VIEW  - Sep 02 2014  5:22AM     CLINICAL DATA:  LEFT-sided chest pain beginning this morning,  shortness of breath.    EXAM:  PORTABLE CHEST - 1 VIEW    COMPARISON:  CT of the chest August 01, 2014    FINDINGS:  The cardiac silhouette appears moderately enlarged. Tortuous  calcified aorta. Mild interstitial prominence. Elevated RIGHT  diaphragm. No pleural effusions. Patchy RIGHT midlung zone airspace  opacity. No pneumothorax. Osteopenia. Scoliosis.     IMPRESSION:  Stable cardiomegaly. Mild interstitial prominence suggests pulmonary  edema. Patchy RIGHT midlung zone airspace opacity could reflect  atelectasis or possibly confluent edema.      Electronically Signed    By: CElon Alas   On: 09/02/2014 05:45       Verified By: CRicky Ala M.D.,  Cardiology:    13-Oct-15 04:53, ED ECG  Ventricular Rate 137  Atrial Rate 137  P-R Interval 112  QRS Duration 86  QT 334  QTc 504  R Axis -36  T Axis 80  ECG  interpretation   Sinus tachycardia  Left axis deviation  ST & T wave abnormality, consider lateral ischemia  Abnormal ECG  When compared with ECG of 27-Jul-2014 08:51,  Vent. rate has increased BY  62 BPM  ST now depressed in Inferior leads  ST now depressed in Anterior leads  Inverted T waves have replaced  nonspecific T wave abnormality in Lateral leads  ----------unconfirmed----------  Confirmed by OVERREAD, NOT ([id number]), editor PEARSON, BARBARA ([id number]) on 09/03/2014 12:30:08 PM  ED ECG    Assessment/Plan:  Assessment/Plan:  Assessment IMP  non-Q-wave myocardial infarction  coronary disease  labile hypertension  acute on chronic renal insufficiency  congestive heart failure diastolic dysfunction  anemia  hyperlipidemia  paroxysmal atrial fibrillation .   Plan PLAN  recommend low-dose aspirin therapy 81 mg a day  blood pressure control agree with amlodipine  statin therapy  increase activity with walking  agree with Nephrology and improved for renal sufficiency  Lasix therapy for a mild heart failure  do not recommend cardiac catheterization or invasive procedure  do not recommend high level anticoagulation for AFib  hopefully discharge soon  follow-up with Cardiology 1-2 weeks as outpatient   Electronic Signatures: Lujean Amel D (MD)  (Signed 15-Oct-15 09:42)  Authored: Chief Complaint, VITAL SIGNS/ANCILLARY NOTES, Brief Assessment, Lab Results, Radiology Results, Assessment/Plan   Last Updated: 15-Oct-15 09:42 by Yolonda Kida (MD)

## 2015-03-14 NOTE — H&P (Signed)
PATIENT NAME:  Laura [name], Bani N MR#:  [id number] DATE OF BIRTH:  03-Aug-1924  DATE OF ADMISSION:  12/29/2013  PRIMARY CARE PHYSICIAN: Dr. Burnadette PopLinthavong   REFERRING EMERGENCY ROOM PHYSICIAN:  Dr. Roselind MessierKinard    CHIEF COMPLAINT:  Intractable nausea and vomiting with lethargy and chest pain.   HISTORY OF PRESENT ILLNESS: The patient is an 81 year old female, is brought into the ER for intractable nausea and vomiting for the past few days. According to the son, she vomited approximately 3 days in the past 1 week, and then again today she had 6 to 7 episodes of vomiting.  She also had a few runs of diarrhea. The patient was complaining of chest pressure. Treated for URI with a Z-Pak in the past few days. The patient's son called EMS.  The patient was complaining of chest pressure at that time. The patient was actively vomiting while she was at triage.  Her blood pressure was at 250/150. According to the EMS, the patient's blood pressure at home was 210/110.  Though the patient tried to take her daily medications, she vomited soon after she took them. CT of the head was done which has been revealed no acute findings. The patient denies any chest pain during my examination.  The patient's sodium was found to be at 112, according to the lab work. Initial troponin is negative. The patient's son, who is a retired Teacher, early years/prepharmacist, is at bedside. The patient is very lethargic, but opening her eyes to verbal commands and again falling asleep.  She denies any chest pressure, but feeling dry. Not answering most of the questions as she is lethargic.   PAST MEDICAL HISTORY:  Hypertension, history of paroxysmal SVT, osteoarthritis, GERD, scoliosis.   PAST SURGICAL HISTORY: Breast biopsy, partial hysterectomy, bilateral knee replacement.  ALLERGIES:  AMLODIPINE, CODEINE, PENICILLIN, SULFA DRUGS.  PSYCHOSOCIAL HISTORY: Lives in assisted living facility. No history of smoking, alcohol, or illicit drug use. She is a DO NOT RESUSCITATE.  Son is the medical power of attorney.  Son's name is Brennan BaileyMark Smith.     FAMILY HISTORY: Hypertension.   REVIEW OF SYSTEMS: Unobtainable as the patient is quite lethargic.   HOME MEDICATIONS: Prilosec 20 mg once daily, Normodyne 100 mg p.o. 2 times a day, magnesium oxide 400 mg 2 times a day, lisinopril 40 mg p.o. once daily, isosorbide mononitrate 1 tablet p.o. once daily at 5 p.m., hydralazine 25 mg 4 tablets p.o. q.8 hours, Colace 100 mg p.o. once daily, aspirin 81 mg once daily, Tylenol 500 mg p.o. as needed.   PHYSICAL EXAMINATION:  VITAL SIGNS: Temperature 98.4, pulse 56, respirations 18, blood pressure 196/80, pulse oximetry is 94%.  GENERAL APPEARANCE: Not in acute distress, but lethargic.  HEENT: Normocephalic, atraumatic. Pupils are equally reacting to light and accommodation. No scleral icterus. No conjunctival injection.  No sinus tenderness. Dry mucous membranes.  NECK: Supple. No JVD. No thyromegaly. Range of motion is intact.  LUNGS: Clear to auscultation bilaterally. No accessory muscle use.  No anterior chest wall tenderness on palpation.   CARDIAC:  S1, S2 is normal. Regular rate and rhythm. No peripheral edema.  GASTROINTESTINAL: Soft. Bowel sounds are positive in all 4 quadrants. Nontender, nondistended. No hepatosplenomegaly. No masses felt.  NEUROLOGIC:  Very lethargic, but arousable, answers few questions, and falling asleep.  Spontaneously moving extremities. Reflexes are 2+.  EXTREMITIES: No edema. No cyanosis. No clubbing.  SKIN: Warm to touch. Decreased turgor. No rashes. No lesions.   LABORATORIES AND IMAGING STUDIES:  Troponin less  than 0.02. CBC normal. LFTs: Albumin is 3.2, AST 52. Chem-8:  Glucose 147, BUN 12, creatinine 0.66, sodium 112, potassium 3.4, chloride is 78. GFR greater than 60.  Serum osmolality 230, calcium 8.0.  CT head: No acute findings, atrophy and chronic ischemic white matter disease was noticed. Chest x-ray, portable, single view, has revealed  cardiomegaly, and lungs are clear. A 12-lead EKG: Sinus rhythm with occasional premature ventricular complexes, left axis deviation, normal PR and QRS interval. No acute ST-T wave changes.    ASSESSMENT AND PLAN:  4.  A 81 year old female with a DO NOT RESUSCITATE CODE STATUS brought into the Emergency Room for intractable nausea and vomiting for the past 3 to 5 days, and a few episodes of diarrhea yesterday.  2.  Malignant hypertension with labile blood pressure. We will admit her to critical care unit.  The patient is on labetalol gtt.  Resume her home medication except the angiotensin-converting-enzyme inhibitor as the patient has acute renal failure.  3.  Chest pain probably from malignant hypertension and demand ischemia. We will monitor her on telemetry, acute coronary syndrome protocol, cycle cardiac biomarkers. Cardiology consult is placed to Dr. Juliann Pares.  4.  Severe hyponatremia, probably from dehydration from intractable nausea and vomiting.  We will provide her with gentle hydration with normal saline IV fluids at 80 mL per hour.  We will get serial BMPs. Nephrology consult is placed, and call placed to Dr. Cherylann Ratel, awaiting call back. 5.  History of gastroesophageal reflux disease.  The patient will be on IV Protonix.  6.  History of osteoarthritis. Pain management as needed.  7.  Will provide gastrointestinal and deep vein thrombosis prophylaxis with Protonix and heparin subcutaneous.    The diagnosis and plan of care was discussed in detail with the patient's son, who is the medical power of attorney, at bedside. He verbalized understanding of the plan. She is DO NOT RESUSCITATE.    Total critical care time spent is 50 minutes.  Transfer the patient to Dr. Burnadette Pop in the a.m.     ____________________________ Ramonita Lab, MD ag:kp D: 12/30/2013 01:22:05 ET T: 12/30/2013 09:14:07 ET JOB#: [id number]  cc: Ramonita Lab, MD, <Dictator> Ramonita Lab MD ELECTRONICALLY SIGNED 01/16/2014  18:41

## 2015-03-14 NOTE — Consult Note (Signed)
Chief Complaint:  Subjective/Chief Complaint Continued shortness of breath with wheezing   VITAL SIGNS/ANCILLARY NOTES: **Vital Signs.:   07-Sep-15 11:56  Vital Signs Type Routine  Temperature Temperature (F) 98.1  Celsius 36.7  Temperature Source oral  Pulse Pulse 59  Respirations Respirations 20  Systolic BP Systolic BP 097  Diastolic BP (mmHg) Diastolic BP (mmHg) 68  Mean BP 103  Pulse Ox % Pulse Ox % 99  Pulse Ox Activity Level  At rest  Oxygen Delivery 2L  *Intake and Output.:   07-Sep-15 10:04  Grand Totals Intake:   Output:  500    Net:  -500 24 Hr.:  -510  Urine ml     Out:  500  Urinary Method  Void; BSC  Stool  Medium sized formed brown stool.   Brief Assessment:  GEN well developed, well nourished, no acute distress   Cardiac Regular  --Gallop   Respiratory normal resp effort   Gastrointestinal Normal   Gastrointestinal details normal Soft  Nontender  Nondistended   EXTR positive edema   Lab Results: Routine Chem:  05-Sep-15 04:05   Glucose, Serum 83  BUN  20  Creatinine (comp)  1.53  Sodium, Serum  131  Potassium, Serum 3.9  Chloride, Serum 99  CO2, Serum 23  Calcium (Total), Serum  8.1  Anion Gap 9  Osmolality (calc) 264  eGFR (African American)  35  eGFR (Non-African American)  30 (eGFR values <84m/min/1.73 m2 may be an indication of chronic kidney disease (CKD). Calculated eGFR is useful in patients with stable renal function. The eGFR calculation will not be reliable in acutely ill patients when serum creatinine is changing rapidly. It is not useful in  patients on dialysis. The eGFR calculation may not be applicable to patients at the low and high extremes of body sizes, pregnant women, and vegetarians.)  07-Sep-15 05:26   Glucose, Serum  104  BUN  24  Creatinine (comp)  1.89  Sodium, Serum  124  Potassium, Serum 4.5  Chloride, Serum  95  CO2, Serum 21  Calcium (Total), Serum 8.8  Anion Gap 8  Osmolality (calc) 254  eGFR  (African American)  27  eGFR (Non-African American)  23 (eGFR values <652mmin/1.73 m2 may be an indication of chronic kidney disease (CKD). Calculated eGFR is useful in patients with stable renal function. The eGFR calculation will not be reliable in acutely ill patients when serum creatinine is changing rapidly. It is not useful in  patients on dialysis. The eGFR calculation may not be applicable to patients at the low and high extremes of body sizes, pregnant women, and vegetarians.)   Radiology Results: XRay:    01-Sep-15 06:29, Chest Portable Single View  Chest Portable Single View   REASON FOR EXAM:    Chest pain  COMMENTS:       PROCEDURE: DXR - DXR PORTABLE CHEST SINGLE VIEW  - Jul 22 2014  6:29AM     CLINICAL DATA:  Chest pain, shortness of breath    EXAM:  PORTABLE CHEST - 1 VIEW    COMPARISON:  Prior radiograph from 01/07/2014    FINDINGS:  Cardiomegaly is stable from prior. Prominent tortuosity  intrathoracic aorta is also unchanged.  Lungs are mildly hypoinflated. There is mild diffuse pulmonary  vascular congestion without frank pulmonary edema. No pleural  effusion.No consolidative airspace disease identified. No  pneumothorax.    No acute osseous abnormality.     IMPRESSION:  Cardiomegaly with mild diffuse pulmonary vascular congestion  without  overt pulmonary edema.      Electronically Signed    By: BenjaminMcClintock M.D.    On: 07/22/2014 06:38     Verified By: Neomia Glass, M.D.,  Cardiology:    01-Sep-15 05:45, ED ECG  Ventricular Rate 80  Atrial Rate 80  P-R Interval 218  QRS Duration 90  QT 392  QTc 452  P Axis 82  R Axis -29  T Axis 62  ECG interpretation   Sinus rhythm with 1st degree A-V block  Moderate voltage criteria for LVH, may be normal variant  Borderline ECG  When compared with ECG of 31-Dec-2013 08:40,  Vent. rate has increased BY  26 BPM  ----------unconfirmed----------  Confirmed by OVERREAD, NOT ([id number]),  editor PEARSON, BARBARA ([id number]) on 07/22/2014 12:14:27 PM  ED ECG     04-Sep-15 08:30, ECG  Ventricular Rate 65  Atrial Rate 65  P-R Interval 200  QRS Duration 90  QT 442  QTc 459  P Axis 74  R Axis -13  T Axis 59  ECG interpretation   Normal sinus rhythm  Moderate voltage criteria for LVH, may be normal variant  Borderline ECG  When compared with ECG of 22-Jul-2014 05:45,  No significant change was found  Confirmed by CALLWOOD, DWAYNE ([id number]) on 07/25/2014 2:18:56 PM    Overreader: Lujean Amel  ECG   CT:    01-Sep-15 06:49, CT Chest Abdomen and Pelvis WO  CT Chest Abdomen and Pelvis WO   REASON FOR EXAM:    (1) chest pain with radiation to her back eval; (2)   chest pain with radiation to  COMMENTS:       PROCEDURE: CT  - CT CHEST ABDOMEN AND PELVIS WO  - Jul 22 2014  6:49AM     CLINICAL DATA:  Chest pain, radiating into the back.    EXAM:  CT CHEST, ABDOMEN AND PELVIS WITHOUT CONTRAST    TECHNIQUE:  Multidetector CT imaging of the chest, abdomen and pelvis was  performed following the standard protocol without IV contrast.  COMPARISON:  03/03/2006 abdominal pelvic CT.    FINDINGS:  CT CHEST FINDINGS    Ectatic ascending aorta. Scattered atherosclerotic disease of the  thoracic aorta and branch vessels. Nonspecific thyroid nodules.    Cardiomegaly. Coronary artery and aortic valvular calcifications.  Trace pericardial fluid. Small right pleural effusion. No  intrathoracic lymphadenopathy.    Central airways are patent. No pneumothorax. Narrowing of right  lower lobe bronchi (series 4, image 34). Prominent right pulmonary  artery and veins above the bronchi. Mild scarring at the lung bases.  Osteopenia.  Multilevel degenerative changes.    CT ABDOMEN AND PELVIS FINDINGS    Organ evaluation is limited in the absence of intravenous contrast.  Within this limitation, hepatic granuloma. No appreciable  abnormality of the spleen, pancreas, adrenal glands.  Gallbladder  sludge or noncalcified stones. No biliary ductal dilatation.    Bilateral renal cysts and indeterminate hyperdense lesions. No  hydroureteronephrosis. Nonspecific mild right perinephric fat  stranding.    Colonic diverticulosis. No CT evidence for colitis or  diverticulitis. Appendix not identified. No right lower quadrant  inflammation. Small bowel loops are of normal course and caliber.  Fat containing umbilical hernia.    Thin walled bladder.  Absent uterus.  No adnexal mass.    Limited vascular evaluation in the absence of intravenous contrast.  Aortic tortuosity and scattered atherosclerotic disease of the aorta  and branch vessels without aneurysmal dilatation.  Osteopenia and multilevel degenerative changes. Curvature of the  spine.     IMPRESSION:  Cardiomegaly.  Coronary artery calcifications.  Scattered atherosclerotic disease of the aorta and branch vessels  without aneurysmal dilatation. Cannot evaluate for dissection in the  absence of intravenous contrast.    Small right pleural effusion. Associated airspace consolidation;  atelectasis versus pneumonia.    Narrowing of right lower lobe bronchi, nonspecific. Recommend  short-term follow-up chest CT.    Mild right perinephric fat stranding. Correlate with urinalysis to  exclude ascending infection.    Gallbladder sludge and/or stones.  No CT evidence for cholecystitis.  Diverticulosis.  No CT evidence for diverticulitis.    Curvature and multilevel degenerative changes of the spine. No  definite acute osseous finding.      Electronically Signed    By: Carlos Levering M.D.    On: 07/22/2014 07:08         Verified By: Tommi Rumps, M.D.,   Assessment/Plan:  Assessment/Plan:  Assessment IMP  atrial fibrillation  malignant hypertension  edema Hyponatremia  renal insufficiency  wheezing .   Plan PLAN  continued telemetry  rate control for atrial fibrillation  hold off on  anticoagulation because of falls  continue blood pressure control  correct hyponatremia  follow-up renal insufficiency  recommend physical therapy  support stockings for edema  DVT prophylaxis   Electronic Signatures: Yolonda Kida (MD)  (Signed 07-Sep-15 12:48)  Authored: Chief Complaint, VITAL SIGNS/ANCILLARY NOTES, Brief Assessment, Lab Results, Radiology Results, Assessment/Plan   Last Updated: 07-Sep-15 12:48 by Yolonda Kida (MD)

## 2015-03-14 NOTE — Consult Note (Signed)
Brief Consult Note: Diagnosis: LGIB.   Patient was seen by consultant.   Consult note dictated.   Recommend to proceed with surgery or procedure.   Orders entered.   Discussed with Attending MD.   Comments: LGIB. Hemodynamically stable.   Tagged rbc scan shows possible source in R colon but not bleeding fast enough for angio.   Recs: close monitoring of h/h, hemodynamics - angiography if heavy bleeding or change in hemodynamics - otherwise colonoscopy on Friday.  - clears today and Thur - go-lytlely prep thur pm.  Electronic Signatures: Dow Adolphein, Mckyle Solanki (MD)  (Signed 11-Feb-15 14:13)  Authored: Brief Consult Note   Last Updated: 11-Feb-15 14:13 by Dow Adolphein, Linford Quintela (MD)

## 2015-03-14 NOTE — Consult Note (Signed)
Details:   - GI Note.  No active GI issues.  I will sign off.  Please call with any questions or if any gi issues arise.   Electronic Signatures: Dow Adolphein, Martinique Pizzimenti (MD)  (Signed 19-Feb-15 17:41)  Authored: Details   Last Updated: 19-Feb-15 17:41 by Dow Adolphein, Roxas Clymer (MD)

## 2015-03-14 NOTE — Consult Note (Signed)
   Present Illness 81 yo female with history of hypertension, dementia and occasional imbalance and falls. She was admitted with progressive fatigue and was noted to have markedly elevated blood pressure aas well as severe hyponatremia. She is currently on a labetalol drip for her pressure. SHe has ruled out for an mi. Serum sodium has been markedly low at 112. She also has ckd. CXR was normal. EKG was normal. She has been on lisinopril 40 mg; labetalol 100 mg bid; clonidine 0.2 mg bid as outpatient.   Physical Exam:  GEN no acute distress   NECK supple  No masses   RESP normal resp effort  clear BS   CARD Regular rate and rhythm  Murmur   Murmur Systolic   Systolic Murmur axilla   ABD denies tenderness  normal BS   LYMPH negative neck, negative axillae   EXTR negative cyanosis/clubbing, negative edema   SKIN normal to palpation   NEURO cranial nerves intact, motor/sensory function intact   PSYCH poor insight   Review of Systems:  General: No Complaints   Skin: No Complaints   ENT: No Complaints   Eyes: No Complaints   Neck: No Complaints   Respiratory: No Complaints   Cardiovascular: Dyspnea   Gastrointestinal: No Complaints   Genitourinary: No Complaints   Vascular: No Complaints   Musculoskeletal: No Complaints   Neurologic: No Complaints   Hematologic: No Complaints   Endocrine: No Complaints   Psychiatric: No Complaints   Review of Systems: All other systems were reviewed and found to be negative  poor hjistorian   Medications/Allergies Reviewed Medications/Allergies reviewed   EKG:  EKG Nml  NSR    Codeine: Itching  Sulfa drugs: Unknown  Amlodipine: Swelling  Penicillin: Unknown   Impression 81 yo female with history of hypertension and dementia who was admitted with weakness and noted to have severe hyponatremia with serum sodium of 112. She was signifiantly hypertensive on admission and has improved on iv labetalol. Has ruled out for an  mi. Would continue to treat with clonidine 0.2 bid; hydralazine 100 mg tid; lisinopril 40 mg daily and imdur. Wean iv labetalol to po as pressure allows. No evidence of chf.   Plan 1. Resume home blood pressure meds and wean iv labetalol to po as tolerated. 2. Agree with nephrology consultation 3. Repleat serum sodium slowly as you are doing.  4. Will follow   Electronic Signatures: Dalia HeadingFath, Kahli Mayon A (MD)  (Signed 09-Feb-15 15:55)  Authored: General Aspect/Present Illness, History and Physical Exam, Review of System, EKG , Allergies, Impression/Plan   Last Updated: 09-Feb-15 15:55 by Dalia HeadingFath, Jena Tegeler A (MD)

## 2015-03-15 NOTE — Op Note (Signed)
PATIENT NAME:  [name], Laura N MR#:  [id number] DATE OF BIRTH:  [birth year]  DATE OF PROCEDURE:  03/20/2012  PREOPERATIVE DIAGNOSIS: Visually significant cataract of the left eye.   POSTOPERATIVE DIAGNOSIS: Visually significant cataract of the left eye.   OPERATIVE PROCEDURE: Cataract extraction by phacoemulsification with implant of intraocular lens to left eye.   SURGEON: Galen ManilaWilliam Kathya Wilz, MD.   ANESTHESIA:  1. Managed anesthesia care.  2. Topical tetracaine drops followed by 2% Xylocaine jelly applied in the preoperative holding area.   COMPLICATIONS: None.   TECHNIQUE:  Stop and chop.   DESCRIPTION OF PROCEDURE: The patient was examined and consented in the preoperative holding area where the aforementioned topical anesthesia was applied to the left eye and then brought back to the Operating Room where the left eye was prepped and draped in the usual sterile ophthalmic fashion and a lid speculum was placed. A paracentesis was created with the side port blade and the anterior chamber was filled with viscoelastic. A near clear corneal incision was performed with the steel keratome. A continuous curvilinear capsulorrhexis was performed with a cystotome followed by the capsulorrhexis forceps. Hydrodissection and hydrodelineation were carried out with BSS on a blunt cannula. The lens was removed in a stop and chop technique and the remaining cortical material was removed with the irrigation-aspiration handpiece. The capsular bag was inflated with viscoelastic and the Tecnis ZCB00 24.5-diopter lens, serial number [id number] was placed in the capsular bag without complication. The remaining viscoelastic was removed from the eye with the irrigation-aspiration handpiece. The wounds were hydrated. The anterior chamber was flushed with Miostat and the eye was inflated to physiologic pressure. The wounds were found to be water tight. The eye was dressed with Vigamox. The patient was given protective  glasses to wear throughout the day and a shield with which to sleep tonight. The patient was also given drops with which to begin a drop regimen today and will follow-up with me in one day.   ____________________________ Jerilee FieldWilliam L. Markavious Micco, MD wlp:slb D: 03/20/2012 12:22:18 ET T: 03/20/2012 12:43:09 ET JOB#: [id number]  cc: Kairy Folsom L. Rafay Dahan, MD, <Dictator> Jerilee FieldWILLIAM L Kohl Polinsky MD ELECTRONICALLY SIGNED 03/27/2012 13:34

## 2015-03-15 NOTE — Op Note (Signed)
PATIENT NAME:  Laura [name], Merryn N MR#:  [id number] DATE OF BIRTH:  November 28, 1923  DATE OF PROCEDURE:  05/29/2012  PREOPERATIVE DIAGNOSIS: Visually significant cataract of the right eye.   POSTOPERATIVE DIAGNOSIS: Visually significant cataract of the right eye.   OPERATIVE PROCEDURE: Cataract extraction by phacoemulsification with implant of intraocular lens to right eye.   SURGEON: Galen ManilaWilliam Arlette Schaad, MD.   ANESTHESIA:  1. Managed anesthesia care.  2. Topical tetracaine drops followed by 2% Xylocaine jelly applied in the preoperative holding area.   COMPLICATIONS: None.   TECHNIQUE:  Stop-and-chop    DESCRIPTION OF PROCEDURE: The patient was examined and consented in the preoperative holding area where the aforementioned topical anesthesia was applied to the right eye and then brought back to the Operating Room where the right eye was prepped and draped in the usual sterile ophthalmic fashion and a lid speculum was placed. A paracentesis was created with the side port blade and the anterior chamber was filled with viscoelastic. A near clear corneal incision was performed with the steel keratome. A continuous curvilinear capsulorrhexis was performed with a cystotome followed by the capsulorrhexis forceps. Hydrodissection and hydrodelineation were carried out with BSS on a blunt cannula. The lens was removed in a stop-and-chop technique and the remaining cortical material was removed with the irrigation-aspiration handpiece. The capsular bag was inflated with viscoelastic and the Technus ZCB00 24.0-diopter lens, serial number [id number] was placed in the capsular bag without complication. The remaining viscoelastic was removed from the eye with the irrigation-aspiration handpiece. The wounds were hydrated. The anterior chamber was flushed with Miostat and the eye was inflated to physiologic pressure. The wounds were found to be water tight. The eye was dressed with Vigamox. The patient was given protective  glasses to wear throughout the day and a shield with which to sleep tonight. The patient was also given drops with which to begin a drop regimen today and will follow-up with me in one day.   ____________________________ Jerilee FieldWilliam L. Lanita Stammen, MD wlp:drc D: 05/29/2012 12:37:59 ET T: 05/29/2012 13:05:30 ET JOB#: [id number]  cc: Dade Rodin L. Donie Lemelin, MD, <Dictator> Jerilee FieldWILLIAM L Alto Gandolfo MD ELECTRONICALLY SIGNED 05/30/2012 9:59

## 2015-03-17 DIAGNOSIS — M150 Primary generalized (osteo)arthritis: Secondary | ICD-10-CM | POA: Diagnosis not present

## 2015-03-17 DIAGNOSIS — N189 Chronic kidney disease, unspecified: Secondary | ICD-10-CM | POA: Diagnosis not present

## 2015-03-17 DIAGNOSIS — I129 Hypertensive chronic kidney disease with stage 1 through stage 4 chronic kidney disease, or unspecified chronic kidney disease: Secondary | ICD-10-CM | POA: Diagnosis not present

## 2015-03-17 DIAGNOSIS — I509 Heart failure, unspecified: Secondary | ICD-10-CM | POA: Diagnosis not present

## 2015-03-17 DIAGNOSIS — R001 Bradycardia, unspecified: Secondary | ICD-10-CM | POA: Diagnosis not present

## 2015-03-17 DIAGNOSIS — D649 Anemia, unspecified: Secondary | ICD-10-CM | POA: Diagnosis not present

## 2015-03-24 DIAGNOSIS — M150 Primary generalized (osteo)arthritis: Secondary | ICD-10-CM | POA: Diagnosis not present

## 2015-03-24 DIAGNOSIS — D649 Anemia, unspecified: Secondary | ICD-10-CM | POA: Diagnosis not present

## 2015-03-24 DIAGNOSIS — N189 Chronic kidney disease, unspecified: Secondary | ICD-10-CM | POA: Diagnosis not present

## 2015-03-24 DIAGNOSIS — I509 Heart failure, unspecified: Secondary | ICD-10-CM | POA: Diagnosis not present

## 2015-03-24 DIAGNOSIS — R001 Bradycardia, unspecified: Secondary | ICD-10-CM | POA: Diagnosis not present

## 2015-03-24 DIAGNOSIS — I129 Hypertensive chronic kidney disease with stage 1 through stage 4 chronic kidney disease, or unspecified chronic kidney disease: Secondary | ICD-10-CM | POA: Diagnosis not present

## 2015-03-30 DIAGNOSIS — H3532 Exudative age-related macular degeneration: Secondary | ICD-10-CM | POA: Diagnosis not present

## 2015-03-31 DIAGNOSIS — I509 Heart failure, unspecified: Secondary | ICD-10-CM | POA: Diagnosis not present

## 2015-03-31 DIAGNOSIS — N189 Chronic kidney disease, unspecified: Secondary | ICD-10-CM | POA: Diagnosis not present

## 2015-03-31 DIAGNOSIS — R001 Bradycardia, unspecified: Secondary | ICD-10-CM | POA: Diagnosis not present

## 2015-03-31 DIAGNOSIS — I129 Hypertensive chronic kidney disease with stage 1 through stage 4 chronic kidney disease, or unspecified chronic kidney disease: Secondary | ICD-10-CM | POA: Diagnosis not present

## 2015-03-31 DIAGNOSIS — D649 Anemia, unspecified: Secondary | ICD-10-CM | POA: Diagnosis not present

## 2015-03-31 DIAGNOSIS — M150 Primary generalized (osteo)arthritis: Secondary | ICD-10-CM | POA: Diagnosis not present

## 2015-04-07 DIAGNOSIS — D649 Anemia, unspecified: Secondary | ICD-10-CM | POA: Diagnosis not present

## 2015-04-07 DIAGNOSIS — I509 Heart failure, unspecified: Secondary | ICD-10-CM | POA: Diagnosis not present

## 2015-04-07 DIAGNOSIS — M150 Primary generalized (osteo)arthritis: Secondary | ICD-10-CM | POA: Diagnosis not present

## 2015-04-07 DIAGNOSIS — N189 Chronic kidney disease, unspecified: Secondary | ICD-10-CM | POA: Diagnosis not present

## 2015-04-07 DIAGNOSIS — I129 Hypertensive chronic kidney disease with stage 1 through stage 4 chronic kidney disease, or unspecified chronic kidney disease: Secondary | ICD-10-CM | POA: Diagnosis not present

## 2015-04-07 DIAGNOSIS — R001 Bradycardia, unspecified: Secondary | ICD-10-CM | POA: Diagnosis not present

## 2015-04-10 DIAGNOSIS — N189 Chronic kidney disease, unspecified: Secondary | ICD-10-CM | POA: Diagnosis not present

## 2015-04-10 DIAGNOSIS — M150 Primary generalized (osteo)arthritis: Secondary | ICD-10-CM | POA: Diagnosis not present

## 2015-04-10 DIAGNOSIS — D649 Anemia, unspecified: Secondary | ICD-10-CM | POA: Diagnosis not present

## 2015-04-10 DIAGNOSIS — R001 Bradycardia, unspecified: Secondary | ICD-10-CM | POA: Diagnosis not present

## 2015-04-10 DIAGNOSIS — I129 Hypertensive chronic kidney disease with stage 1 through stage 4 chronic kidney disease, or unspecified chronic kidney disease: Secondary | ICD-10-CM | POA: Diagnosis not present

## 2015-04-10 DIAGNOSIS — I509 Heart failure, unspecified: Secondary | ICD-10-CM | POA: Diagnosis not present

## 2015-04-13 DIAGNOSIS — G629 Polyneuropathy, unspecified: Secondary | ICD-10-CM | POA: Diagnosis not present

## 2015-04-13 DIAGNOSIS — I129 Hypertensive chronic kidney disease with stage 1 through stage 4 chronic kidney disease, or unspecified chronic kidney disease: Secondary | ICD-10-CM | POA: Diagnosis not present

## 2015-04-13 DIAGNOSIS — N189 Chronic kidney disease, unspecified: Secondary | ICD-10-CM | POA: Diagnosis not present

## 2015-04-13 DIAGNOSIS — R001 Bradycardia, unspecified: Secondary | ICD-10-CM | POA: Diagnosis not present

## 2015-04-13 DIAGNOSIS — E871 Hypo-osmolality and hyponatremia: Secondary | ICD-10-CM | POA: Diagnosis not present

## 2015-04-13 DIAGNOSIS — I509 Heart failure, unspecified: Secondary | ICD-10-CM | POA: Diagnosis not present

## 2015-04-13 DIAGNOSIS — M159 Polyosteoarthritis, unspecified: Secondary | ICD-10-CM | POA: Diagnosis not present

## 2015-04-13 DIAGNOSIS — I10 Essential (primary) hypertension: Secondary | ICD-10-CM | POA: Diagnosis not present

## 2015-04-14 DIAGNOSIS — R001 Bradycardia, unspecified: Secondary | ICD-10-CM | POA: Diagnosis not present

## 2015-04-14 DIAGNOSIS — M150 Primary generalized (osteo)arthritis: Secondary | ICD-10-CM | POA: Diagnosis not present

## 2015-04-14 DIAGNOSIS — D649 Anemia, unspecified: Secondary | ICD-10-CM | POA: Diagnosis not present

## 2015-04-14 DIAGNOSIS — N189 Chronic kidney disease, unspecified: Secondary | ICD-10-CM | POA: Diagnosis not present

## 2015-04-14 DIAGNOSIS — I509 Heart failure, unspecified: Secondary | ICD-10-CM | POA: Diagnosis not present

## 2015-04-14 DIAGNOSIS — I129 Hypertensive chronic kidney disease with stage 1 through stage 4 chronic kidney disease, or unspecified chronic kidney disease: Secondary | ICD-10-CM | POA: Diagnosis not present

## 2015-04-22 DIAGNOSIS — N189 Chronic kidney disease, unspecified: Secondary | ICD-10-CM | POA: Diagnosis not present

## 2015-04-22 DIAGNOSIS — D649 Anemia, unspecified: Secondary | ICD-10-CM | POA: Diagnosis not present

## 2015-04-22 DIAGNOSIS — M150 Primary generalized (osteo)arthritis: Secondary | ICD-10-CM | POA: Diagnosis not present

## 2015-04-22 DIAGNOSIS — I129 Hypertensive chronic kidney disease with stage 1 through stage 4 chronic kidney disease, or unspecified chronic kidney disease: Secondary | ICD-10-CM | POA: Diagnosis not present

## 2015-04-22 DIAGNOSIS — I509 Heart failure, unspecified: Secondary | ICD-10-CM | POA: Diagnosis not present

## 2015-04-22 DIAGNOSIS — R001 Bradycardia, unspecified: Secondary | ICD-10-CM | POA: Diagnosis not present

## 2015-04-27 DIAGNOSIS — H3531 Nonexudative age-related macular degeneration: Secondary | ICD-10-CM | POA: Diagnosis not present

## 2015-04-28 DIAGNOSIS — N189 Chronic kidney disease, unspecified: Secondary | ICD-10-CM | POA: Diagnosis not present

## 2015-04-28 DIAGNOSIS — R001 Bradycardia, unspecified: Secondary | ICD-10-CM | POA: Diagnosis not present

## 2015-04-28 DIAGNOSIS — D649 Anemia, unspecified: Secondary | ICD-10-CM | POA: Diagnosis not present

## 2015-04-28 DIAGNOSIS — M150 Primary generalized (osteo)arthritis: Secondary | ICD-10-CM | POA: Diagnosis not present

## 2015-04-28 DIAGNOSIS — I129 Hypertensive chronic kidney disease with stage 1 through stage 4 chronic kidney disease, or unspecified chronic kidney disease: Secondary | ICD-10-CM | POA: Diagnosis not present

## 2015-04-28 DIAGNOSIS — I509 Heart failure, unspecified: Secondary | ICD-10-CM | POA: Diagnosis not present

## 2015-05-27 DIAGNOSIS — N2581 Secondary hyperparathyroidism of renal origin: Secondary | ICD-10-CM | POA: Diagnosis not present

## 2015-05-27 DIAGNOSIS — N179 Acute kidney failure, unspecified: Secondary | ICD-10-CM | POA: Diagnosis not present

## 2015-05-27 DIAGNOSIS — N183 Chronic kidney disease, stage 3 (moderate): Secondary | ICD-10-CM | POA: Diagnosis not present

## 2015-05-27 DIAGNOSIS — E871 Hypo-osmolality and hyponatremia: Secondary | ICD-10-CM | POA: Diagnosis not present

## 2015-05-27 DIAGNOSIS — I129 Hypertensive chronic kidney disease with stage 1 through stage 4 chronic kidney disease, or unspecified chronic kidney disease: Secondary | ICD-10-CM | POA: Diagnosis not present

## 2015-05-27 DIAGNOSIS — D631 Anemia in chronic kidney disease: Secondary | ICD-10-CM | POA: Diagnosis not present

## 2015-06-01 DIAGNOSIS — N184 Chronic kidney disease, stage 4 (severe): Secondary | ICD-10-CM | POA: Diagnosis not present

## 2015-06-01 DIAGNOSIS — N2581 Secondary hyperparathyroidism of renal origin: Secondary | ICD-10-CM | POA: Diagnosis not present

## 2015-06-01 DIAGNOSIS — E871 Hypo-osmolality and hyponatremia: Secondary | ICD-10-CM | POA: Diagnosis not present

## 2015-06-01 DIAGNOSIS — D631 Anemia in chronic kidney disease: Secondary | ICD-10-CM | POA: Diagnosis not present

## 2015-06-22 DIAGNOSIS — H3531 Nonexudative age-related macular degeneration: Secondary | ICD-10-CM | POA: Diagnosis not present

## 2015-06-29 DIAGNOSIS — E780 Pure hypercholesterolemia: Secondary | ICD-10-CM | POA: Diagnosis not present

## 2015-06-29 DIAGNOSIS — I10 Essential (primary) hypertension: Secondary | ICD-10-CM | POA: Diagnosis not present

## 2015-06-29 DIAGNOSIS — N183 Chronic kidney disease, stage 3 (moderate): Secondary | ICD-10-CM | POA: Diagnosis not present

## 2015-06-29 DIAGNOSIS — E871 Hypo-osmolality and hyponatremia: Secondary | ICD-10-CM | POA: Diagnosis not present

## 2015-07-13 DIAGNOSIS — I10 Essential (primary) hypertension: Secondary | ICD-10-CM | POA: Diagnosis not present

## 2015-07-13 DIAGNOSIS — E871 Hypo-osmolality and hyponatremia: Secondary | ICD-10-CM | POA: Diagnosis not present

## 2015-07-13 DIAGNOSIS — N183 Chronic kidney disease, stage 3 (moderate): Secondary | ICD-10-CM | POA: Diagnosis not present

## 2015-07-13 DIAGNOSIS — E780 Pure hypercholesterolemia: Secondary | ICD-10-CM | POA: Diagnosis not present

## 2015-08-31 DIAGNOSIS — N184 Chronic kidney disease, stage 4 (severe): Secondary | ICD-10-CM | POA: Diagnosis not present

## 2015-08-31 DIAGNOSIS — I10 Essential (primary) hypertension: Secondary | ICD-10-CM | POA: Diagnosis not present

## 2015-08-31 DIAGNOSIS — D631 Anemia in chronic kidney disease: Secondary | ICD-10-CM | POA: Diagnosis not present

## 2015-08-31 DIAGNOSIS — E871 Hypo-osmolality and hyponatremia: Secondary | ICD-10-CM | POA: Diagnosis not present

## 2015-08-31 DIAGNOSIS — N2581 Secondary hyperparathyroidism of renal origin: Secondary | ICD-10-CM | POA: Diagnosis not present

## 2015-09-15 DIAGNOSIS — Z23 Encounter for immunization: Secondary | ICD-10-CM | POA: Diagnosis not present

## 2015-10-05 DIAGNOSIS — H353112 Nonexudative age-related macular degeneration, right eye, intermediate dry stage: Secondary | ICD-10-CM | POA: Diagnosis not present

## 2015-10-19 DIAGNOSIS — I10 Essential (primary) hypertension: Secondary | ICD-10-CM | POA: Diagnosis not present

## 2015-10-19 DIAGNOSIS — K219 Gastro-esophageal reflux disease without esophagitis: Secondary | ICD-10-CM | POA: Diagnosis not present

## 2015-10-19 DIAGNOSIS — M159 Polyosteoarthritis, unspecified: Secondary | ICD-10-CM | POA: Diagnosis not present

## 2015-10-19 DIAGNOSIS — E784 Other hyperlipidemia: Secondary | ICD-10-CM | POA: Diagnosis not present

## 2015-10-19 DIAGNOSIS — I209 Angina pectoris, unspecified: Secondary | ICD-10-CM | POA: Diagnosis not present

## 2015-10-19 DIAGNOSIS — E871 Hypo-osmolality and hyponatremia: Secondary | ICD-10-CM | POA: Diagnosis not present

## 2015-10-19 DIAGNOSIS — R001 Bradycardia, unspecified: Secondary | ICD-10-CM | POA: Diagnosis not present

## 2015-10-19 DIAGNOSIS — D649 Anemia, unspecified: Secondary | ICD-10-CM | POA: Diagnosis not present

## 2015-10-28 DIAGNOSIS — H903 Sensorineural hearing loss, bilateral: Secondary | ICD-10-CM | POA: Diagnosis not present

## 2015-10-28 DIAGNOSIS — H6123 Impacted cerumen, bilateral: Secondary | ICD-10-CM | POA: Diagnosis not present

## 2015-11-24 DIAGNOSIS — E7800 Pure hypercholesterolemia, unspecified: Secondary | ICD-10-CM | POA: Diagnosis not present

## 2015-11-24 DIAGNOSIS — N183 Chronic kidney disease, stage 3 (moderate): Secondary | ICD-10-CM | POA: Diagnosis not present

## 2015-11-24 DIAGNOSIS — E871 Hypo-osmolality and hyponatremia: Secondary | ICD-10-CM | POA: Diagnosis not present

## 2015-11-30 IMAGING — CR DG CHEST 1V PORT
1 series · 1 of 1 positions shown · non-contrast
Comparison: Prior radiograph from 01/07/2014

CLINICAL DATA: Chest pain, shortness of breath

EXAM:
PORTABLE CHEST - 1 VIEW

[ap]
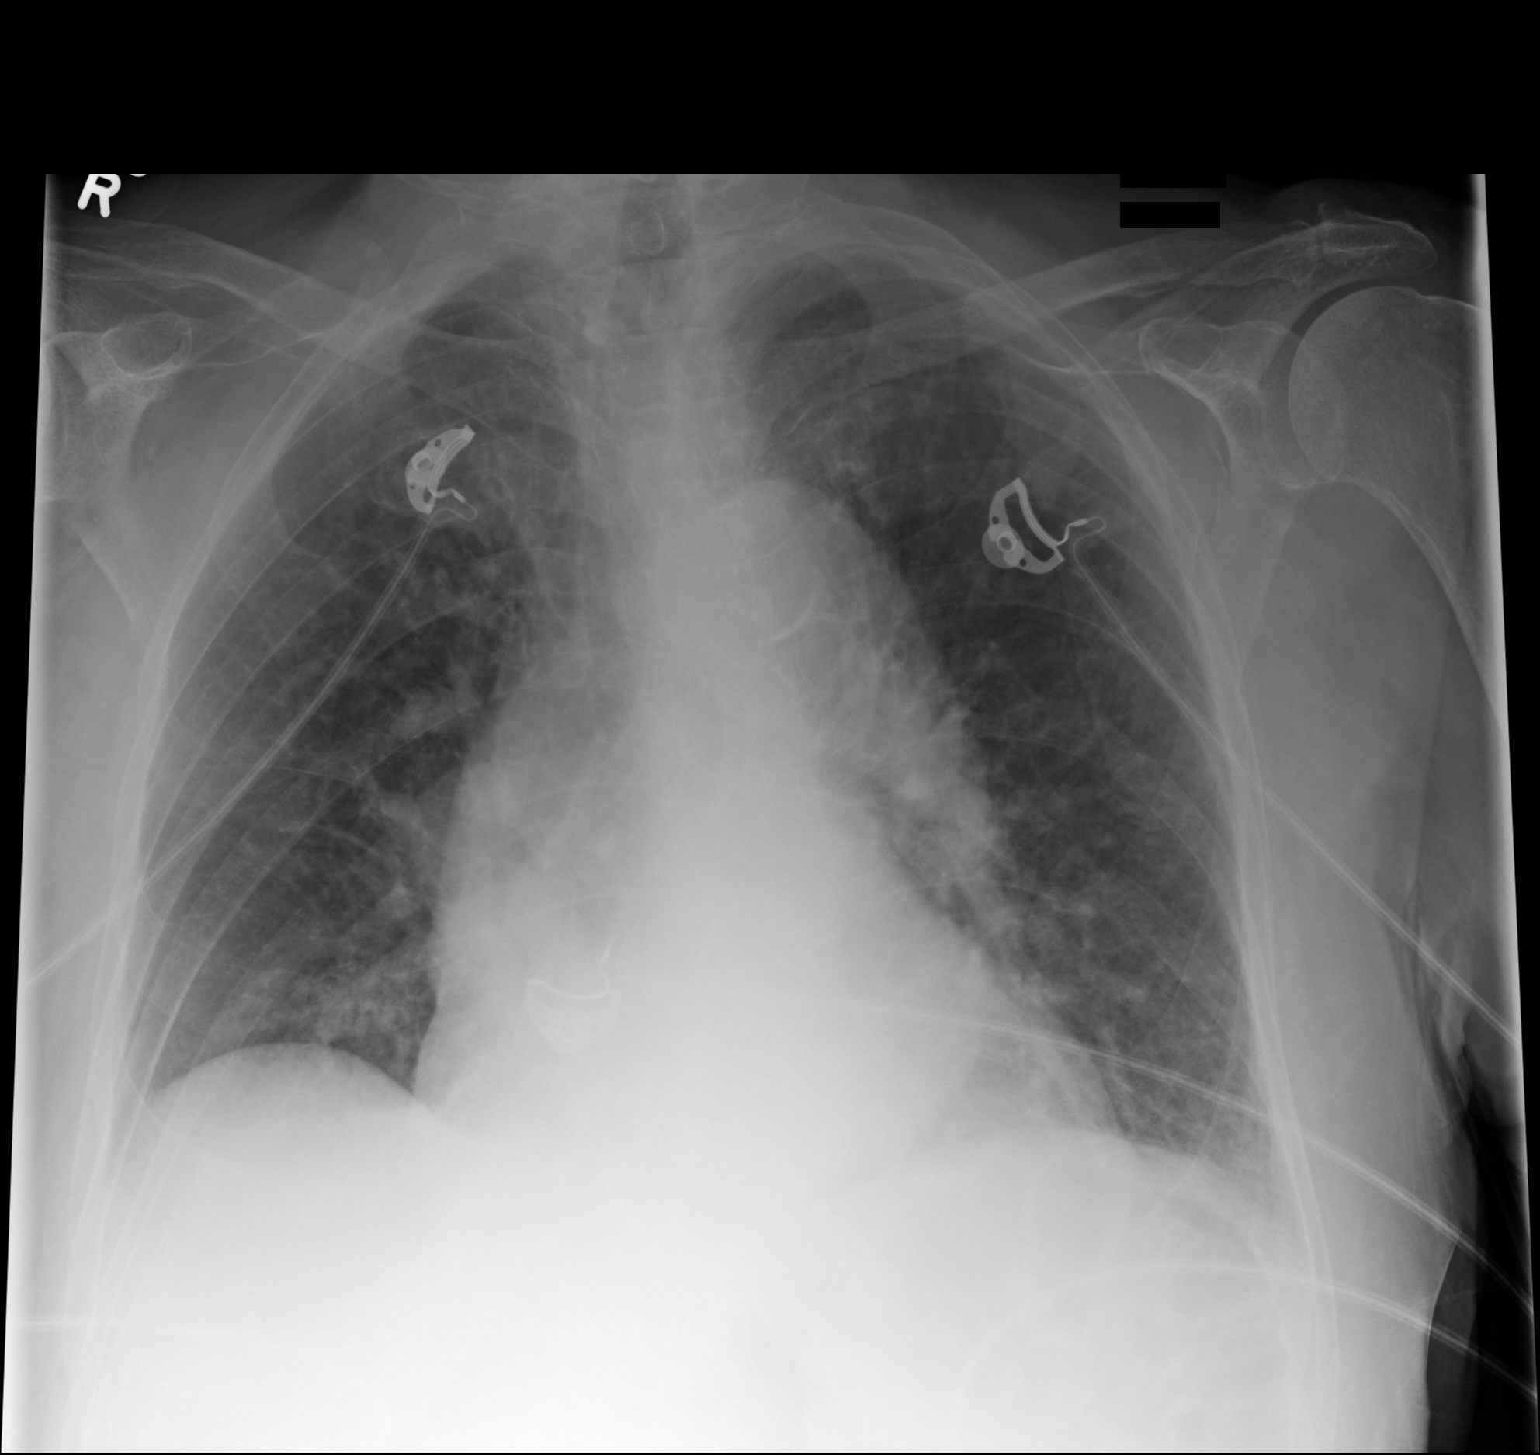

[1 of 1 positions shown; findings below may reference images not displayed]

FINDINGS: Cardiomegaly is stable from prior. Prominent tortuosity
intrathoracic aorta is also unchanged.

Lungs are mildly hypoinflated. There is mild diffuse pulmonary
vascular congestion without frank pulmonary edema. No pleural
effusion. No consolidative airspace disease identified. No
pneumothorax.

No acute osseous abnormality.
IMPRESSION: Cardiomegaly with mild diffuse pulmonary vascular congestion without
overt pulmonary edema.

## 2015-11-30 IMAGING — CT CT CHEST-ABD-PELV W/O CM
2 of 4 series · 14 of 46 positions shown, 16 images · non-contrast
Comparison: 03/03/2006 abdominal pelvic CT.

CLINICAL DATA: Chest pain, radiating into the back.

EXAM:
CT CHEST, ABDOMEN AND PELVIS WITHOUT CONTRAST
TECHNIQUE: Multidetector CT imaging of the chest, abdomen and pelvis was
performed following the standard protocol without IV contrast.

[Series 2: cap without · axial · non-contrast · 0.68mm/px · z∈[-1096,-570]mm · 11 of 117 slices shown, 13 images]
[im 6/117  soft-tissue]
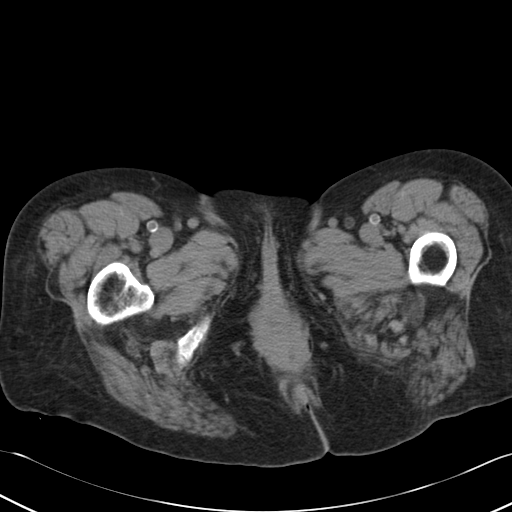
[im 6/117  bone]
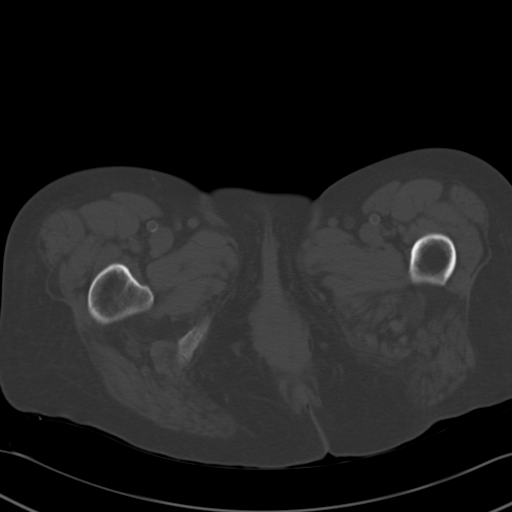
[im 18/117  soft-tissue]
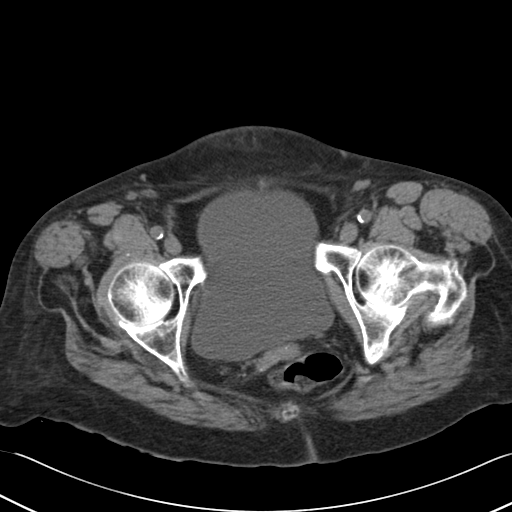
[im 30/117  soft-tissue]
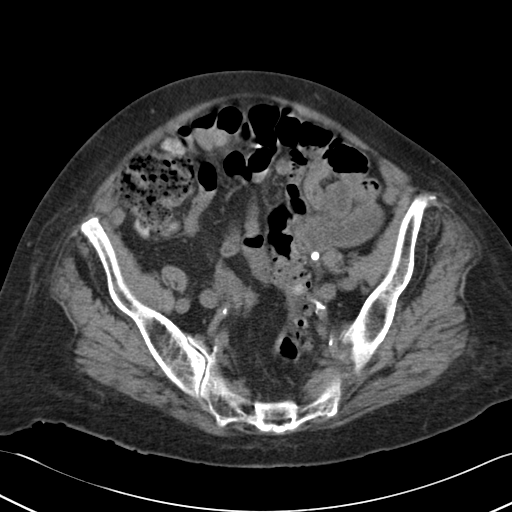
[im 41/117  soft-tissue]
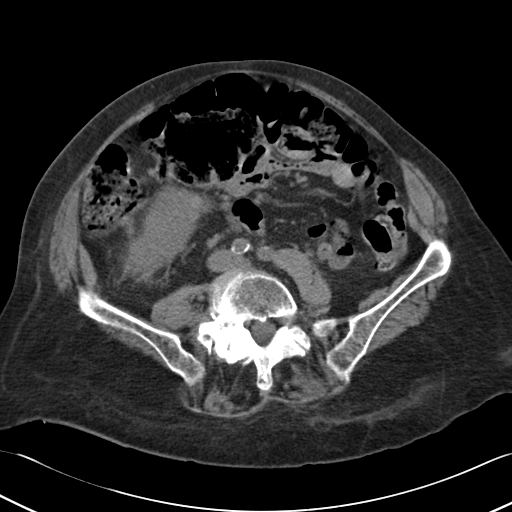
[im 47/117  soft-tissue]
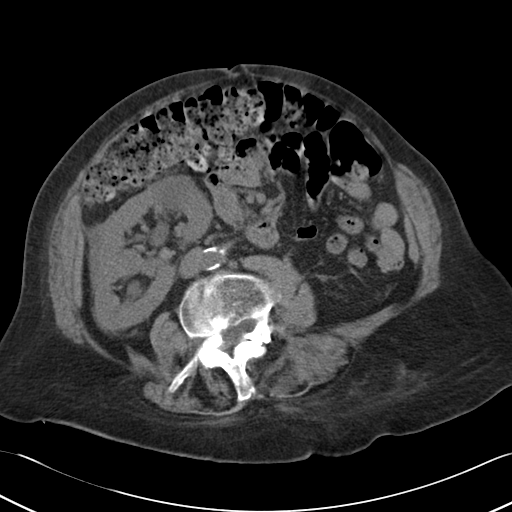
[im 59/117  soft-tissue]
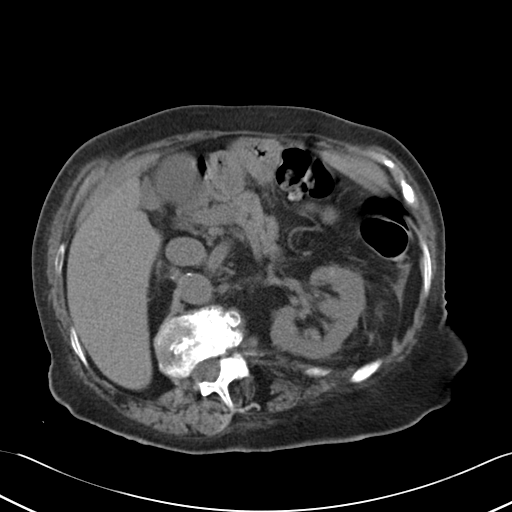
[im 70/117  soft-tissue]
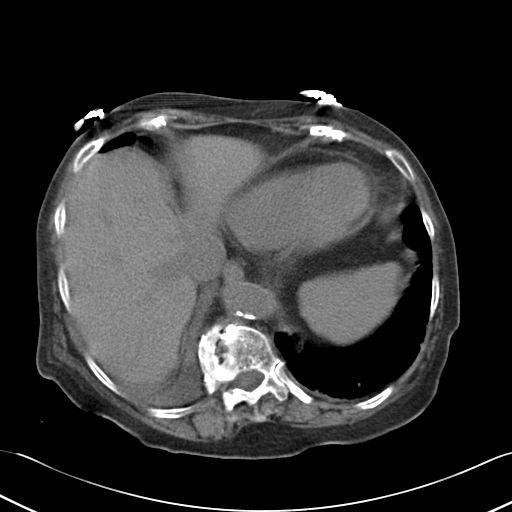
[im 76/117  soft-tissue]
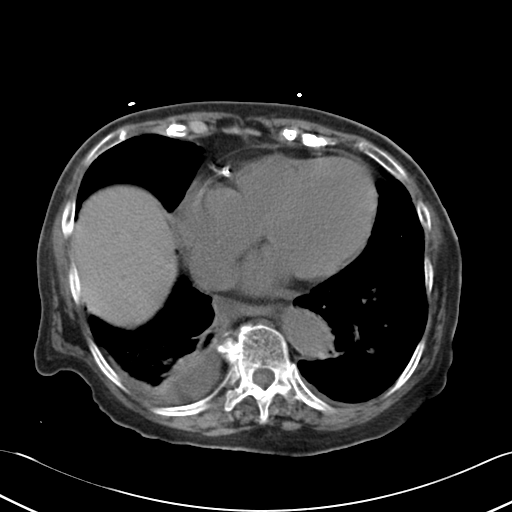
[im 88/117  soft-tissue]
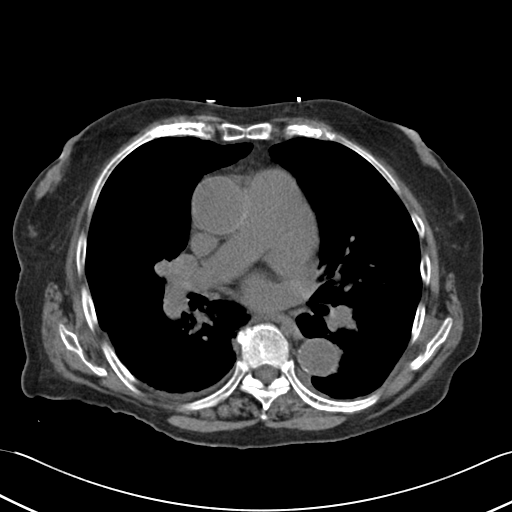
[im 88/117  bone]
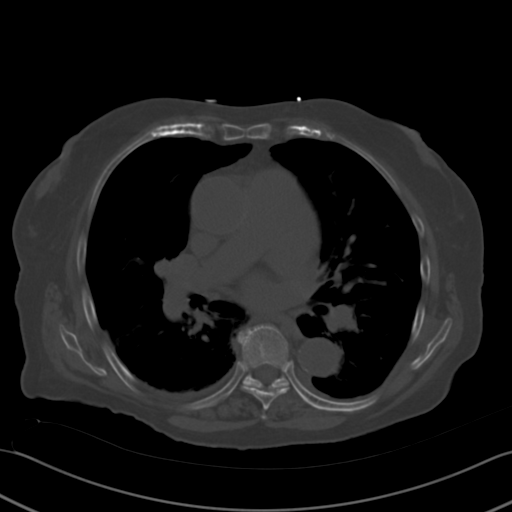
[im 99/117  soft-tissue]
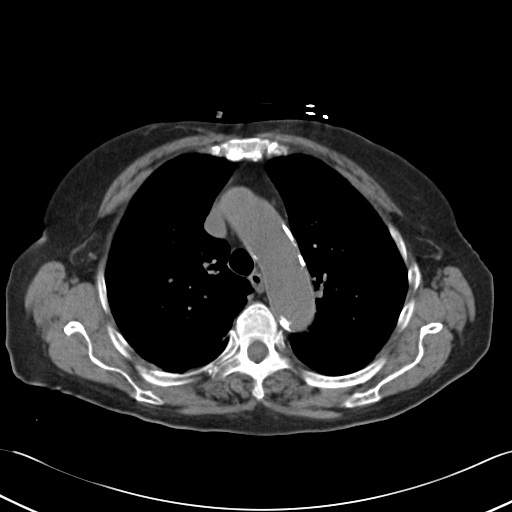
[im 111/117  soft-tissue]
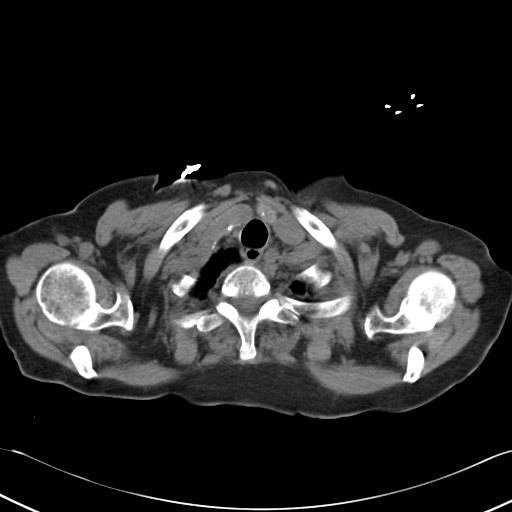

[Series 5: cor cap wo · coronal · 0.72mm/px · 3 of 176 slices shown]
[im 59/176  soft-tissue]
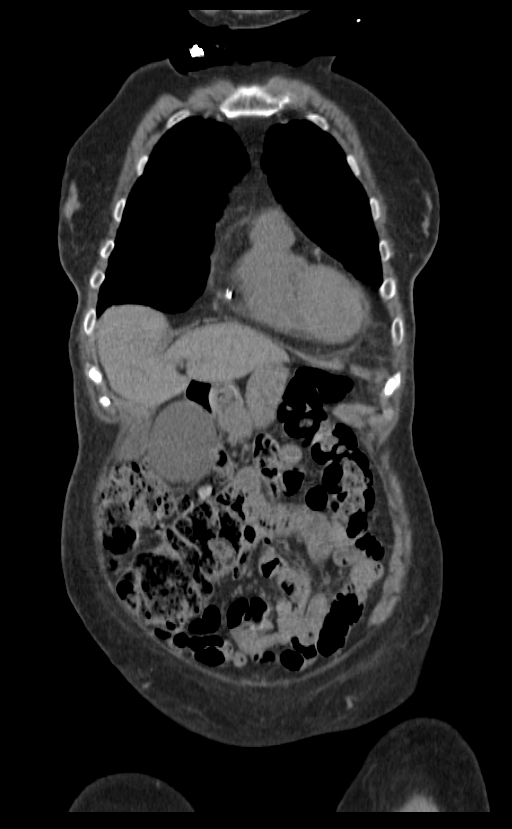
[im 78/176  soft-tissue]
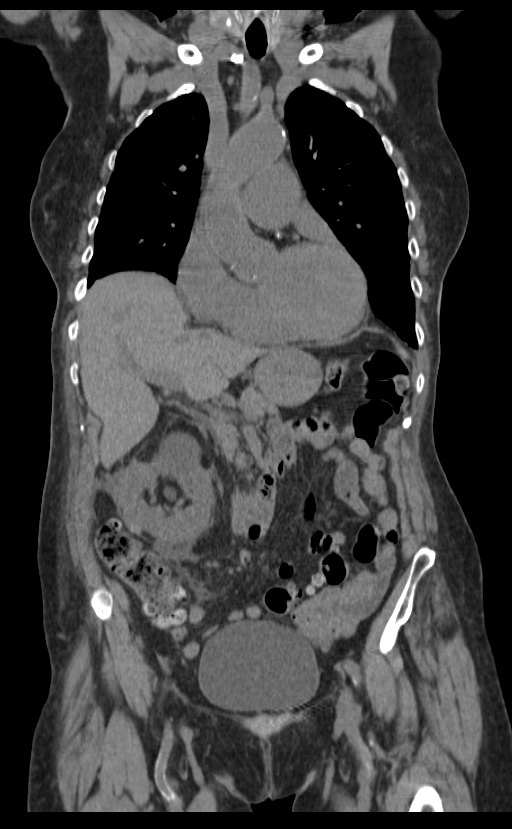
[im 98/176  soft-tissue]
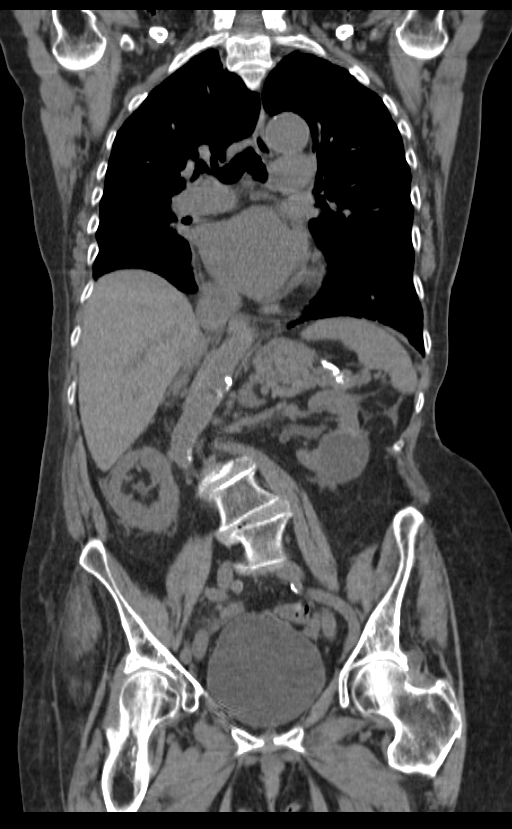

[14 of 46 positions shown; findings below may reference images not displayed]

FINDINGS: CT CHEST FINDINGS

Ectatic ascending aorta. Scattered atherosclerotic disease of the
thoracic aorta and branch vessels. Nonspecific thyroid nodules.

Cardiomegaly. Coronary artery and aortic valvular calcifications.
Trace pericardial fluid. Small right pleural effusion. No
intrathoracic lymphadenopathy.

Central airways are patent. No pneumothorax. Narrowing of right
lower lobe bronchi (series 4, image 34). Prominent right pulmonary
artery and veins above the bronchi. Mild scarring at the lung bases.

Osteopenia.  Multilevel degenerative changes.

CT ABDOMEN AND PELVIS FINDINGS

Organ evaluation is limited in the absence of intravenous contrast.
Within this limitation, hepatic granuloma. No appreciable
abnormality of the spleen, pancreas, adrenal glands. Gallbladder
sludge or noncalcified stones. No biliary ductal dilatation.

Bilateral renal cysts and indeterminate hyperdense lesions. No
hydroureteronephrosis. Nonspecific mild right perinephric fat
stranding.

Colonic diverticulosis. No CT evidence for colitis or
diverticulitis. Appendix not identified. No right lower quadrant
inflammation. Small bowel loops are of normal course and caliber.
Fat containing umbilical hernia.

Thin walled bladder.  Absent uterus.  No adnexal mass.

Limited vascular evaluation in the absence of intravenous contrast.
Aortic tortuosity and scattered atherosclerotic disease of the aorta
and branch vessels without aneurysmal dilatation.

Osteopenia and multilevel degenerative changes. Curvature of the
spine.
IMPRESSION: Cardiomegaly.  Coronary artery calcifications.

Scattered atherosclerotic disease of the aorta and branch vessels
without aneurysmal dilatation. Cannot evaluate for dissection in the
absence of intravenous contrast.

Small right pleural effusion. Associated airspace consolidation;
atelectasis versus pneumonia.

Narrowing of right lower lobe bronchi, nonspecific. Recommend
short-term follow-up chest CT.

Mild right perinephric fat stranding. Correlate with urinalysis to
exclude ascending infection.

Gallbladder sludge and/or stones.  No CT evidence for cholecystitis.

Diverticulosis.  No CT evidence for diverticulitis.

Curvature and multilevel degenerative changes of the spine. No
definite acute osseous finding.

## 2015-12-07 DIAGNOSIS — N183 Chronic kidney disease, stage 3 (moderate): Secondary | ICD-10-CM | POA: Diagnosis not present

## 2015-12-07 DIAGNOSIS — I10 Essential (primary) hypertension: Secondary | ICD-10-CM | POA: Diagnosis not present

## 2015-12-07 DIAGNOSIS — E7800 Pure hypercholesterolemia, unspecified: Secondary | ICD-10-CM | POA: Diagnosis not present

## 2015-12-21 DIAGNOSIS — R809 Proteinuria, unspecified: Secondary | ICD-10-CM | POA: Diagnosis not present

## 2015-12-21 DIAGNOSIS — I129 Hypertensive chronic kidney disease with stage 1 through stage 4 chronic kidney disease, or unspecified chronic kidney disease: Secondary | ICD-10-CM | POA: Diagnosis not present

## 2015-12-21 DIAGNOSIS — N2581 Secondary hyperparathyroidism of renal origin: Secondary | ICD-10-CM | POA: Diagnosis not present

## 2015-12-21 DIAGNOSIS — N184 Chronic kidney disease, stage 4 (severe): Secondary | ICD-10-CM | POA: Diagnosis not present

## 2015-12-21 DIAGNOSIS — E871 Hypo-osmolality and hyponatremia: Secondary | ICD-10-CM | POA: Diagnosis not present

## 2016-03-21 DIAGNOSIS — H353112 Nonexudative age-related macular degeneration, right eye, intermediate dry stage: Secondary | ICD-10-CM | POA: Diagnosis not present

## 2016-03-31 DIAGNOSIS — E7800 Pure hypercholesterolemia, unspecified: Secondary | ICD-10-CM | POA: Diagnosis not present

## 2016-03-31 DIAGNOSIS — I10 Essential (primary) hypertension: Secondary | ICD-10-CM | POA: Diagnosis not present

## 2016-03-31 DIAGNOSIS — N183 Chronic kidney disease, stage 3 (moderate): Secondary | ICD-10-CM | POA: Diagnosis not present

## 2016-04-04 DIAGNOSIS — I10 Essential (primary) hypertension: Secondary | ICD-10-CM | POA: Diagnosis not present

## 2016-04-04 DIAGNOSIS — E7800 Pure hypercholesterolemia, unspecified: Secondary | ICD-10-CM | POA: Diagnosis not present

## 2016-04-04 DIAGNOSIS — N183 Chronic kidney disease, stage 3 (moderate): Secondary | ICD-10-CM | POA: Diagnosis not present

## 2016-04-11 DIAGNOSIS — E871 Hypo-osmolality and hyponatremia: Secondary | ICD-10-CM | POA: Diagnosis not present

## 2016-04-11 DIAGNOSIS — R809 Proteinuria, unspecified: Secondary | ICD-10-CM | POA: Diagnosis not present

## 2016-04-11 DIAGNOSIS — N184 Chronic kidney disease, stage 4 (severe): Secondary | ICD-10-CM | POA: Diagnosis not present

## 2016-04-11 DIAGNOSIS — I129 Hypertensive chronic kidney disease with stage 1 through stage 4 chronic kidney disease, or unspecified chronic kidney disease: Secondary | ICD-10-CM | POA: Diagnosis not present

## 2016-04-25 DIAGNOSIS — N2581 Secondary hyperparathyroidism of renal origin: Secondary | ICD-10-CM | POA: Diagnosis not present

## 2016-04-25 DIAGNOSIS — N179 Acute kidney failure, unspecified: Secondary | ICD-10-CM | POA: Diagnosis not present

## 2016-04-25 DIAGNOSIS — D631 Anemia in chronic kidney disease: Secondary | ICD-10-CM | POA: Diagnosis not present

## 2016-04-25 DIAGNOSIS — I129 Hypertensive chronic kidney disease with stage 1 through stage 4 chronic kidney disease, or unspecified chronic kidney disease: Secondary | ICD-10-CM | POA: Diagnosis not present

## 2016-04-25 DIAGNOSIS — E781 Pure hyperglyceridemia: Secondary | ICD-10-CM | POA: Diagnosis not present

## 2016-04-25 DIAGNOSIS — N183 Chronic kidney disease, stage 3 (moderate): Secondary | ICD-10-CM | POA: Diagnosis not present

## 2016-05-16 DIAGNOSIS — M159 Polyosteoarthritis, unspecified: Secondary | ICD-10-CM | POA: Diagnosis not present

## 2016-05-16 DIAGNOSIS — K219 Gastro-esophageal reflux disease without esophagitis: Secondary | ICD-10-CM | POA: Diagnosis not present

## 2016-05-16 DIAGNOSIS — I10 Essential (primary) hypertension: Secondary | ICD-10-CM | POA: Diagnosis not present

## 2016-05-16 DIAGNOSIS — R001 Bradycardia, unspecified: Secondary | ICD-10-CM | POA: Diagnosis not present

## 2016-05-16 DIAGNOSIS — E871 Hypo-osmolality and hyponatremia: Secondary | ICD-10-CM | POA: Diagnosis not present

## 2016-05-16 DIAGNOSIS — I209 Angina pectoris, unspecified: Secondary | ICD-10-CM | POA: Diagnosis not present

## 2016-05-16 DIAGNOSIS — D649 Anemia, unspecified: Secondary | ICD-10-CM | POA: Diagnosis not present

## 2016-05-16 DIAGNOSIS — E784 Other hyperlipidemia: Secondary | ICD-10-CM | POA: Diagnosis not present

## 2016-07-18 DIAGNOSIS — D631 Anemia in chronic kidney disease: Secondary | ICD-10-CM | POA: Diagnosis not present

## 2016-07-18 DIAGNOSIS — N184 Chronic kidney disease, stage 4 (severe): Secondary | ICD-10-CM | POA: Diagnosis not present

## 2016-07-18 DIAGNOSIS — E871 Hypo-osmolality and hyponatremia: Secondary | ICD-10-CM | POA: Diagnosis not present

## 2016-07-18 DIAGNOSIS — N2581 Secondary hyperparathyroidism of renal origin: Secondary | ICD-10-CM | POA: Diagnosis not present

## 2016-07-18 DIAGNOSIS — I129 Hypertensive chronic kidney disease with stage 1 through stage 4 chronic kidney disease, or unspecified chronic kidney disease: Secondary | ICD-10-CM | POA: Diagnosis not present

## 2016-07-28 DIAGNOSIS — E7800 Pure hypercholesterolemia, unspecified: Secondary | ICD-10-CM | POA: Diagnosis not present

## 2016-07-28 DIAGNOSIS — I10 Essential (primary) hypertension: Secondary | ICD-10-CM | POA: Diagnosis not present

## 2016-07-28 DIAGNOSIS — N183 Chronic kidney disease, stage 3 (moderate): Secondary | ICD-10-CM | POA: Diagnosis not present

## 2016-08-03 DIAGNOSIS — E7800 Pure hypercholesterolemia, unspecified: Secondary | ICD-10-CM | POA: Diagnosis not present

## 2016-08-03 DIAGNOSIS — I10 Essential (primary) hypertension: Secondary | ICD-10-CM | POA: Diagnosis not present

## 2016-08-03 DIAGNOSIS — N183 Chronic kidney disease, stage 3 (moderate): Secondary | ICD-10-CM | POA: Diagnosis not present

## 2016-08-11 DIAGNOSIS — Z23 Encounter for immunization: Secondary | ICD-10-CM | POA: Diagnosis not present

## 2016-10-19 DIAGNOSIS — H353133 Nonexudative age-related macular degeneration, bilateral, advanced atrophic without subfoveal involvement: Secondary | ICD-10-CM | POA: Diagnosis not present

## 2016-11-23 DIAGNOSIS — E871 Hypo-osmolality and hyponatremia: Secondary | ICD-10-CM | POA: Diagnosis not present

## 2016-11-23 DIAGNOSIS — I129 Hypertensive chronic kidney disease with stage 1 through stage 4 chronic kidney disease, or unspecified chronic kidney disease: Secondary | ICD-10-CM | POA: Diagnosis not present

## 2016-11-23 DIAGNOSIS — D631 Anemia in chronic kidney disease: Secondary | ICD-10-CM | POA: Diagnosis not present

## 2016-11-23 DIAGNOSIS — N184 Chronic kidney disease, stage 4 (severe): Secondary | ICD-10-CM | POA: Diagnosis not present

## 2016-11-23 DIAGNOSIS — N2581 Secondary hyperparathyroidism of renal origin: Secondary | ICD-10-CM | POA: Diagnosis not present

## 2016-11-23 DIAGNOSIS — R809 Proteinuria, unspecified: Secondary | ICD-10-CM | POA: Diagnosis not present

## 2016-11-30 DIAGNOSIS — E7800 Pure hypercholesterolemia, unspecified: Secondary | ICD-10-CM | POA: Diagnosis not present

## 2016-11-30 DIAGNOSIS — N183 Chronic kidney disease, stage 3 (moderate): Secondary | ICD-10-CM | POA: Diagnosis not present

## 2016-11-30 DIAGNOSIS — I10 Essential (primary) hypertension: Secondary | ICD-10-CM | POA: Diagnosis not present

## 2017-01-02 DIAGNOSIS — R809 Proteinuria, unspecified: Secondary | ICD-10-CM | POA: Diagnosis not present

## 2017-01-02 DIAGNOSIS — I129 Hypertensive chronic kidney disease with stage 1 through stage 4 chronic kidney disease, or unspecified chronic kidney disease: Secondary | ICD-10-CM | POA: Diagnosis not present

## 2017-01-02 DIAGNOSIS — N2581 Secondary hyperparathyroidism of renal origin: Secondary | ICD-10-CM | POA: Diagnosis not present

## 2017-01-02 DIAGNOSIS — N184 Chronic kidney disease, stage 4 (severe): Secondary | ICD-10-CM | POA: Diagnosis not present

## 2017-01-02 DIAGNOSIS — E871 Hypo-osmolality and hyponatremia: Secondary | ICD-10-CM | POA: Diagnosis not present

## 2017-01-04 DIAGNOSIS — D649 Anemia, unspecified: Secondary | ICD-10-CM | POA: Diagnosis not present

## 2017-01-04 DIAGNOSIS — K219 Gastro-esophageal reflux disease without esophagitis: Secondary | ICD-10-CM | POA: Diagnosis not present

## 2017-01-04 DIAGNOSIS — I209 Angina pectoris, unspecified: Secondary | ICD-10-CM | POA: Diagnosis not present

## 2017-01-04 DIAGNOSIS — I10 Essential (primary) hypertension: Secondary | ICD-10-CM | POA: Diagnosis not present

## 2017-01-04 DIAGNOSIS — M159 Polyosteoarthritis, unspecified: Secondary | ICD-10-CM | POA: Diagnosis not present

## 2017-01-04 DIAGNOSIS — E871 Hypo-osmolality and hyponatremia: Secondary | ICD-10-CM | POA: Diagnosis not present

## 2017-01-04 DIAGNOSIS — R001 Bradycardia, unspecified: Secondary | ICD-10-CM | POA: Diagnosis not present

## 2017-01-04 DIAGNOSIS — E784 Other hyperlipidemia: Secondary | ICD-10-CM | POA: Diagnosis not present

## 2017-01-09 ENCOUNTER — Emergency Department: Payer: Medicare Other

## 2017-01-09 ENCOUNTER — Emergency Department
Admission: EM | Admit: 2017-01-09 | Discharge: 2017-01-09 | Disposition: A | Discharge status: Home / Self Care | Payer: Medicare Other | Attending: Emergency Medicine | Admitting: Emergency Medicine

## 2017-01-09 ENCOUNTER — Encounter: Payer: Self-pay | Admitting: Emergency Medicine

## 2017-01-09 DIAGNOSIS — R079 Chest pain, unspecified: Secondary | ICD-10-CM

## 2017-01-09 DIAGNOSIS — R0602 Shortness of breath: Secondary | ICD-10-CM | POA: Diagnosis present

## 2017-01-09 DIAGNOSIS — R072 Precordial pain: Secondary | ICD-10-CM | POA: Diagnosis not present

## 2017-01-09 DIAGNOSIS — I471 Supraventricular tachycardia: Secondary | ICD-10-CM | POA: Insufficient documentation

## 2017-01-09 DIAGNOSIS — R0789 Other chest pain: Secondary | ICD-10-CM | POA: Diagnosis not present

## 2017-01-09 DIAGNOSIS — I10 Essential (primary) hypertension: Secondary | ICD-10-CM | POA: Insufficient documentation

## 2017-01-09 DIAGNOSIS — Z7982 Long term (current) use of aspirin: Secondary | ICD-10-CM | POA: Diagnosis not present

## 2017-01-09 DIAGNOSIS — Z79899 Other long term (current) drug therapy: Secondary | ICD-10-CM | POA: Diagnosis not present

## 2017-01-09 HISTORY — DX: Pure hypercholesterolemia, unspecified: E78.00

## 2017-01-09 HISTORY — DX: Essential (primary) hypertension: I10

## 2017-01-09 LAB — CBC WITH DIFFERENTIAL/PLATELET
Basophils Absolute: 0 10*3/uL (ref 0–0.1)
Basophils Relative: 1 %
Eosinophils Absolute: 0.3 10*3/uL (ref 0–0.7)
Eosinophils Relative: 6 %
HEMATOCRIT: 28.8 % — AB (ref 35.0–47.0)
HEMOGLOBIN: 9.6 g/dL — AB (ref 12.0–16.0)
LYMPHS ABS: 0.5 10*3/uL — AB (ref 1.0–3.6)
LYMPHS PCT: 9 %
MCH: 31.1 pg (ref 26.0–34.0)
MCHC: 33.2 g/dL (ref 32.0–36.0)
MCV: 93.4 fL (ref 80.0–100.0)
MONO ABS: 0.5 10*3/uL (ref 0.2–0.9)
Monocytes Relative: 10 %
NEUTROS ABS: 3.9 10*3/uL (ref 1.4–6.5)
NEUTROS PCT: 74 %
Platelets: 213 10*3/uL (ref 150–440)
RBC: 3.08 MIL/uL — ABNORMAL LOW (ref 3.80–5.20)
RDW: 12.9 % (ref 11.5–14.5)
WBC: 5.2 10*3/uL (ref 3.6–11.0)

## 2017-01-09 LAB — PROTIME-INR
INR: 1.01
Prothrombin Time: 13.3 seconds (ref 11.4–15.2)

## 2017-01-09 LAB — COMPREHENSIVE METABOLIC PANEL
ALBUMIN: 3.8 g/dL (ref 3.5–5.0)
ALT: 10 U/L — ABNORMAL LOW (ref 14–54)
ANION GAP: 10 (ref 5–15)
AST: 21 U/L (ref 15–41)
Alkaline Phosphatase: 69 U/L (ref 38–126)
BILIRUBIN TOTAL: 0.7 mg/dL (ref 0.3–1.2)
BUN: 28 mg/dL — AB (ref 6–20)
CO2: 22 mmol/L (ref 22–32)
Calcium: 9 mg/dL (ref 8.9–10.3)
Chloride: 98 mmol/L — ABNORMAL LOW (ref 101–111)
Creatinine, Ser: 1.8 mg/dL — ABNORMAL HIGH (ref 0.44–1.00)
GFR calc Af Amer: 27 mL/min — ABNORMAL LOW (ref >60)
GFR calc non Af Amer: 24 mL/min — ABNORMAL LOW (ref >60)
GLUCOSE: 99 mg/dL (ref 65–99)
POTASSIUM: 4 mmol/L (ref 3.5–5.1)
SODIUM: 130 mmol/L — AB (ref 135–145)
Total Protein: 6.6 g/dL (ref 6.5–8.1)

## 2017-01-09 LAB — TROPONIN I: Troponin I: <0.03 ng/mL (ref <0.03)

## 2017-01-09 MED ORDER — ASPIRIN 81 MG PO CHEW
CHEWABLE_TABLET | ORAL | Status: AC
  Filled 2017-01-09: qty 4

## 2017-01-09 MED ORDER — ASPIRIN 81 MG PO CHEW
162.0000 mg | CHEWABLE_TABLET | Freq: Once | ORAL | Status: AC
  Administered 2017-01-09: 162 mg via ORAL

## 2017-01-09 MED ORDER — ADENOSINE 6 MG/2ML IV SOLN
INTRAVENOUS | Status: AC
  Filled 2017-01-09: qty 4

## 2017-01-09 NOTE — Discharge Instructions (Signed)
Please follow-up with your cardiologist Dr. Juliann Paresallwood tomorrow for reevaluation. Return to the emergency department sooner for any new or worsening symptoms such as chest pain shortness of breath if he passed out or for any other concerns.

## 2017-01-09 NOTE — ED Triage Notes (Signed)
Pt to ed with c/o chest pain acute onset about 30 minute pta.  Pt also sob with pain. EKG done at triage

## 2017-01-09 NOTE — ED Provider Notes (Signed)
[facility] Emergency Department Provider Note  ____________________________________________   None    (approximate)  I have reviewed the triage vital signs and the nursing notes.   HISTORY  Chief Complaint Shortness of Breath and Chest Pain   HPI Laura [name] is a 83 y.o. female who comes to the emergency department with a 30 minute episode of sudden onset chest pain and palpitations. She was on her way to see her primary care physician for routine checkup when in the car she began to feel palpitations and chest discomfort. When she arrived at the doctor's office her heart rate was elevated and she was sent to the emergency department. Her pain resolved as well as her palpitations spontaneously with no treatment. She said her pain was moderate uncomfortable substernal nonradiating. Nothing made it better or worse. She does have a history of an NSTEMI 2 years ago.   01/04/17 [facility] note:  Assessment   83 y.o. female with  1. Essential hypertension  2. Generalized OA  3. Bradycardia  4. Hyponatremia  5. Gastroesophageal reflux disease without esophagitis  6. AP (angina pectoris) (CMS-HCC)  7. Anemia, unspecified type  8. Other hyperlipidemia  9. Paroxysmal atrial fibrillation (CMS-HCC)   Plan  1 hypertension reasonably improved and controlled continue current therapy currently on Coreg hydralazine lisinopril nifedipine 2 GI bleed by history with recurrent anemia but recently stable continue supplemental iron therapy 3 GERD chronic recurrent recommend continue Protonix therapy consider adding Zantac 4 angina stable no recent episodes continue imdur Coreg lisinopril and statin 5 palpitations recurrent reasonably controlled continue Coreg 6 chronic renal insufficiency continue to follow-up with nephrology 7 hyperlipidemia continue simvastatin therapy for lipid management 8 have the patient follow-up in 6 months  Return in about 6 months  (around 07/04/2017).  [name], MD          Past Medical History:  Diagnosis Date  . High cholesterol   . Hypertension     There are no active problems to display for this patient.   History reviewed. No pertinent surgical history.  Prior to Admission medications   Medication Sig Start Date End Date Taking? Authorizing Provider  aspirin EC 81 MG tablet Take 81 mg by mouth daily.   Yes Historical Provider, MD  carvedilol (COREG) 12.5 MG tablet Take 12.5 mg by mouth 2 (two) times daily. 12/23/16  Yes Historical Provider, MD  docusate sodium (COLACE) 100 MG capsule Take 100 mg by mouth 2 (two) times daily. 01/02/17  Yes Historical Provider, MD  Ferrous Gluconate 324 (37.5 Fe) MG TABS Take 1 tablet by mouth 2 (two) times daily. 11/15/16  Yes Historical Provider, MD  hydrALAZINE (APRESOLINE) 100 MG tablet Take 100 mg by mouth 2 (two) times daily. 12/23/16  Yes Historical Provider, MD  isosorbide mononitrate (IMDUR) 120 MG 24 hr tablet Take 1 tablet by mouth daily. 12/23/16  Yes Historical Provider, MD  lisinopril (PRINIVIL,ZESTRIL) 10 MG tablet Take 10 mg by mouth daily. 11/15/16  Yes Historical Provider, MD  Lutein 20 MG TABS Take 1 tablet by mouth daily.   Yes Historical Provider, MD  NIFEdipine (PROCARDIA-XL/ADALAT-CC/NIFEDICAL-XL) 30 MG 24 hr tablet Take 30 mg by mouth daily. 12/23/16  Yes Historical Provider, MD  pantoprazole (PROTONIX) 40 MG tablet Take 40 mg by mouth daily. 12/23/16  Yes Historical Provider, MD  simvastatin (ZOCOR) 40 MG tablet Take 40 mg by mouth daily. 12/23/16  Yes Historical Provider, MD  torsemide (DEMADEX) 20 MG tablet Take 10  mg by mouth daily.   Yes Historical Provider, MD  acetaminophen (TYLENOL) 500 MG tablet Take 1,000 mg by mouth daily. Take 2 tabs (1000mg ) by mouth every morning.  Take 2 tablets in evening if needed.    Historical Provider, MD  Fe Fum-Vit C-Vit B12-FA (TRIGELS-F FORTE) 460-60-0.01-1 MG CAPS capsule Take 1 tablet by mouth daily.     Historical Provider, MD  fluticasone (FLONASE) 50 MCG/ACT nasal spray Place 2 sprays into both nostrils daily.    Historical Provider, MD  traMADol (ULTRAM) 50 MG tablet Take 50 mg by mouth every 6 (six) hours as needed for pain.    Historical Provider, MD    Allergies Penicillins; Codeine; Amlodipine; Ciprofloxacin; and Sulfa antibiotics  History reviewed. No pertinent family history.  Social History Social History  Substance Use Topics  . Smoking status: Never Smoker  . Smokeless tobacco: Never Used  . Alcohol use No    Review of Systems Constitutional: No fever/chills Eyes: No visual changes. ENT: No sore throat. Cardiovascular: Positive for palpitations and chest pain. Respiratory: Denies shortness of breath. Gastrointestinal: No abdominal pain.  No nausea, no vomiting.  No diarrhea.  No constipation. Genitourinary: Negative for dysuria. Musculoskeletal: Negative for back pain. Skin: Negative for rash. Neurological: Negative for headaches, focal weakness or numbness.  10-point ROS otherwise negative.  ____________________________________________   PHYSICAL EXAM:  VITAL SIGNS: ED Triage Vitals [01/09/17 1310]  Enc Vitals Group     BP (!) 194/121     Pulse Rate (!) 133     Resp (!) 22     Temp 98.7 F (37.1 C)     Temp Source Oral     SpO2 99 %     Weight 126 lb (57.2 kg)     Height      Head Circumference      Peak Flow      Pain Score 6     Pain Loc      Pain Edu?      Excl. in GC?     Constitutional: Alert and oriented. Well appearing and in no acute distress. Eyes: Conjunctivae are normal. PERRL. EOMI. Head: Atraumatic. Nose: No congestion/rhinnorhea. Mouth/Throat: Mucous membranes are moist.  Oropharynx non-erythematous. Neck: No stridor.   Cardiovascular: Normal rate, regular rhythm. Grossly normal heart sounds.  Good peripheral circulation. Respiratory: Normal respiratory effort.  No retractions. Lungs CTAB. Gastrointestinal: Soft and  nontender. No distention. No abdominal bruits. No CVA tenderness. Musculoskeletal: No lower extremity tenderness nor edema.  No joint effusions. Neurologic:  Normal speech and language. No gross focal neurologic deficits are appreciated. No gait instability. Skin:  Skin is warm, dry and intact. No rash noted. Psychiatric: Mood and affect are normal. Speech and behavior are normal.  ____________________________________________   LABS (all labs ordered are listed, but only abnormal results are displayed)  Labs Reviewed  COMPREHENSIVE METABOLIC PANEL - Abnormal; Notable for the following:       Result Value   Sodium 130 (*)    Chloride 98 (*)    BUN 28 (*)    Creatinine, Ser 1.80 (*)    ALT 10 (*)    GFR calc non Af Amer 24 (*)    GFR calc Af Amer 27 (*)    All other components within normal limits  CBC WITH DIFFERENTIAL/PLATELET - Abnormal; Notable for the following:    RBC 3.08 (*)    Hemoglobin 9.6 (*)    HCT 28.8 (*)    Lymphs Abs 0.5 (*)  All other components within normal limits  TROPONIN I  PROTIME-INR   ____________________________________________  EKG  ED ECG REPORT I, Merrily Brittle, the attending physician, personally viewed and interpreted this ECG.  Date: 01/09/2017 EKG Time: 1308 Rate: 133 Rhythm: normal sinus rhythm QRS Axis: Left axis deviation Intervals: normal ST/T Wave abnormalities: Diffuse ST depression Conduction Disturbances: none Narrative Interpretation: No appreciable P waves supraventricular tachycardia with diffuse ST depression consistent with subendocardial ischemia no frank ST elevation  ____________________________________________  RADIOLOGY  No acute disease ____________________________________________   PROCEDURES  Procedure(s) performed: no  Procedures  Critical Care performed: no  ____________________________________________   INITIAL IMPRESSION / ASSESSMENT AND PLAN / ED COURSE  Pertinent labs & imaging results  that were available during my care of the patient were reviewed by me and considered in my medical decision making (see chart for details).  By the time I arrived in the patient's room she guarded he spontaneously converted back to normal sinus rhythm. I'll check electrolytes troponin and reevaluate.     ----------------------------------------- 3:38 PM on 01/09/2017 -----------------------------------------  I discussed the case with Dr. Gwen Pounds on call for Dr. Elijah Birk who recommends outpatient management with follow-up tomorrow and no particular medication changes now. The patient feels relieved and would like to go home. At this point she is medically stable for outpatient management. ____________________________________________   FINAL CLINICAL IMPRESSION(S) / ED DIAGNOSES  Final diagnoses:  SVT (supraventricular tachycardia) (HCC)  Chest pain, unspecified type      NEW MEDICATIONS STARTED DURING THIS VISIT:  Discharge Medication List as of 01/09/2017  3:44 PM       Note:  This document was prepared using Dragon voice recognition software and may include unintentional dictation errors.     Merrily Brittle, MD 01/09/17 2022

## 2017-01-16 DIAGNOSIS — N183 Chronic kidney disease, stage 3 (moderate): Secondary | ICD-10-CM | POA: Diagnosis not present

## 2017-01-16 DIAGNOSIS — I10 Essential (primary) hypertension: Secondary | ICD-10-CM | POA: Diagnosis not present

## 2017-01-16 DIAGNOSIS — R0781 Pleurodynia: Secondary | ICD-10-CM | POA: Diagnosis not present

## 2017-01-16 DIAGNOSIS — E7800 Pure hypercholesterolemia, unspecified: Secondary | ICD-10-CM | POA: Diagnosis not present

## 2017-01-16 DIAGNOSIS — I209 Angina pectoris, unspecified: Secondary | ICD-10-CM | POA: Diagnosis not present

## 2017-02-19 ENCOUNTER — Encounter: Payer: Self-pay | Admitting: Emergency Medicine

## 2017-02-19 ENCOUNTER — Emergency Department: Payer: Medicare Other

## 2017-02-19 ENCOUNTER — Inpatient Hospital Stay
Admission: EM | Admit: 2017-02-19 | Discharge: 2017-02-27 | DRG: 291 | Disposition: A | Discharge status: Home Health | Payer: Medicare Other | Attending: Internal Medicine | Admitting: Internal Medicine

## 2017-02-19 DIAGNOSIS — Z7982 Long term (current) use of aspirin: Secondary | ICD-10-CM | POA: Diagnosis not present

## 2017-02-19 DIAGNOSIS — I5023 Acute on chronic systolic (congestive) heart failure: Secondary | ICD-10-CM | POA: Diagnosis present

## 2017-02-19 DIAGNOSIS — R7989 Other specified abnormal findings of blood chemistry: Secondary | ICD-10-CM | POA: Diagnosis not present

## 2017-02-19 DIAGNOSIS — E871 Hypo-osmolality and hyponatremia: Secondary | ICD-10-CM | POA: Diagnosis present

## 2017-02-19 DIAGNOSIS — R0602 Shortness of breath: Secondary | ICD-10-CM

## 2017-02-19 DIAGNOSIS — I4891 Unspecified atrial fibrillation: Secondary | ICD-10-CM | POA: Diagnosis not present

## 2017-02-19 DIAGNOSIS — J441 Chronic obstructive pulmonary disease with (acute) exacerbation: Secondary | ICD-10-CM | POA: Diagnosis present

## 2017-02-19 DIAGNOSIS — Z79899 Other long term (current) drug therapy: Secondary | ICD-10-CM

## 2017-02-19 DIAGNOSIS — J9601 Acute respiratory failure with hypoxia: Secondary | ICD-10-CM | POA: Diagnosis present

## 2017-02-19 DIAGNOSIS — N179 Acute kidney failure, unspecified: Secondary | ICD-10-CM | POA: Diagnosis present

## 2017-02-19 DIAGNOSIS — D631 Anemia in chronic kidney disease: Secondary | ICD-10-CM | POA: Diagnosis present

## 2017-02-19 DIAGNOSIS — Z66 Do not resuscitate: Secondary | ICD-10-CM | POA: Diagnosis present

## 2017-02-19 DIAGNOSIS — I509 Heart failure, unspecified: Secondary | ICD-10-CM | POA: Diagnosis not present

## 2017-02-19 DIAGNOSIS — K219 Gastro-esophageal reflux disease without esophagitis: Secondary | ICD-10-CM | POA: Diagnosis present

## 2017-02-19 DIAGNOSIS — E782 Mixed hyperlipidemia: Secondary | ICD-10-CM | POA: Diagnosis present

## 2017-02-19 DIAGNOSIS — I482 Chronic atrial fibrillation: Secondary | ICD-10-CM | POA: Diagnosis present

## 2017-02-19 DIAGNOSIS — I10 Essential (primary) hypertension: Secondary | ICD-10-CM | POA: Diagnosis not present

## 2017-02-19 DIAGNOSIS — N184 Chronic kidney disease, stage 4 (severe): Secondary | ICD-10-CM | POA: Diagnosis present

## 2017-02-19 DIAGNOSIS — E1022 Type 1 diabetes mellitus with diabetic chronic kidney disease: Secondary | ICD-10-CM | POA: Diagnosis not present

## 2017-02-19 DIAGNOSIS — M810 Age-related osteoporosis without current pathological fracture: Secondary | ICD-10-CM | POA: Diagnosis present

## 2017-02-19 DIAGNOSIS — Z880 Allergy status to penicillin: Secondary | ICD-10-CM

## 2017-02-19 DIAGNOSIS — E785 Hyperlipidemia, unspecified: Secondary | ICD-10-CM | POA: Diagnosis present

## 2017-02-19 DIAGNOSIS — R809 Proteinuria, unspecified: Secondary | ICD-10-CM | POA: Diagnosis not present

## 2017-02-19 DIAGNOSIS — I480 Paroxysmal atrial fibrillation: Secondary | ICD-10-CM | POA: Diagnosis present

## 2017-02-19 DIAGNOSIS — N189 Chronic kidney disease, unspecified: Secondary | ICD-10-CM | POA: Diagnosis not present

## 2017-02-19 DIAGNOSIS — J811 Chronic pulmonary edema: Secondary | ICD-10-CM | POA: Diagnosis not present

## 2017-02-19 DIAGNOSIS — I110 Hypertensive heart disease with heart failure: Secondary | ICD-10-CM | POA: Diagnosis not present

## 2017-02-19 DIAGNOSIS — I129 Hypertensive chronic kidney disease with stage 1 through stage 4 chronic kidney disease, or unspecified chronic kidney disease: Secondary | ICD-10-CM | POA: Diagnosis not present

## 2017-02-19 DIAGNOSIS — I130 Hypertensive heart and chronic kidney disease with heart failure and stage 1 through stage 4 chronic kidney disease, or unspecified chronic kidney disease: Secondary | ICD-10-CM | POA: Diagnosis not present

## 2017-02-19 DIAGNOSIS — I5021 Acute systolic (congestive) heart failure: Secondary | ICD-10-CM

## 2017-02-19 HISTORY — DX: Chronic kidney disease, unspecified: N18.9

## 2017-02-19 HISTORY — DX: Heart failure, unspecified: I50.9

## 2017-02-19 LAB — COMPREHENSIVE METABOLIC PANEL
ALT: 11 U/L — AB (ref 14–54)
ANION GAP: 9 (ref 5–15)
AST: 18 U/L (ref 15–41)
Albumin: 3.2 g/dL — ABNORMAL LOW (ref 3.5–5.0)
Alkaline Phosphatase: 62 U/L (ref 38–126)
BUN: 43 mg/dL — ABNORMAL HIGH (ref 6–20)
CALCIUM: 8.7 mg/dL — AB (ref 8.9–10.3)
CO2: 23 mmol/L (ref 22–32)
Chloride: 93 mmol/L — ABNORMAL LOW (ref 101–111)
Creatinine, Ser: 2.28 mg/dL — ABNORMAL HIGH (ref 0.44–1.00)
GFR calc Af Amer: 21 mL/min — ABNORMAL LOW (ref >60)
GFR calc non Af Amer: 18 mL/min — ABNORMAL LOW (ref >60)
Glucose, Bld: 102 mg/dL — ABNORMAL HIGH (ref 65–99)
POTASSIUM: 4.3 mmol/L (ref 3.5–5.1)
SODIUM: 125 mmol/L — AB (ref 135–145)
Total Bilirubin: 0.5 mg/dL (ref 0.3–1.2)
Total Protein: 5.9 g/dL — ABNORMAL LOW (ref 6.5–8.1)

## 2017-02-19 LAB — CBC
HCT: 22.9 % — ABNORMAL LOW (ref 35.0–47.0)
Hemoglobin: 7.9 g/dL — ABNORMAL LOW (ref 12.0–16.0)
MCH: 31.7 pg (ref 26.0–34.0)
MCHC: 34.4 g/dL (ref 32.0–36.0)
MCV: 91.9 fL (ref 80.0–100.0)
PLATELETS: 222 10*3/uL (ref 150–440)
RBC: 2.49 MIL/uL — AB (ref 3.80–5.20)
RDW: 12.7 % (ref 11.5–14.5)
WBC: 4.5 10*3/uL (ref 3.6–11.0)

## 2017-02-19 LAB — BRAIN NATRIURETIC PEPTIDE: B NATRIURETIC PEPTIDE 5: 274 pg/mL — AB (ref 0.0–100.0)

## 2017-02-19 LAB — TROPONIN I: Troponin I: <0.03 ng/mL (ref <0.03)

## 2017-02-19 MED ORDER — HEPARIN SODIUM (PORCINE) 5000 UNIT/ML IJ SOLN
5000.0000 [IU] | Freq: Three times a day (TID) | INTRAMUSCULAR | Status: DC
  Administered 2017-02-19 – 2017-02-27 (×23): 5000 [IU] via SUBCUTANEOUS
  Filled 2017-02-19 (×24): qty 1

## 2017-02-19 MED ORDER — TRAMADOL HCL 50 MG PO TABS: 50.0000 mg | ORAL_TABLET | Freq: Two times a day (BID) | ORAL | Status: DC | PRN

## 2017-02-19 MED ORDER — CARVEDILOL 12.5 MG PO TABS
12.5000 mg | ORAL_TABLET | Freq: Two times a day (BID) | ORAL | Status: DC
  Administered 2017-02-20 – 2017-02-23 (×9): 12.5 mg via ORAL
  Filled 2017-02-19 (×10): qty 1

## 2017-02-19 MED ORDER — ORAL CARE MOUTH RINSE
15.0000 mL | Freq: Two times a day (BID) | OROMUCOSAL | Status: DC
  Administered 2017-02-20 – 2017-02-26 (×7): 15 mL via OROMUCOSAL

## 2017-02-19 MED ORDER — FUROSEMIDE 10 MG/ML IJ SOLN
20.0000 mg | Freq: Three times a day (TID) | INTRAMUSCULAR | Status: DC
  Administered 2017-02-19 – 2017-02-22 (×8): 20 mg via INTRAVENOUS
  Filled 2017-02-19: qty 2
  Filled 2017-02-19: qty 4
  Filled 2017-02-19 (×6): qty 2

## 2017-02-19 MED ORDER — ISOSORBIDE MONONITRATE ER 60 MG PO TB24
120.0000 mg | ORAL_TABLET | Freq: Every day | ORAL | Status: DC
  Administered 2017-02-20 – 2017-02-24 (×5): 120 mg via ORAL
  Filled 2017-02-19 (×7): qty 2

## 2017-02-19 MED ORDER — LUTEIN 20 MG PO TABS
1.0000 | ORAL_TABLET | Freq: Every day | ORAL | Status: DC
Start: 2017-02-20 — End: 2017-02-19

## 2017-02-19 MED ORDER — ACETAMINOPHEN 500 MG PO TABS
1000.0000 mg | ORAL_TABLET | Freq: Every day | ORAL | Status: DC
  Administered 2017-02-20 – 2017-02-27 (×8): 1000 mg via ORAL
  Filled 2017-02-19 (×10): qty 2

## 2017-02-19 MED ORDER — HYDRALAZINE HCL 50 MG PO TABS
100.0000 mg | ORAL_TABLET | Freq: Two times a day (BID) | ORAL | Status: DC
  Administered 2017-02-20 – 2017-02-24 (×11): 100 mg via ORAL
  Filled 2017-02-19 (×13): qty 2

## 2017-02-19 MED ORDER — ACETAMINOPHEN 500 MG PO TABS
1000.0000 mg | ORAL_TABLET | Freq: Every evening | ORAL | Status: DC | PRN
  Filled 2017-02-19: qty 2

## 2017-02-19 MED ORDER — PANTOPRAZOLE SODIUM 40 MG PO TBEC
40.0000 mg | DELAYED_RELEASE_TABLET | Freq: Every day | ORAL | Status: DC
  Administered 2017-02-20 – 2017-02-27 (×8): 40 mg via ORAL
  Filled 2017-02-19 (×10): qty 1

## 2017-02-19 MED ORDER — NIFEDIPINE ER OSMOTIC RELEASE 30 MG PO TB24
30.0000 mg | ORAL_TABLET | Freq: Every day | ORAL | Status: DC
  Administered 2017-02-20 – 2017-02-24 (×5): 30 mg via ORAL
  Filled 2017-02-19 (×6): qty 1

## 2017-02-19 MED ORDER — DOCUSATE SODIUM 100 MG PO CAPS
100.0000 mg | ORAL_CAPSULE | Freq: Two times a day (BID) | ORAL | Status: DC
  Administered 2017-02-20 – 2017-02-27 (×16): 100 mg via ORAL
  Filled 2017-02-19 (×18): qty 1

## 2017-02-19 MED ORDER — DOCUSATE SODIUM 100 MG PO CAPS: 100.0000 mg | ORAL_CAPSULE | Freq: Two times a day (BID) | ORAL | Status: DC | PRN

## 2017-02-19 MED ORDER — OCUVITE-LUTEIN PO CAPS
1.0000 | ORAL_CAPSULE | Freq: Every day | ORAL | Status: DC
  Administered 2017-02-20 – 2017-02-27 (×8): 1 via ORAL
  Filled 2017-02-19 (×10): qty 1

## 2017-02-19 MED ORDER — ACETAMINOPHEN 325 MG PO TABS: 650.0000 mg | ORAL_TABLET | Freq: Every evening | ORAL | Status: DC | PRN

## 2017-02-19 MED ORDER — LISINOPRIL 10 MG PO TABS
10.0000 mg | ORAL_TABLET | Freq: Every day | ORAL | Status: DC
  Administered 2017-02-20 – 2017-02-24 (×5): 10 mg via ORAL
  Filled 2017-02-19 (×8): qty 1

## 2017-02-19 MED ORDER — FLUTICASONE PROPIONATE 50 MCG/ACT NA SUSP
2.0000 | Freq: Every day | NASAL | Status: DC
  Administered 2017-02-20 – 2017-02-26 (×7): 2 via NASAL
  Filled 2017-02-19 (×2): qty 16

## 2017-02-19 MED ORDER — ASPIRIN EC 81 MG PO TBEC
81.0000 mg | DELAYED_RELEASE_TABLET | Freq: Every day | ORAL | Status: DC
  Administered 2017-02-20 – 2017-02-27 (×8): 81 mg via ORAL
  Filled 2017-02-19 (×10): qty 1

## 2017-02-19 MED ORDER — FERROUS GLUCONATE 324 (37.5 FE) MG PO TABS
1.0000 | ORAL_TABLET | Freq: Two times a day (BID) | ORAL | Status: DC
  Administered 2017-02-20 – 2017-02-26 (×14): 324 mg via ORAL
  Filled 2017-02-19 (×17): qty 1

## 2017-02-19 MED ORDER — SIMVASTATIN 40 MG PO TABS
40.0000 mg | ORAL_TABLET | Freq: Every day | ORAL | Status: DC
  Administered 2017-02-20 – 2017-02-27 (×8): 40 mg via ORAL
  Filled 2017-02-19 (×10): qty 1

## 2017-02-19 MED ORDER — FUROSEMIDE 10 MG/ML IJ SOLN
40.0000 mg | Freq: Once | INTRAMUSCULAR | Status: AC
  Administered 2017-02-19: 40 mg via INTRAVENOUS
  Filled 2017-02-19: qty 4

## 2017-02-19 NOTE — ED Notes (Signed)
Pt. Ambulated to bathroom, urine output 200 ml

## 2017-02-19 NOTE — ED Notes (Signed)
Pt. Ambulated to toilet, output 200 ml.

## 2017-02-19 NOTE — H&P (Signed)
[employer] [facility] [place] [facility]   PATIENT NAME: Laura [name]    MR#:  [id number]  DATE OF BIRTH:  Nov 11, 1924  DATE OF ADMISSION:  02/19/2017  PRIMARY CARE PHYSICIAN: Marisue Ivan, MD   REQUESTING/REFERRING PHYSICIAN: [place]  CHIEF COMPLAINT:   Chief Complaint  Patient presents with  . Shortness of Breath    HISTORY OF PRESENT ILLNESS: Laura [name]  is a 83 y.o. female with a known history of Congestive heart failure, chronic kidney disease, hypertension, hypercholesterolemia- for last 1 week feeling increasingly shortness of breath and has also symptoms of orthopnea with swelling on her legs. Concerned with this she came to emergency room, she denies any chest pain or cough. Found to have CHF and given to hospitalist for further management.  PAST MEDICAL HISTORY:   Past Medical History:  Diagnosis Date  . CHF (congestive heart failure) (HCC)   . Chronic kidney disease   . High cholesterol   . Hypertension     PAST SURGICAL HISTORY: No past surgical history on file.  SOCIAL HISTORY:  Social History  Substance Use Topics  . Smoking status: Never Smoker  . Smokeless tobacco: Never Used  . Alcohol use No    FAMILY HISTORY:  Family History  Problem Relation Age of Onset  . CAD Father   . Pancreatic cancer Son     DRUG ALLERGIES:  Allergies  Allergen Reactions  . Penicillins Shortness Of Breath        . Codeine Itching  . Amlodipine Rash    Peripheral edema  . Ciprofloxacin Rash  . Sulfa Antibiotics Rash    REVIEW OF SYSTEMS:   CONSTITUTIONAL: No fever, fatigue or weakness.  EYES: No blurred or double vision.  EARS, NOSE, AND THROAT: No tinnitus or ear pain.  RESPIRATORY: No cough,Positive for shortness of breath, no wheezing or hemoptysis.  CARDIOVASCULAR: No chest pain, positive for orthopnea, edema.  GASTROINTESTINAL: No nausea, vomiting, diarrhea or abdominal pain.  GENITOURINARY: No dysuria, hematuria.  ENDOCRINE: No  polyuria, nocturia,  HEMATOLOGY: No anemia, easy bruising or bleeding SKIN: No rash or lesion. MUSCULOSKELETAL: No joint pain or arthritis.   NEUROLOGIC: No tingling, numbness, weakness.  PSYCHIATRY: No anxiety or depression.   MEDICATIONS AT HOME:  Prior to Admission medications   Medication Sig Start Date End Date Taking? Authorizing Provider  acetaminophen (TYLENOL) 500 MG tablet Take 1,000 mg by mouth daily. Take 2 tabs ( ) by mouth every morning.  Take 2 tablets in evening if needed.   Yes Historical Provider, MD  aspirin EC 81 MG tablet Take 81 mg by mouth daily.   Yes Historical Provider, MD  carvedilol (COREG) 12.5 MG tablet Take 12.5 mg by mouth 2 (two) times daily. 12/23/16  Yes Historical Provider, MD  docusate sodium (COLACE) 100 MG capsule Take 100 mg by mouth 2 (two) times daily. 01/02/17  Yes Historical Provider, MD  Fe Fum-Vit C-Vit B12-FA (TRIGELS-F FORTE) 460-60-0.01-1 MG CAPS capsule Take 1 tablet by mouth daily.   Yes Historical Provider, MD  Ferrous Gluconate 324 (37.5 Fe) MG TABS Take 1 tablet by mouth 2 (two) times daily. 11/15/16  Yes Historical Provider, MD  fluticasone (FLONASE) 50 MCG/ACT nasal spray Place 2 sprays into both nostrils daily.   Yes Historical Provider, MD  hydrALAZINE (APRESOLINE) 100 MG tablet Take 100 mg by mouth 2 (two) times daily. 12/23/16  Yes Historical Provider, MD  isosorbide mononitrate (IMDUR) 120 MG 24 hr tablet Take 1 tablet by mouth daily. 12/23/16  Yes Historical Provider, MD  lisinopril (PRINIVIL,ZESTRIL) 10 MG tablet Take 10 mg by mouth daily. 11/15/16  Yes Historical Provider, MD  Lutein 20 MG TABS Take 1 tablet by mouth daily.   Yes Historical Provider, MD  NIFEdipine (PROCARDIA-XL/ADALAT-CC/NIFEDICAL-XL) 30 MG 24 hr tablet Take 30 mg by mouth daily. 12/23/16  Yes Historical Provider, MD  pantoprazole (PROTONIX) 40 MG tablet Take 40 mg by mouth daily. 12/23/16  Yes Historical Provider, MD  simvastatin (ZOCOR) 40 MG tablet Take 40 mg by  mouth daily. 12/23/16  Yes Historical Provider, MD  torsemide (DEMADEX) 20 MG tablet Take 20 mg by mouth daily.    Yes Historical Provider, MD  traMADol (ULTRAM) 50 MG tablet Take 50 mg by mouth every 6 (six) hours as needed for pain.   Yes Historical Provider, MD      PHYSICAL EXAMINATION:   VITAL SIGNS: Blood pressure (!) 188/67, pulse 63, temperature 98 F (36.7 C), temperature source Oral, resp. rate 18, height  (1.448 m), weight 56.7 kg (125 lb), SpO2 94 %.  GENERAL:  83 y.o.-year-old patient lying in the bed with no acute distress.  EYES: Pupils equal, round, reactive to light and accommodation. No scleral icterus. Extraocular muscles intact.  HEENT: Head atraumatic, normocephalic. Oropharynx and nasopharynx clear.  NECK:  Supple, no jugular venous distention. No thyroid enlargement, no tenderness.  LUNGS: Normal breath sounds bilaterally, no wheezing, bilateral crepitation. No use of accessory muscles of respiration.  CARDIOVASCULAR: S1, S2 normal. No murmurs, rubs, or gallops.  ABDOMEN: Soft, nontender, nondistended. Bowel sounds present. No organomegaly or mass.  EXTREMITIES: Bilateral pedal edema, no cyanosis, or clubbing.  NEUROLOGIC: Cranial nerves II through XII are intact. Muscle strength 5/5 in all extremities. Sensation intact. Gait not checked.  PSYCHIATRIC: The patient is alert and oriented x 3.  SKIN: No obvious rash, lesion, or ulcer.   LABORATORY PANEL:   CBC  Recent Labs Lab 02/19/17 1907  WBC 4.5  HGB 7.9*  HCT 22.9*  PLT 222  MCV 91.9  MCH 31.7  MCHC 34.4  RDW 12.7   ------------------------------------------------------------------------------------------------------------------  Chemistries   Recent Labs Lab 02/19/17 1907  NA 125*  K 4.3  CL 93*  CO2 23  GLUCOSE 102*  BUN 43*  CREATININE 2.28*  CALCIUM 8.7*  AST 18  ALT 11*  ALKPHOS 62  BILITOT 0.5    ------------------------------------------------------------------------------------------------------------------ estimated creatinine clearance is 11.9 mL/min (A) (by C-G formula based on SCr of 2.28 mg/dL (H)). ------------------------------------------------------------------------------------------------------------------ No results for input(s): TSH, T4TOTAL, T3FREE, THYROIDAB in the last 72 hours.  Invalid input(s): FREET3   Coagulation profile No results for input(s): INR, PROTIME in the last 168 hours. ------------------------------------------------------------------------------------------------------------------- No results for input(s): DDIMER in the last 72 hours. -------------------------------------------------------------------------------------------------------------------  Cardiac Enzymes  Recent Labs Lab 02/19/17 1907  TROPONINI <0.03   ------------------------------------------------------------------------------------------------------------------ Invalid input(s): POCBNP  ---------------------------------------------------------------------------------------------------------------  Urinalysis    Component Value Date/Time   COLORURINE Yellow 09/02/2014 0508   APPEARANCEUR Clear 09/02/2014 0508   LABSPEC 1.006 09/02/2014 0508   PHURINE 7.0 09/02/2014 0508   GLUCOSEU Negative 09/02/2014 0508   HGBUR 1+ 09/02/2014 0508   BILIRUBINUR Negative 09/02/2014 0508   KETONESUR Negative 09/02/2014 0508   PROTEINUR 100 mg/dL 16/08/9603 5409   NITRITE Negative 09/02/2014 0508   LEUKOCYTESUR Negative 09/02/2014 0508     RADIOLOGY: Dg Chest 2 View  Result Date: 02/19/2017 CLINICAL DATA:  Dyspnea EXAM: CHEST  2 VIEW COMPARISON:  01/09/2017 chest radiograph. FINDINGS: Stable cardiomediastinal silhouette with mild cardiomegaly and  tortuous atherosclerotic thoracic aorta. No pneumothorax. Trace bilateral pleural effusions. Hyperinflated lungs. Mild pulmonary  edema. Mild left basilar scarring versus atelectasis. IMPRESSION: 1. Mild congestive heart failure. 2. Trace bilateral pleural effusions. 3. Mild scarring versus atelectasis at the left lung base. 4. Hyperinflated lungs, suggesting underlying obstructive lung disease. 5. Aortic atherosclerosis. Electronically Signed   By: Delbert Phenix M.D.   On: 02/19/2017 18:29    EKG: Orders placed or performed during the hospital encounter of 02/19/17  . EKG 12-Lead  . EKG 12-Lead  . ED EKG  . ED EKG    IMPRESSION AND PLAN:  * Acute congestive heart failure   We do not have previous echocardiogram to compare her EF.   We'll give IV Lasix, intake and output measurement, fluid restriction.    * Hypertension   In the home medications for now, if her EF is less than 30% female need to add ACE inhibitors.  * Chronic kidney disease stage III   Appears stable currently, continue monitoring with Lasix.  * Hyperlipidemia   Takes simvastatin, continue.  All the records are reviewed and case discussed with ED provider. Management plans discussed with the patient, family and they are in agreement.  CODE STATUS: DO NOT RESUSCITATE Code Status History    This patient does not have a recorded code status. Please follow your organizational policy for patients in this situation.    Advance Directive Documentation     Most Recent Value  Type of Advance Directive  Living will  Pre-existing out of facility DNR order (yellow form or pink MOST form)  -  "MOST" Form in Place?  -     Patient's daughter was present in the room, as per patient she has a living will done and she is DO NOT RESUSCITATE.  TOTAL TIME TAKING CARE OF THIS PATIENT: 50 minutes.    Altamese Dilling M.D on 02/19/2017   Between 7am to 6pm - Pager - [phone]  After 6pm go to [email or web address] - password [email or web address]  [facility]  Office  [phone]  CC: Primary care physician; Marisue Ivan, MD   Note:  This dictation was prepared with Dragon dictation along with smaller phrase technology. Any transcriptional errors that result from this process are unintentional.

## 2017-02-19 NOTE — ED Triage Notes (Signed)
Pt reports shortness of breath x3 days, reports she saw PCP and they doubled her torsemide. Pt with labored breathing in triage.

## 2017-02-19 NOTE — Progress Notes (Signed)
Family Meeting Note  Advance Directive:yes  Today a meeting took place with the Patient and daughter in law.  The following clinical team members were present during this meeting:MD  The following were discussed:Patient's diagnosis:CHF, Htn, CKD , Patient's progosis: Unable to determine and Goals for treatment: DNR  Additional follow-up to be provided: PMD  Time spent during discussion:20 minutes  Tressy Kunzman, Heath Gold, MD

## 2017-02-19 NOTE — ED Notes (Signed)
Pt reports that she has been experiencing shortness of breath for the last 3 days with a dry non-productive cough that comes and goes - pt states that she is having off and on chest pain (shooting pain)

## 2017-02-19 NOTE — ED Provider Notes (Signed)
[facility] Emergency Department Provider Note   ____________________________________________    I have reviewed the triage vital signs and the nursing notes.   HISTORY  Chief Complaint Shortness of Breath     HPI Laura [name] is a 83 y.o. female who presents with complaints of shortness of breath.Patient reports over the last several days her breathing has worsened. She reports it is difficult for her to lie flat and breathe well. She does not know what is causing this. She denies fevers or chills. Mild cough. Intermittent chest discomfort which he describes as a pinching sensation. Reportedly her physician adjusted her torsemide dose recently   Past Medical History:  Diagnosis Date  . CHF (congestive heart failure) (HCC)   . High cholesterol   . Hypertension     There are no active problems to display for this patient.   No past surgical history on file.  Prior to Admission medications   Medication Sig Start Date End Date Taking? Authorizing Provider  acetaminophen (TYLENOL) 500 MG tablet Take 1,000 mg by mouth daily. Take 2 tabs ( ) by mouth every morning.  Take 2 tablets in evening if needed.   Yes Historical Provider, MD  aspirin EC 81 MG tablet Take 81 mg by mouth daily.   Yes Historical Provider, MD  carvedilol (COREG) 12.5 MG tablet Take 12.5 mg by mouth 2 (two) times daily. 12/23/16  Yes Historical Provider, MD  docusate sodium (COLACE) 100 MG capsule Take 100 mg by mouth 2 (two) times daily. 01/02/17  Yes Historical Provider, MD  Fe Fum-Vit C-Vit B12-FA (TRIGELS-F FORTE) 460-60-0.01-1 MG CAPS capsule Take 1 tablet by mouth daily.   Yes Historical Provider, MD  Ferrous Gluconate 324 (37.5 Fe) MG TABS Take 1 tablet by mouth 2 (two) times daily. 11/15/16  Yes Historical Provider, MD  fluticasone (FLONASE) 50 MCG/ACT nasal spray Place 2 sprays into both nostrils daily.   Yes Historical Provider, MD  hydrALAZINE (APRESOLINE) 100 MG  tablet Take 100 mg by mouth 2 (two) times daily. 12/23/16  Yes Historical Provider, MD  isosorbide mononitrate (IMDUR) 120 MG 24 hr tablet Take 1 tablet by mouth daily. 12/23/16  Yes Historical Provider, MD  lisinopril (PRINIVIL,ZESTRIL) 10 MG tablet Take 10 mg by mouth daily. 11/15/16  Yes Historical Provider, MD  Lutein 20 MG TABS Take 1 tablet by mouth daily.   Yes Historical Provider, MD  NIFEdipine (PROCARDIA-XL/ADALAT-CC/NIFEDICAL-XL) 30 MG 24 hr tablet Take 30 mg by mouth daily. 12/23/16  Yes Historical Provider, MD  pantoprazole (PROTONIX) 40 MG tablet Take 40 mg by mouth daily. 12/23/16  Yes Historical Provider, MD  simvastatin (ZOCOR) 40 MG tablet Take 40 mg by mouth daily. 12/23/16  Yes Historical Provider, MD  torsemide (DEMADEX) 20 MG tablet Take 20 mg by mouth daily.    Yes Historical Provider, MD  traMADol (ULTRAM) 50 MG tablet Take 50 mg by mouth every 6 (six) hours as needed for pain.   Yes Historical Provider, MD     Allergies Penicillins; Codeine; Amlodipine; Ciprofloxacin; and Sulfa antibiotics  No family history on file.  Social History Social History  Substance Use Topics  . Smoking status: Never Smoker  . Smokeless tobacco: Never Used  . Alcohol use No    Review of Systems  Constitutional: No fever/chills Eyes: No visual changes.  ENT: No sore throat. Cardiovascular: As above Respiratory: As above, significant worse with exertion Gastrointestinal: No abdominal pain.  No nausea, no vomiting.    Musculoskeletal:  Negative for back pain. Skin: Negative for rash. Neurological: Negative for headaches  10-point ROS otherwise negative.  ____________________________________________   PHYSICAL EXAM:  VITAL SIGNS: ED Triage Vitals  Enc Vitals Group     BP 02/19/17 1747 (!) 146/80     Pulse Rate 02/19/17 1747 (!) 54     Resp 02/19/17 1747 (!) 24     Temp 02/19/17 1747 98 F (36.7 C)     Temp Source 02/19/17 1747 Oral     SpO2 02/19/17 1747 97 %     Weight  02/19/17 1748 125 lb (56.7 kg)     Height 02/19/17 1748  (1.448 m)     Head Circumference --      Peak Flow --      Pain Score 02/19/17 1747 0     Pain Loc --      Pain Edu? --      Excl. in GC? --     Constitutional: Alert and oriented. No acute distress. Pleasant and interactive Eyes: Conjunctivae are normal.   Nose: No congestion/rhinnorhea. Mouth/Throat: Mucous membranes are moist.    Cardiovascular: Normal rate, regular rhythm. Grossly normal heart sounds.  Good peripheral circulation. Respiratory: Increased respiratory effort with tachypnea, bibasilar rales, no wheezes Gastrointestinal: Soft and nontender. No distention.  No CVA tenderness. Genitourinary: deferred Musculoskeletal:  Warm and well perfused Neurologic:  Normal speech and language. No gross focal neurologic deficits are appreciated.  Skin:  Skin is warm, dry and intact. No rash noted. Psychiatric: Mood and affect are normal. Speech and behavior are normal.  ____________________________________________   LABS (all labs ordered are listed, but only abnormal results are displayed)  Labs Reviewed  CBC - Abnormal; Notable for the following:       Result Value   RBC 2.49 (*)    Hemoglobin 7.9 (*)    HCT 22.9 (*)    All other components within normal limits  COMPREHENSIVE METABOLIC PANEL - Abnormal; Notable for the following:    Sodium 125 (*)    Chloride 93 (*)    Glucose, Bld 102 (*)    BUN 43 (*)    Creatinine, Ser 2.28 (*)    Calcium 8.7 (*)    Total Protein 5.9 (*)    Albumin 3.2 (*)    ALT 11 (*)    GFR calc non Af Amer 18 (*)    GFR calc Af Amer 21 (*)    All other components within normal limits  TROPONIN I  BRAIN NATRIURETIC PEPTIDE   ____________________________________________  EKG  ED ECG REPORT I, Jene Every, the attending physician, personally viewed and interpreted this ECG.  Date: 02/19/2017 EKG Time: 5:43 PM Rate: 53 Rhythm: normal sinus rhythm QRS Axis:  normal Intervals: First-degree AV block ST/T Wave abnormalities: normal Conduction Disturbances: none   ____________________________________________  RADIOLOGY  Chest x-ray with mild pulmonary edema ____________________________________________   PROCEDURES  Procedure(s) performed: No    Critical Care performed: No ____________________________________________   INITIAL IMPRESSION / ASSESSMENT AND PLAN / ED COURSE  Pertinent labs & imaging results that were available during my care of the patient were reviewed by me and considered in my medical decision making (see chart for details).  Patient presents with shortness of breath with a history of CHF. Bibasilar rales on exam. Chest x-ray confirms mild pulmonary edema. Hemoglobin is 7.9 down from 9.6 several months ago. No reports of rectal bleeding or black stools. Not on blood thinners.   We will treat with  IV Lasix and admitted to the hospital for further management      ____________________________________________   FINAL CLINICAL IMPRESSION(S) / ED DIAGNOSES  Final diagnoses:  Acute on chronic congestive heart failure, unspecified congestive heart failure type (HCC)      NEW MEDICATIONS STARTED DURING THIS VISIT:  New Prescriptions   No medications on file     Note:  This document was prepared using Dragon voice recognition software and may include unintentional dictation errors.    Jene Every, MD 02/19/17 [phone]

## 2017-02-19 NOTE — ED Notes (Signed)
Pt. Reports shortness of breath upon excursion and non-productive cough in the past three days.  Pt. Also reports intermittent chest pain that does not radiate.

## 2017-02-20 ENCOUNTER — Inpatient Hospital Stay
Admit: 2017-02-20 | Discharge: 2017-02-20 | Disposition: A | Discharge status: Home / Self Care | Payer: Medicare Other | Attending: Internal Medicine | Admitting: Internal Medicine

## 2017-02-20 ENCOUNTER — Inpatient Hospital Stay: Payer: Medicare Other

## 2017-02-20 LAB — URINALYSIS, COMPLETE (UACMP) WITH MICROSCOPIC
BILIRUBIN URINE: NEGATIVE
Bacteria, UA: NONE SEEN
GLUCOSE, UA: NEGATIVE mg/dL
Hgb urine dipstick: NEGATIVE
KETONES UR: NEGATIVE mg/dL
LEUKOCYTES UA: NEGATIVE
NITRITE: NEGATIVE
PH: 5 (ref 5.0–8.0)
Protein, ur: 100 mg/dL — AB
RBC / HPF: NONE SEEN RBC/hpf (ref 0–5)
Specific Gravity, Urine: 1.006 (ref 1.005–1.030)

## 2017-02-20 LAB — ECHOCARDIOGRAM COMPLETE
HEIGHTINCHES: 57 in
Weight: 2051.2 oz

## 2017-02-20 LAB — BASIC METABOLIC PANEL
ANION GAP: 8 (ref 5–15)
BUN: 43 mg/dL — ABNORMAL HIGH (ref 6–20)
CO2: 23 mmol/L (ref 22–32)
Calcium: 8.7 mg/dL — ABNORMAL LOW (ref 8.9–10.3)
Chloride: 94 mmol/L — ABNORMAL LOW (ref 101–111)
Creatinine, Ser: 2.25 mg/dL — ABNORMAL HIGH (ref 0.44–1.00)
GFR, EST AFRICAN AMERICAN: 21 mL/min — AB (ref >60)
GFR, EST NON AFRICAN AMERICAN: 18 mL/min — AB (ref >60)
GLUCOSE: 98 mg/dL (ref 65–99)
POTASSIUM: 4 mmol/L (ref 3.5–5.1)
Sodium: 125 mmol/L — ABNORMAL LOW (ref 135–145)

## 2017-02-20 LAB — CBC
HEMATOCRIT: 21.3 % — AB (ref 35.0–47.0)
HEMOGLOBIN: 7.4 g/dL — AB (ref 12.0–16.0)
MCH: 31.7 pg (ref 26.0–34.0)
MCHC: 34.6 g/dL (ref 32.0–36.0)
MCV: 91.6 fL (ref 80.0–100.0)
Platelets: 207 10*3/uL (ref 150–440)
RBC: 2.32 MIL/uL — AB (ref 3.80–5.20)
RDW: 12.5 % (ref 11.5–14.5)
WBC: 4.9 10*3/uL (ref 3.6–11.0)

## 2017-02-20 LAB — TROPONIN I
TROPONIN I: 0.03 ng/mL — AB (ref <0.03)
TROPONIN I: 0.03 ng/mL — AB (ref <0.03)
Troponin I: 0.03 ng/mL (ref <0.03)

## 2017-02-20 LAB — MRSA PCR SCREENING: MRSA BY PCR: NEGATIVE

## 2017-02-20 MED ORDER — IPRATROPIUM-ALBUTEROL 0.5-2.5 (3) MG/3ML IN SOLN
3.0000 mL | RESPIRATORY_TRACT | Status: DC
  Administered 2017-02-20 (×3): 3 mL via RESPIRATORY_TRACT
  Filled 2017-02-20 (×2): qty 3

## 2017-02-20 MED ORDER — IPRATROPIUM-ALBUTEROL 0.5-2.5 (3) MG/3ML IN SOLN
3.0000 mL | Freq: Four times a day (QID) | RESPIRATORY_TRACT | Status: DC | PRN
  Administered 2017-02-20: 3 mL via RESPIRATORY_TRACT
  Filled 2017-02-20 (×4): qty 3

## 2017-02-20 MED ORDER — IPRATROPIUM-ALBUTEROL 0.5-2.5 (3) MG/3ML IN SOLN
3.0000 mL | RESPIRATORY_TRACT | Status: DC | PRN
  Administered 2017-02-21: 3 mL via RESPIRATORY_TRACT
  Filled 2017-02-20: qty 3

## 2017-02-20 NOTE — Progress Notes (Signed)
[facility] - [place] at [facility]   PATIENT NAME: Laura [name]    MR#:  [id number]  DATE OF BIRTH:  February 07, 1924  SUBJECTIVE: he  is seen at bedside, admitted for CHF exacerbation, hyponatremia. Today morning patient had shortness of breath and expiratory wheeze which relieved with nebulizer treatment given that time.   CHIEF COMPLAINT:   Chief Complaint  Patient presents with  . Shortness of Breath    REVIEW OF SYSTEMS:    Review of Systems  Constitutional: Negative for chills and fever.  HENT: Negative for hearing loss.   Eyes: Negative for blurred vision, double vision and photophobia.  Respiratory: Positive for shortness of breath. Negative for cough and hemoptysis.   Cardiovascular: Positive for leg swelling. Negative for chest pain, palpitations and orthopnea.  Gastrointestinal: Negative for abdominal pain, diarrhea and vomiting.  Genitourinary: Negative for dysuria and urgency.  Musculoskeletal: Negative for myalgias and neck pain.  Skin: Negative for rash.  Neurological: Negative for dizziness, focal weakness, seizures, weakness and headaches.  Psychiatric/Behavioral: Negative for memory loss. The patient does not have insomnia.     Nutrition:  Tolerating Diet: Tolerating PT:      DRUG ALLERGIES:   Allergies  Allergen Reactions  . Penicillins Shortness Of Breath        . Codeine Itching  . Amlodipine Rash    Peripheral edema  . Ciprofloxacin Rash  . Sulfa Antibiotics Rash    VITALS:  Blood pressure (!) 160/81, pulse (!) 54, temperature 97.5 F (36.4 C), temperature source Oral, resp. rate 20, height  (1.448 m), weight 58.2 kg (128 lb 3.2 oz), SpO2 99 %.  PHYSICAL EXAMINATION:   Physical Exam  GENERAL:  83 y.o.-year-old patient lying in the bed with no acute distress.  EYES: Pupils equal, round, reactive to light and accommodation. No scleral icterus. Extraocular muscles intact.  HEENT: Head atraumatic,  normocephalic. Oropharynx and nasopharynx clear.  NECK:  Supple, no jugular venous distention. No thyroid enlargement, no tenderness.  LUNGS: Bilateral basilar crepitations, faint expiratory wheeze in the right lung field, No use of accessory muscles of respiration.  CARDIOVASCULAR: S1, S2 normal. No murmurs, rubs, or gallops.  ABDOMEN: Soft, nontender, nondistended. Bowel sounds present. No organomegaly or mass.  EXTREMITIES: 1+ pitting edema bilaterally .cyanosis, or clubbing.  NEUROLOGIC: Cranial nerves II through XII are intact. Muscle strength 5/5 in all extremities. Sensation intact. Gait not checked.  PSYCHIATRIC: The patient is alert and oriented x 3.  SKIN: No obvious rash, lesion, or ulcer.    LABORATORY PANEL:   CBC  Recent Labs Lab 02/20/17 0356  WBC 4.9  HGB 7.4*  HCT 21.3*  PLT 207   ------------------------------------------------------------------------------------------------------------------  Chemistries   Recent Labs Lab 02/19/17 1907 02/20/17 0356  NA 125* 125*  K 4.3 4.0  CL 93* 94*  CO2 23 23  GLUCOSE 102* 98  BUN 43* 43*  CREATININE 2.28* 2.25*  CALCIUM 8.7* 8.7*  AST 18  --   ALT 11*  --   ALKPHOS 62  --   BILITOT 0.5  --    ------------------------------------------------------------------------------------------------------------------  Cardiac Enzymes  Recent Labs Lab 02/20/17 0819  TROPONINI 0.03*   ------------------------------------------------------------------------------------------------------------------  RADIOLOGY:  Dg Chest 2 View  Result Date: 02/20/2017 CLINICAL DATA:  Shortness of Breath EXAM: CHEST  2 VIEW COMPARISON:  February 19, 2017 FINDINGS: There is interstitial edema with a small right pleural effusion. There is atelectatic change in the right lower lung zone. There is cardiomegaly with pulmonary  venous hypertension. Aorta is tortuous with atherosclerotic calcification. No adenopathy. No bone lesions. IMPRESSION:  Congestive heart failure. No airspace consolidation. There is aortic atherosclerosis. Electronically Signed   By: Bretta Bang III M.D.   On: 02/20/2017 07:23   Dg Chest 2 View  Result Date: 02/19/2017 CLINICAL DATA:  Dyspnea EXAM: CHEST  2 VIEW COMPARISON:  01/09/2017 chest radiograph. FINDINGS: Stable cardiomediastinal silhouette with mild cardiomegaly and tortuous atherosclerotic thoracic aorta. No pneumothorax. Trace bilateral pleural effusions. Hyperinflated lungs. Mild pulmonary edema. Mild left basilar scarring versus atelectasis. IMPRESSION: 1. Mild congestive heart failure. 2. Trace bilateral pleural effusions. 3. Mild scarring versus atelectasis at the left lung base. 4. Hyperinflated lungs, suggesting underlying obstructive lung disease. 5. Aortic atherosclerosis. Electronically Signed   By: Delbert Phenix M.D.   On: 02/19/2017 18:29     ASSESSMENT AND PLAN:   Principal Problem:   Acute CHF (HCC) Active Problems:   CHF (congestive heart failure) (HCC)   Acute congestive heart failure; unknown systolic or diastolic: Check echocardiogram. Continue IV Lasix. Continue fluid restriction. #2 hyponatremia likely secondary to CHF: Continue Lasix, if it doesn't get better consult nephrology for Tolvaptan. #3 .hyperlipidemia continue statins. #4 . Chronic  kidney disease stage III: Stable #5,Acute respiratory failure with hypoxia due to chf date: Continue IV Lasix, oxygen. L[place] CODE STATUS is DO NOT RESUSCITATE Discussed with patient's family. 6 history of paroxysmal atrial fibrillation, chronic hyponatremia,baseline BMp;130    All the records are reviewed and case discussed with Care Management/Social Workerr. Management plans discussed with the patient, family and they are in agreement.  CODE STATUS: DNR  TOTAL TIME TAKING CARE OF THIS PATIENT: 35 minutes.   POSSIBLE D/C IN 1-2 DAYS, DEPENDING ON CLINICAL CONDITION.   Katha Hamming M.D on 02/20/2017 at 1:13 PM  Between  7am to 6pm - Pager - [phone]  After 6pm go to [email or web address] - password [email or web address] [facility]  [facility]  Office  [phone]  CC: Primary care physician; Marisue Ivan, MD

## 2017-02-20 NOTE — Progress Notes (Signed)
Chaplain visited patient while rounding around the unit. Patient said that she was tired and she needed to take a nap. Chaplain offered a short prayer and left the patient alone.   02/20/17 1500  Clinical Encounter Type  Visited With Patient  Visit Type Initial  Spiritual Encounters  Spiritual Needs Prayer

## 2017-02-20 NOTE — Progress Notes (Signed)
Patient very SOB, expiratory weezing, DR Luberta Mutter notified, order received, family at bedside will continue to monitor

## 2017-02-20 NOTE — Progress Notes (Signed)
*  PRELIMINARY RESULTS* Echocardiogram 2D Echocardiogram has been performed.  Cristela Blue 02/20/2017, 4:10 PM

## 2017-02-21 LAB — BASIC METABOLIC PANEL
ANION GAP: 9 (ref 5–15)
BUN: 41 mg/dL — ABNORMAL HIGH (ref 6–20)
CALCIUM: 8.1 mg/dL — AB (ref 8.9–10.3)
CHLORIDE: 87 mmol/L — AB (ref 101–111)
CO2: 24 mmol/L (ref 22–32)
Creatinine, Ser: 2.04 mg/dL — ABNORMAL HIGH (ref 0.44–1.00)
GFR calc non Af Amer: 20 mL/min — ABNORMAL LOW (ref >60)
GFR, EST AFRICAN AMERICAN: 24 mL/min — AB (ref >60)
GLUCOSE: 103 mg/dL — AB (ref 65–99)
Potassium: 3.7 mmol/L (ref 3.5–5.1)
Sodium: 120 mmol/L — ABNORMAL LOW (ref 135–145)

## 2017-02-21 MED ORDER — METHYLPREDNISOLONE SODIUM SUCC 125 MG IJ SOLR
60.0000 mg | INTRAMUSCULAR | Status: DC
  Administered 2017-02-21: 60 mg via INTRAVENOUS
  Filled 2017-02-21: qty 2

## 2017-02-21 MED ORDER — SODIUM CHLORIDE 0.9% FLUSH
3.0000 mL | Freq: Two times a day (BID) | INTRAVENOUS | Status: DC
  Administered 2017-02-21 – 2017-02-27 (×11): 3 mL via INTRAVENOUS

## 2017-02-21 NOTE — Discharge Instructions (Signed)
Heart Failure Clinic appointment on March 02, 2017 at 12:40pm with Clarisa Kindred, FNP. Please call [phone] to reschedule.

## 2017-02-21 NOTE — Progress Notes (Signed)
[facility] - [place] at [facility]   PATIENT NAME: Laura [name]    MR#:  [id number]  DATE OF BIRTH:  Jun 26, 1924  SUBJECTIVE: Has minimal shortness of breath but has a lot of wheezing. Labs pending .  Out of breath with minimal activity.   CHIEF COMPLAINT:   Chief Complaint  Patient presents with  . Shortness of Breath    REVIEW OF SYSTEMS:    Review of Systems  Constitutional: Negative for chills and fever.  HENT: Negative for hearing loss.   Eyes: Negative for blurred vision, double vision and photophobia.  Respiratory: Positive for shortness of breath. Negative for cough and hemoptysis.   Cardiovascular: Positive for leg swelling. Negative for chest pain, palpitations and orthopnea.  Gastrointestinal: Negative for abdominal pain, diarrhea and vomiting.  Genitourinary: Negative for dysuria and urgency.  Musculoskeletal: Negative for myalgias and neck pain.  Skin: Negative for rash.  Neurological: Negative for dizziness, focal weakness, seizures, weakness and headaches.  Psychiatric/Behavioral: Negative for memory loss. The patient does not have insomnia.     Nutrition:  Tolerating Diet: Tolerating PT:      DRUG ALLERGIES:   Allergies  Allergen Reactions  . Penicillins Shortness Of Breath        . Codeine Itching  . Amlodipine Rash    Peripheral edema  . Ciprofloxacin Rash  . Sulfa Antibiotics Rash    VITALS:  Blood pressure (!) 125/50, pulse (!) 51, temperature 97.7 F (36.5 C), temperature source Oral, resp. rate 18, height  (1.448 m), weight 57.3 kg (126 lb 6.4 oz), SpO2 99 %.  PHYSICAL EXAMINATION:   Physical Exam  GENERAL:  83 y.o.-year-old patient lying in the bed with no acute distress.  EYES: Pupils equal, round, reactive to light and accommodation. No scleral icterus. Extraocular muscles intact.  HEENT: Head atraumatic, normocephalic. Oropharynx and nasopharynx clear.  NECK:  Supple, no jugular venous distention.  No thyroid enlargement, no tenderness.  LUNGS: Bilateral basilar crepitations, faint expiratory wheeze in the right lung field, No use of accessory muscles of respiration.  CARDIOVASCULAR: S1, S2 normal. No murmurs, rubs, or gallops.  ABDOMEN: Soft, nontender, nondistended. Bowel sounds present. No organomegaly or mass.  EXTREMITIES: 1+ pitting edema bilaterally .cyanosis, or clubbing.  NEUROLOGIC: Cranial nerves II through XII are intact. Muscle strength 5/5 in all extremities. Sensation intact. Gait not checked.  PSYCHIATRIC: The patient is alert and oriented x 3.  SKIN: No obvious rash, lesion, or ulcer.    LABORATORY PANEL:   CBC  Recent Labs Lab 02/20/17 0356  WBC 4.9  HGB 7.4*  HCT 21.3*  PLT 207   ------------------------------------------------------------------------------------------------------------------  Chemistries   Recent Labs Lab 02/19/17 1907 02/20/17 0356  NA 125* 125*  K 4.3 4.0  CL 93* 94*  CO2 23 23  GLUCOSE 102* 98  BUN 43* 43*  CREATININE 2.28* 2.25*  CALCIUM 8.7* 8.7*  AST 18  --   ALT 11*  --   ALKPHOS 62  --   BILITOT 0.5  --    ------------------------------------------------------------------------------------------------------------------  Cardiac Enzymes  Recent Labs Lab 02/20/17 0819  TROPONINI 0.03*   ------------------------------------------------------------------------------------------------------------------  RADIOLOGY:  Dg Chest 2 View  Result Date: 02/20/2017 CLINICAL DATA:  Shortness of Breath EXAM: CHEST  2 VIEW COMPARISON:  February 19, 2017 FINDINGS: There is interstitial edema with a small right pleural effusion. There is atelectatic change in the right lower lung zone. There is cardiomegaly with pulmonary venous hypertension. Aorta is tortuous with atherosclerotic calcification.  No adenopathy. No bone lesions. IMPRESSION: Congestive heart failure. No airspace consolidation. There is aortic atherosclerosis.  Electronically Signed   By: Bretta Bang III M.D.   On: 02/20/2017 07:23   Dg Chest 2 View  Result Date: 02/19/2017 CLINICAL DATA:  Dyspnea EXAM: CHEST  2 VIEW COMPARISON:  01/09/2017 chest radiograph. FINDINGS: Stable cardiomediastinal silhouette with mild cardiomegaly and tortuous atherosclerotic thoracic aorta. No pneumothorax. Trace bilateral pleural effusions. Hyperinflated lungs. Mild pulmonary edema. Mild left basilar scarring versus atelectasis. IMPRESSION: 1. Mild congestive heart failure. 2. Trace bilateral pleural effusions. 3. Mild scarring versus atelectasis at the left lung base. 4. Hyperinflated lungs, suggesting underlying obstructive lung disease. 5. Aortic atherosclerosis. Electronically Signed   By: Delbert Phenix M.D.   On: 02/19/2017 18:29     ASSESSMENT AND PLAN:   Principal Problem:   Acute CHF (HCC) Active Problems:   CHF (congestive heart failure) (HCC)   Acute congestive heart failure; unknown systolic or diastolic: Check echocardiogram. Continue IV Lasix. Continue fluid restriction., Check  BMP today. #2 hyponatremia likely secondary to CHF: Continue Lasix, if it doesn't get better consult nephrology for Tolvaptan. #3 .hyperlipidemia continue statins. #4 . Chronic  kidney disease stage III: Stable #5,Acute respiratory failure with hypoxia due to chf date: Continue IV Lasix, oxygen. Reactive airway disease with wheezing: Started on now steroids today.  CODE STATUS is DO NOT RESUSCITATE Discussed with patient's family. 6 history of paroxysmal atrial fibrillation, chronic hyponatremia,baseline BMp;130    All the records are reviewed and case discussed with Care Management/Social Workerr. Management plans discussed with the patient, family and they are in agreement.  CODE STATUS: DNR  TOTAL TIME TAKING CARE OF THIS PATIENT: 35 minutes.   POSSIBLE D/C IN 1-2 DAYS, DEPENDING ON CLINICAL CONDITION.   Katha Hamming M.D on 02/21/2017 at 3:01 PM  Between  7am to 6pm - Pager - [phone]  After 6pm go to [email or web address] - password [email or web address] [facility]  [facility] [place]  Office  [phone]  CC: Primary care physician; Marisue Ivan, MD

## 2017-02-21 NOTE — Care Management (Addendum)
Patient resides in the [facility].  At baseline, says she is able to ambulate and perform her own personal care by herself.  At present, with supplemental 02, she is dyspneic with conversation with marked increase with minimal activity when CM assisted her to [facility]. She has access to scales and says she does weigh daily.  If needed, she would consider short term skilled nursing place / or home health- which ever best meets her discharge needs.  She says she is current with her pcp- Dr Burnadette Pop. Gives CM permission to speak with her daughter. HF referral made

## 2017-02-21 NOTE — Plan of Care (Signed)
Problem: Fluid Volume: Goal: Ability to maintain a balanced intake and output will improve Outcome: Progressing Continues to diurese but dyspnea noted with minimal exertion.

## 2017-02-21 NOTE — Plan of Care (Signed)
Problem: Physical Regulation: Goal: Ability to maintain clinical measurements within normal limits will improve Outcome: Not Progressing HR >140 (ST) with activity to bedside commode after IV Lasix given this AM.  SR maintained on return to bed.

## 2017-02-22 ENCOUNTER — Inpatient Hospital Stay: Payer: Medicare Other

## 2017-02-22 LAB — BASIC METABOLIC PANEL
Anion gap: 10 (ref 5–15)
BUN: 43 mg/dL — AB (ref 6–20)
CO2: 25 mmol/L (ref 22–32)
CREATININE: 1.95 mg/dL — AB (ref 0.44–1.00)
Calcium: 8.4 mg/dL — ABNORMAL LOW (ref 8.9–10.3)
Chloride: 90 mmol/L — ABNORMAL LOW (ref 101–111)
GFR calc Af Amer: 25 mL/min — ABNORMAL LOW (ref >60)
GFR, EST NON AFRICAN AMERICAN: 21 mL/min — AB (ref >60)
Glucose, Bld: 99 mg/dL (ref 65–99)
Potassium: 3.8 mmol/L (ref 3.5–5.1)
SODIUM: 125 mmol/L — AB (ref 135–145)

## 2017-02-22 LAB — HEMOGLOBIN AND HEMATOCRIT, BLOOD
HEMATOCRIT: 22.8 % — AB (ref 35.0–47.0)
Hemoglobin: 7.8 g/dL — ABNORMAL LOW (ref 12.0–16.0)

## 2017-02-22 MED ORDER — LORATADINE 10 MG PO TABS
10.0000 mg | ORAL_TABLET | Freq: Every day | ORAL | Status: DC
  Administered 2017-02-22 – 2017-02-27 (×6): 10 mg via ORAL
  Filled 2017-02-22 (×7): qty 1

## 2017-02-22 MED ORDER — IPRATROPIUM-ALBUTEROL 0.5-2.5 (3) MG/3ML IN SOLN
3.0000 mL | RESPIRATORY_TRACT | Status: DC
  Administered 2017-02-22: 3 mL via RESPIRATORY_TRACT
  Filled 2017-02-22: qty 3

## 2017-02-22 MED ORDER — METHYLPREDNISOLONE SODIUM SUCC 125 MG IJ SOLR
60.0000 mg | Freq: Two times a day (BID) | INTRAMUSCULAR | Status: DC
  Administered 2017-02-22 – 2017-02-23 (×2): 60 mg via INTRAVENOUS
  Filled 2017-02-22 (×2): qty 2

## 2017-02-22 MED ORDER — IPRATROPIUM-ALBUTEROL 0.5-2.5 (3) MG/3ML IN SOLN
3.0000 mL | Freq: Three times a day (TID) | RESPIRATORY_TRACT | Status: DC
  Administered 2017-02-22 – 2017-02-23 (×2): 3 mL via RESPIRATORY_TRACT
  Filled 2017-02-22 (×3): qty 3

## 2017-02-22 MED ORDER — ONDANSETRON HCL 4 MG/2ML IJ SOLN
4.0000 mg | Freq: Four times a day (QID) | INTRAMUSCULAR | Status: DC | PRN
  Administered 2017-02-22: 4 mg via INTRAVENOUS
  Filled 2017-02-22: qty 2

## 2017-02-22 MED ORDER — ONDANSETRON HCL 4 MG/2ML IJ SOLN
INTRAMUSCULAR | Status: AC
  Administered 2017-02-22: 14:00:00
  Filled 2017-02-22: qty 2

## 2017-02-22 NOTE — Plan of Care (Signed)
Problem: Safety: Goal: Ability to remain free from injury will improve Outcome: Progressing Patients exit alarm is activated. Patient is encouraged to call for assistance with activity. Pt is one assist to bedside commode.

## 2017-02-22 NOTE — Progress Notes (Signed)
[facility] - [place] at [facility]   PATIENT NAME: Laura [name]    MR#:  [id number]  DATE OF BIRTH:  08/12/1924  SUBJECTIVE:  Audible wheezing. Chest x-ray showed improving pulmonary edema, sodium 125. Decreased  leg edema.   CHIEF COMPLAINT:   Chief Complaint  Patient presents with  . Shortness of Breath    REVIEW OF SYSTEMS:    Review of Systems  Constitutional: Negative for chills and fever.  HENT: Negative for hearing loss.   Eyes: Negative for blurred vision, double vision and photophobia.  Respiratory: Positive for shortness of breath. Negative for cough and hemoptysis.   Cardiovascular: Negative for chest pain, palpitations and orthopnea.  Gastrointestinal: Negative for abdominal pain, diarrhea and vomiting.  Genitourinary: Negative for dysuria and urgency.  Musculoskeletal: Negative for myalgias and neck pain.  Skin: Negative for rash.  Neurological: Negative for dizziness, focal weakness, seizures, weakness and headaches.  Psychiatric/Behavioral: Negative for memory loss. The patient does not have insomnia.     Nutrition:  Tolerating Diet: Tolerating PT:      DRUG ALLERGIES:   Allergies  Allergen Reactions  . Penicillins Shortness Of Breath        . Codeine Itching  . Amlodipine Rash    Peripheral edema  . Ciprofloxacin Rash  . Sulfa Antibiotics Rash    VITALS:  Blood pressure 128/80, pulse (!) 112, temperature 97.9 F (36.6 C), resp. rate 16, height  (1.448 m), weight 57.2 kg (126 lb 1.6 oz), SpO2 98 %.  PHYSICAL EXAMINATION:   Physical Exam  GENERAL:  83 y.o.-year-old patient lying in the bed with no acute distress.  EYES: Pupils equal, round, reactive to light and accommodation. No scleral icterus. Extraocular muscles intact.  HEENT: Head atraumatic, normocephalic. Oropharynx and nasopharynx clear.  NECK:  Supple, no jugular venous distention. No thyroid enlargement, no tenderness.  LUNGS: Expiratory wheeze  in all lung fields.  CARDIOVASCULAR: S1, S2 normal. No murmurs, rubs, or gallops.  ABDOMEN: Soft, nontender, nondistended. Bowel sounds present. No organomegaly or mass.  EXTREMITIES: 1+ pitting edema bilaterally .cyanosis, or clubbing.  NEUROLOGIC: Cranial nerves II through XII are intact. Muscle strength 5/5 in all extremities. Sensation intact. Gait not checked.  PSYCHIATRIC: The patient is alert and oriented x 3.  SKIN: No obvious rash, lesion, or ulcer.    LABORATORY PANEL:   CBC  Recent Labs Lab 02/20/17 0356 02/22/17 1207  WBC 4.9  --   HGB 7.4* 7.8*  HCT 21.3* 22.8*  PLT 207  --    ------------------------------------------------------------------------------------------------------------------  Chemistries   Recent Labs Lab 02/19/17 1907  02/22/17 1207  NA 125*  < > 125*  K 4.3  < > 3.8  CL 93*  < > 90*  CO2 23  < > 25  GLUCOSE 102*  < > 99  BUN 43*  < > 43*  CREATININE 2.28*  < > 1.95*  CALCIUM 8.7*  < > 8.4*  AST 18  --   --   ALT 11*  --   --   ALKPHOS 62  --   --   BILITOT 0.5  --   --   < > = values in this interval not displayed. ------------------------------------------------------------------------------------------------------------------  Cardiac Enzymes  Recent Labs Lab 02/20/17 0819  TROPONINI 0.03*   ------------------------------------------------------------------------------------------------------------------  RADIOLOGY:  Dg Chest 1 View  Result Date: 02/22/2017 CLINICAL DATA:  Recent congestive heart failure EXAM: CHEST 1 VIEW COMPARISON:  February 20, 2017 FINDINGS: There is somewhat less  interstitial edema compared to recent study. A small amount of residual interstitial edema remains, most notably in the left base. There is cardiomegaly with mild pulmonary venous hypertension. No consolidation. There is aortic atherosclerosis. No adenopathy. Bones are osteoporotic. IMPRESSION: Less interstitial edema compared to recent study. Mild  edema remains, primarily in the left base. No consolidation. There is persistent cardiomegaly with mild pulmonary venous hypertension. There is aortic atherosclerosis. Bones are osteoporotic. Electronically Signed   By: Bretta Bang III M.D.   On: 02/22/2017 12:10     ASSESSMENT AND PLAN:   Principal Problem:   Acute CHF (HCC) Active Problems:   CHF (congestive heart failure) (HCC)   Acute congestive heart failure;Acute on chronic diastolic heart failure: Lasix on hold because of hyponatremia, nephrology consulted due to hyponatremia and CHF  And tolvaptan   #2 hyponatremia likely secondary to CHF  : C#3 .hyperlipidemia continue statins. #4 . Chronic  kidney disease stage III: Stable  #5,Acute respiratory failure with hypoxia due to chf : Continue oxygen 2 L by nasal cannula and keep sats more than 90%.  #6 COPD exacerbation: Continue nebulizers every 4 hours, continue IV steroids, changed  dose . Discussed with patient's son who is a Teacher, early years/pre.  7.PT consult;son requesting SNF.Marland Kitchen  CODE STATUS is DO NOT RESUSCITATE Discussed with patient's family. 6 history of paroxysmal atrial fibrillation, chronic hyponatremia,baseline BMp;130    All the records are reviewed and case discussed with Care Management/Social Workerr. Management plans discussed with the patient, family and they are in agreement.  CODE STATUS: DNR  TOTAL TIME TAKING CARE OF THIS PATIENT: 35 minutes.   POSSIBLE D/C IN 1-2 DAYS, DEPENDING ON CLINICAL CONDITION.   Katha Hamming M.D on 02/22/2017 at 1:09 PM  Between 7am to 6pm - Pager - [phone]  After 6pm go to [email or web address] - password [email or web address] [facility]  [place] [facility]  Office  [phone]  CC: Primary care physician; Marisue Ivan, MD

## 2017-02-23 LAB — BASIC METABOLIC PANEL
ANION GAP: 9 (ref 5–15)
BUN: 45 mg/dL — ABNORMAL HIGH (ref 6–20)
CALCIUM: 8.6 mg/dL — AB (ref 8.9–10.3)
CO2: 27 mmol/L (ref 22–32)
CREATININE: 2.14 mg/dL — AB (ref 0.44–1.00)
Chloride: 89 mmol/L — ABNORMAL LOW (ref 101–111)
GFR calc Af Amer: 22 mL/min — ABNORMAL LOW (ref >60)
GFR, EST NON AFRICAN AMERICAN: 19 mL/min — AB (ref >60)
GLUCOSE: 111 mg/dL — AB (ref 65–99)
Potassium: 4.3 mmol/L (ref 3.5–5.1)
Sodium: 125 mmol/L — ABNORMAL LOW (ref 135–145)

## 2017-02-23 LAB — CBC
HEMATOCRIT: 24.6 % — AB (ref 35.0–47.0)
Hemoglobin: 8.5 g/dL — ABNORMAL LOW (ref 12.0–16.0)
MCH: 31.3 pg (ref 26.0–34.0)
MCHC: 34.5 g/dL (ref 32.0–36.0)
MCV: 90.8 fL (ref 80.0–100.0)
PLATELETS: 249 10*3/uL (ref 150–440)
RBC: 2.71 MIL/uL — ABNORMAL LOW (ref 3.80–5.20)
RDW: 12.7 % (ref 11.5–14.5)
WBC: 3.6 10*3/uL (ref 3.6–11.0)

## 2017-02-23 LAB — SODIUM: Sodium: 122 mmol/L — ABNORMAL LOW (ref 135–145)

## 2017-02-23 MED ORDER — PREDNISONE 20 MG PO TABS
20.0000 mg | ORAL_TABLET | Freq: Every day | ORAL | Status: DC
  Administered 2017-02-24 – 2017-02-25 (×2): 20 mg via ORAL
  Filled 2017-02-23 (×3): qty 1

## 2017-02-23 MED ORDER — TOLVAPTAN 15 MG PO TABS
15.0000 mg | ORAL_TABLET | ORAL | Status: DC
  Administered 2017-02-23 – 2017-02-24 (×2): 15 mg via ORAL
  Filled 2017-02-23 (×2): qty 1

## 2017-02-23 MED ORDER — ALBUTEROL SULFATE (2.5 MG/3ML) 0.083% IN NEBU: 2.5000 mg | INHALATION_SOLUTION | RESPIRATORY_TRACT | Status: DC | PRN

## 2017-02-23 NOTE — Progress Notes (Signed)
A&O. HOH. Up with heavy assist. Dyspneic with exertion. IV solumedrol given.  Weaned O2 down to RA.  Sats in mid to low 90's.

## 2017-02-23 NOTE — Care Management (Signed)
Received order for PT

## 2017-02-23 NOTE — Consult Note (Signed)
[facility] Card[facility] Consultation Note  Patient ID: Laura [name], MRN: [id number], DOB/AGE: 1924/10/13 83 y.o. Admit date: 02/19/2017   Date of Consult: 02/23/2017 Primary Physician: Marisue Ivan, MD Primary Cardiologist: [facility]  Chief Complaint:  Chief Complaint  Patient presents with  . Shortness of Breath   Reason for Consult: atrial fibrillation and congestive heart failure  HPI: 83 y.o. female with paroxysmal nonvalvular atrial fibrillation essential hypertension mixed hyperlipidemia with the chronic kidney disease stage IV having acute on chronic Systolic dysfunction congestive heart failure and pulmonary disease with hypoxia causing atrial fibrillation with rapid ventricular rate now slightly improved after diuresis and improvements of the pulmonary edema and hypoxia. The patient has previously been on appropriate medication management for cardiomyopathy as well as of treatment of hypertension control with carvedilol. Hydralazine and isosorbide as well as ACE inhibitor have been used for hypertension control stable. She does have high cholesterol for which she is continued to be on simvastatin stable. She is no current evidence of myocardial infarction with a troponin level of 0.03 more consistent with demand ischemia rather than acute coronary syndrome. The patient's heart rate is much better controlled and she feels much better with less shortness of breath after above treatment.  Past Medical History:  Diagnosis Date  . CHF (congestive heart failure) (HCC)   . Chronic kidney disease   . High cholesterol   . Hypertension       Surgical History: History reviewed. No pertinent surgical history.   Home Meds: Prior to Admission medications   Medication Sig Start Date End Date Taking? Authorizing Provider  acetaminophen (TYLENOL) 500 MG tablet Take 1,000 mg by mouth daily. Take 2 tabs ( ) by mouth every morning.  Take 2 tablets in evening if needed.   Yes  Historical Provider, MD  aspirin EC 81 MG tablet Take 81 mg by mouth daily.   Yes Historical Provider, MD  carvedilol (COREG) 12.5 MG tablet Take 12.5 mg by mouth 2 (two) times daily. 12/23/16  Yes Historical Provider, MD  docusate sodium (COLACE) 100 MG capsule Take 100 mg by mouth 2 (two) times daily. 01/02/17  Yes Historical Provider, MD  Fe Fum-Vit C-Vit B12-FA (TRIGELS-F FORTE) 460-60-0.01-1 MG CAPS capsule Take 1 tablet by mouth daily.   Yes Historical Provider, MD  Ferrous Gluconate 324 (37.5 Fe) MG TABS Take 1 tablet by mouth 2 (two) times daily. 11/15/16  Yes Historical Provider, MD  fluticasone (FLONASE) 50 MCG/ACT nasal spray Place 2 sprays into both nostrils daily.   Yes Historical Provider, MD  hydrALAZINE (APRESOLINE) 100 MG tablet Take 100 mg by mouth 2 (two) times daily. 12/23/16  Yes Historical Provider, MD  isosorbide mononitrate (IMDUR) 120 MG 24 hr tablet Take 1 tablet by mouth daily. 12/23/16  Yes Historical Provider, MD  lisinopril (PRINIVIL,ZESTRIL) 10 MG tablet Take 10 mg by mouth daily. 11/15/16  Yes Historical Provider, MD  Lutein 20 MG TABS Take 1 tablet by mouth daily.   Yes Historical Provider, MD  NIFEdipine (PROCARDIA-XL/ADALAT-CC/NIFEDICAL-XL) 30 MG 24 hr tablet Take 30 mg by mouth daily. 12/23/16  Yes Historical Provider, MD  pantoprazole (PROTONIX) 40 MG tablet Take 40 mg by mouth daily. 12/23/16  Yes Historical Provider, MD  simvastatin (ZOCOR) 40 MG tablet Take 40 mg by mouth daily. 12/23/16  Yes Historical Provider, MD  torsemide (DEMADEX) 20 MG tablet Take 20 mg by mouth daily.    Yes Historical Provider, MD  traMADol (ULTRAM) 50 MG tablet Take 50 mg by mouth every  6 (six) hours as needed for pain.   Yes Historical Provider, MD    Inpatient Medications:  . acetaminophen  1,000 mg Oral Daily  . aspirin EC  81 mg Oral Daily  . carvedilol  12.5 mg Oral BID  . docusate sodium  100 mg Oral BID  . Ferrous Gluconate  1 tablet Oral BID  . fluticasone  2 spray Each Nare Daily   . heparin  5,000 Units Subcutaneous Q8H  . hydrALAZINE  100 mg Oral BID  . isosorbide mononitrate  120 mg Oral Daily  . lisinopril  10 mg Oral Daily  . loratadine  10 mg Oral Daily  . mouth rinse  15 mL Mouth Rinse BID  . multivitamin-lutein  1 capsule Oral Daily  . NIFEdipine  30 mg Oral Daily  . pantoprazole  40 mg Oral Daily  . [START ON 02/24/2017] predniSONE  20 mg Oral Q breakfast  . simvastatin  40 mg Oral Daily  . sodium chloride flush  3 mL Intravenous Q12H  . tolvaptan  15 mg Oral Q24H     Allergies:  Allergies  Allergen Reactions  . Penicillins Shortness Of Breath        . Codeine Itching  . Amlodipine Rash    Peripheral edema  . Ciprofloxacin Rash  . Sulfa Antibiotics Rash    Social History   Social History  . Marital status: Widowed    Spouse name: N/A  . Number of children: N/A  . Years of education: N/A   Occupational History  . Not on file.   Social History Main Topics  . Smoking status: Never Smoker  . Smokeless tobacco: Never Used  . Alcohol use No  . Drug use: No  . Sexual activity: Not on file   Other Topics Concern  . Not on file   Social History Narrative  . No narrative on file     Family History  Problem Relation Age of Onset  . CAD Father   . Pancreatic cancer Son      Review of Systems Positive for Shortness of breath orthopnea Negative for: General:  chills, fever, night sweats or weight changes.  Cardiovascular: Positive for PND orthopnea negative for syncope dizziness  Dermatological skin lesions rashes Respiratory: Cough congestion Urologic: Frequent urination urination at night and hematuria Abdominal: negative for nausea, vomiting, diarrhea, bright red blood per rectum, melena, or hematemesis Neurologic: negative for visual changes, and/or hearing changes  All other systems reviewed and are otherwise negative except as noted above.  Labs: No results for input(s): CKTOTAL, CKMB, TROPONINI in the last 72  hours. Lab Results  Component Value Date   WBC 3.6 02/23/2017   HGB 8.5 (L) 02/23/2017   HCT 24.6 (L) 02/23/2017   MCV 90.8 02/23/2017   PLT 249 02/23/2017    Recent Labs Lab 02/19/17 1907  02/23/17 0441 02/23/17 1303  NA 125*  < > 125* 122*  K 4.3  < > 4.3  --   CL 93*  < > 89*  --   CO2 23  < > 27  --   BUN 43*  < > 45*  --   CREATININE 2.28*  < > 2.14*  --   CALCIUM 8.7*  < > 8.6*  --   PROT 5.9*  --   --   --   BILITOT 0.5  --   --   --   ALKPHOS 62  --   --   --   ALT  11*  --   --   --   AST 18  --   --   --   GLUCOSE 102*  < > 111*  --   < > = values in this interval not displayed. Lab Results  Component Value Date   CHOL 137 09/03/2014   HDL 38 (L) 09/03/2014   LDLCALC 78 09/03/2014   TRIG 106 09/03/2014   No results found for: DDIMER  Radiology/Studies:  Dg Chest 1 View  Result Date: 02/22/2017 CLINICAL DATA:  Recent congestive heart failure EXAM: CHEST 1 VIEW COMPARISON:  February 20, 2017 FINDINGS: There is somewhat less interstitial edema compared to recent study. A small amount of residual interstitial edema remains, most notably in the left base. There is cardiomegaly with mild pulmonary venous hypertension. No consolidation. There is aortic atherosclerosis. No adenopathy. Bones are osteoporotic. IMPRESSION: Less interstitial edema compared to recent study. Mild edema remains, primarily in the left base. No consolidation. There is persistent cardiomegaly with mild pulmonary venous hypertension. There is aortic atherosclerosis. Bones are osteoporotic. Electronically Signed   By: Bretta Bang III M.D.   On: 02/22/2017 12:10   Dg Chest 2 View  Result Date: 02/20/2017 CLINICAL DATA:  Shortness of Breath EXAM: CHEST  2 VIEW COMPARISON:  February 19, 2017 FINDINGS: There is interstitial edema with a small right pleural effusion. There is atelectatic change in the right lower lung zone. There is cardiomegaly with pulmonary venous hypertension. Aorta is tortuous with  atherosclerotic calcification. No adenopathy. No bone lesions. IMPRESSION: Congestive heart failure. No airspace consolidation. There is aortic atherosclerosis. Electronically Signed   By: Bretta Bang III M.D.   On: 02/20/2017 07:23   Dg Chest 2 View  Result Date: 02/19/2017 CLINICAL DATA:  Dyspnea EXAM: CHEST  2 VIEW COMPARISON:  01/09/2017 chest radiograph. FINDINGS: Stable cardiomediastinal silhouette with mild cardiomegaly and tortuous atherosclerotic thoracic aorta. No pneumothorax. Trace bilateral pleural effusions. Hyperinflated lungs. Mild pulmonary edema. Mild left basilar scarring versus atelectasis. IMPRESSION: 1. Mild congestive heart failure. 2. Trace bilateral pleural effusions. 3. Mild scarring versus atelectasis at the left lung base. 4. Hyperinflated lungs, suggesting underlying obstructive lung disease. 5. Aortic atherosclerosis. Electronically Signed   By: Delbert Phenix M.D.   On: 02/19/2017 18:29    EKG: Atrial fibrillation with controlled ventricular rate and nonspecific ST and T-wave changes  Weights: Filed Weights   02/21/17 0538 02/22/17 0318 02/23/17 0400  Weight: 57.3 kg (126 lb 6.4 oz) 57.2 kg (126 lb 1.6 oz) 57.2 kg (126 lb)     Physical Exam: Blood pressure 128/76, pulse (!) 130, temperature 97.7 F (36.5 C), temperature source Oral, resp. rate 18, height  (1.448 m), weight 57.2 kg (126 lb), SpO2 94 %. Body mass index is 27.27 kg/m. General: Well developed, well nourished, in no acute distress. Head eyes ears nose throat: Normocephalic, atraumatic, sclera non-icteric, no xanthomas, nares are without discharge. No apparent thyromegaly and/or mass  Lungs: Normal respiratory effort.  no wheezes, basilar rales, no rhonchi.  Heart: Irregular with normal S1 S2. no murmur gallop, no rub, PMI is normal size and placement, carotid upstroke normal without bruit, jugular venous pressure is normal Abdomen: Soft, non-tender, non-distended with normoactive bowel  sounds. No hepatomegaly. No rebound/guarding. No obvious abdominal masses. Abdominal aorta is normal size without bruit Extremities: Trace to 1+ edema. no cyanosis, no clubbing, no ulcers  Peripheral : 2+ bilateral upper extremity pulses, 2+ bilateral femoral pulses, 2+ bilateral dorsal pedal pulse Neuro: Alert and oriented.  No facial asymmetry. No focal deficit. Moves all extremities spontaneously. Musculoskeletal: Normal muscle tone without kyphosis Psych:  Responds to questions appropriately with a normal affect.    Assessment: 83 year old female with acute on chronic systolic dysfunction heart failure with atrial fibrillation with rapid ventricular rate now more controlled due to diuresis and appropriate antihypertensive medication management without evidence of myocardial infarction  Plan: 1. Continue heart rate control of atrial fibrillation with carvedilol 2. Continue hypertension control with current medical regimen 3. Continue diuresis as able as per nephrology without causing worsening chronic kidney disease 4. No further cardiovascular diagnostics necessary at this time 5. Continue ambulation and follow for improvements of symptoms and further adjustments of medication management as needed  Signed, Lamar Blinks M.D. [facility] 02/23/2017, 6:06 PM

## 2017-02-23 NOTE — Progress Notes (Signed)
[facility]  ROUNDING NOTE   Subjective:  Patient well known to [place] from the office. She presented now with increasing shortness of breath over the past several weeks. She also has worsening hyponatremia for which we follow her as an outpatient. She was given Lasix earlier in the course of the admission. Baseline sodium appears to be between 129-131. Patient also has underlying chronic kidney disease stage IV. Baseline creatinine appears to be 1.8.   Objective:  Vital signs in last 24 hours:  Temp:  [97.7 F (36.5 C)-98.6 F (37 C)] 97.7 F (36.5 C) (04/05 1120) Pulse Rate:  [67-130] 130 (04/05 1120) Resp:  [17-19] 18 (04/05 1120) BP: (128-162)/(76-101) 128/76 (04/05 1120) SpO2:  [92 %-100 %] 94 % (04/05 1120) Weight:  [57.2 kg (126 lb)] 57.2 kg (126 lb) (04/05 0400)  Weight change: -0.045 kg (-1.6 oz) Filed Weights   02/21/17 0538 02/22/17 0318 02/23/17 0400  Weight: 57.3 kg (126 lb 6.4 oz) 57.2 kg (126 lb 1.6 oz) 57.2 kg (126 lb)    Intake/Output: I/O last 3 completed shifts: In: 480 [P.O.:480] Out: 1750 [Urine:1750]   Intake/Output this shift:  Total I/O In: 240 [P.O.:240] Out: 100 [Urine:100]  Physical Exam: General: No acute distress  Head: Normocephalic, atraumatic. Moist oral mucosal membranes  Eyes: Anicteric  Neck: Supple, trachea midline  Lungs:  Clear to auscultation, normal effort  Heart: S1S2 no rubs  Abdomen:  Soft, nontender  Extremities:  peripheral edema.  Neurologic: Nonfocal, moving all four extremities  Skin: No lesions       Basic Metabolic Panel:  Recent Labs Lab 02/19/17 1907 02/20/17 0356 02/21/17 1439 02/22/17 1207 02/23/17 0441  NA 125* 125* 120* 125* 125*  K 4.3 4.0 3.7 3.8 4.3  CL 93* 94* 87* 90* 89*  CO2 GLUCOSE 102* 98 103* 99 111*  BUN 43* 43* 41* 43* 45*  CREATININE 2.28* 2.25* 2.04* 1.95* 2.14*  CALCIUM 8.7* 8.7* 8.1* 8.4* 8.6*    Liver Function Tests:  Recent Labs Lab  02/19/17 1907  AST 18  ALT 11*  ALKPHOS 62  BILITOT 0.5  PROT 5.9*  ALBUMIN 3.2*   No results for input(s): LIPASE, AMYLASE in the last 168 hours. No results for input(s): AMMONIA in the last 168 hours.  CBC:  Recent Labs Lab 02/19/17 1907 02/20/17 0356 02/22/17 1207 02/23/17 0440  WBC 4.5 4.9  --  3.6  HGB 7.9* 7.4* 7.8* 8.5*  HCT 22.9* 21.3* 22.8* 24.6*  MCV 91.9 91.6  --  90.8  PLT 222 207  --  249    Cardiac Enzymes:  Recent Labs Lab 02/19/17 1907 02/19/17 2351 02/20/17 0356 02/20/17 0819  TROPONINI <0.03 0.03* 0.03* 0.03*    BNP: Invalid input(s): POCBNP  CBG: No results for input(s): GLUCAP in the last 168 hours.  Microbiology: Results for orders placed or performed during the hospital encounter of 02/19/17  MRSA PCR Screening     Status: None   Collection Time: 02/19/17 11:30 PM  Result Value Ref Range Status   MRSA by PCR NEGATIVE NEGATIVE Final    Comment:        The GeneXpert MRSA Assay (FDA approved for NASAL specimens only), is one component of a comprehensive MRSA colonization surveillance program. It is not intended to diagnose MRSA infection nor to guide or monitor treatment for MRSA infections.     Coagulation Studies: No results for input(s): LABPROT, INR in the last 72 hours.  Urinalysis:  Recent Labs  02/20/17 1827  COLORURINE YELLOW*  LABSPEC 1.006  PHURINE 5.0  GLUCOSEU NEGATIVE  HGBUR NEGATIVE  BILIRUBINUR NEGATIVE  KETONESUR NEGATIVE  PROTEINUR 100*  NITRITE NEGATIVE  LEUKOCYTESUR NEGATIVE      Imaging: Dg Chest 1 View  Result Date: 02/22/2017 CLINICAL DATA:  Recent congestive heart failure EXAM: CHEST 1 VIEW COMPARISON:  February 20, 2017 FINDINGS: There is somewhat less interstitial edema compared to recent study. A small amount of residual interstitial edema remains, most notably in the left base. There is cardiomegaly with mild pulmonary venous hypertension. No consolidation. There is aortic atherosclerosis.  No adenopathy. Bones are osteoporotic. IMPRESSION: Less interstitial edema compared to recent study. Mild edema remains, primarily in the left base. No consolidation. There is persistent cardiomegaly with mild pulmonary venous hypertension. There is aortic atherosclerosis. Bones are osteoporotic. Electronically Signed   By: Bretta Bang III M.D.   On: 02/22/2017 12:10     Medications:    . acetaminophen  1,000 mg Oral Daily  . aspirin EC  81 mg Oral Daily  . carvedilol  12.5 mg Oral BID  . docusate sodium  100 mg Oral BID  . Ferrous Gluconate  1 tablet Oral BID  . fluticasone  2 spray Each Nare Daily  . heparin  5,000 Units Subcutaneous Q8H  . hydrALAZINE  100 mg Oral BID  . ipratropium-albuterol  3 mL Nebulization TID  . isosorbide mononitrate  120 mg Oral Daily  . lisinopril  10 mg Oral Daily  . loratadine  10 mg Oral Daily  . mouth rinse  15 mL Mouth Rinse BID  . methylPREDNISolone (SOLU-MEDROL) injection  60 mg Intravenous Q12H  . multivitamin-lutein  1 capsule Oral Daily  . NIFEdipine  30 mg Oral Daily  . pantoprazole  40 mg Oral Daily  . simvastatin  40 mg Oral Daily  . sodium chloride flush  3 mL Intravenous Q12H   acetaminophen, docusate sodium, ondansetron (ZOFRAN) IV, traMADol  Assessment/ Plan:  83 y.o. female with hypertension, osteoporosis, GERD, anemia, superventricular tachycardia who presented to [facility] with increasing shortness of breath, acute renal failure, worsening hyponatremia.   1.  Hyponatremia, acute on chronic. 2.  CKD stage IV baseline Cr 1.8 3.  Anemia of CKD, hgb 8.5 4.  Hypertension.  Plan:  At the moment the patient looks rather euvolemic. She did receive diuretics earlier in the course of the hospitalization. Serum sodium currently 125. Her baseline sodium appears to be between 129-131. As such we will administer 1 dose of tolvaptan  PO x one and follow serum sodium closely.  Her renal function also appears to  be slightly worse than before which we will continue to monitor. Hemoglobin also quite low at 8.5.  If hemoglobin remains low we may need to consider use of Epogen as well.    LOS: 4 Ramir Malerba 4/5/201812:11 PM

## 2017-02-23 NOTE — Clinical Social Work Note (Signed)
CSW received consult that patient may need SNF for short term rehab.  CSW awaiting PT recommendations.  CSW to continue to follow patient's progress throughout discharge planning.  Ervin Knack. Clebert Wenger, MSW, Theresia Majors [phone]  02/23/2017 2:25 PM

## 2017-02-23 NOTE — Progress Notes (Signed)
[facility] - [place] at [facility]   PATIENT NAME: Laura [name]    MR#:  [id number]  DATE OF BIRTH:  [birth year] CHIEF COMPLAINT:   Chief Complaint  Patient presents with  . Shortness of Breath   Shortness of breath is improved. Dry cough.  REVIEW OF SYSTEMS:    Review of Systems  Constitutional: Negative for chills and fever.  HENT: Negative for hearing loss.   Eyes: Negative for blurred vision, double vision and photophobia.  Respiratory: Positive for shortness of breath. Negative for cough and hemoptysis.   Cardiovascular: Negative for chest pain, palpitations and orthopnea.  Gastrointestinal: Negative for abdominal pain, diarrhea and vomiting.  Genitourinary: Negative for dysuria and urgency.  Musculoskeletal: Negative for myalgias and neck pain.  Skin: Negative for rash.  Neurological: Negative for dizziness, focal weakness, seizures, weakness and headaches.  Psychiatric/Behavioral: Negative for memory loss. The patient does not have insomnia.    DRUG ALLERGIES:   Allergies  Allergen Reactions  . Penicillins Shortness Of Breath        . Codeine Itching  . Amlodipine Rash    Peripheral edema  . Ciprofloxacin Rash  . Sulfa Antibiotics Rash    VITALS:  Blood pressure 128/76, pulse (!) 130, temperature 97.7 F (36.5 C), temperature source Oral, resp. rate 18, height  (1.448 m), weight 57.2 kg (126 lb), SpO2 94 %.  PHYSICAL EXAMINATION:   Physical Exam  GENERAL:  83 y.o.-year-old patient lying in the bed with no acute distress. Decreased hearing EYES: Pupils equal, round, reactive to light and accommodation. No scleral icterus. Extraocular muscles intact.  HEENT: Head atraumatic, normocephalic. Oropharynx and nasopharynx clear.  NECK:  Supple, no jugular venous distention. No thyroid enlargement, no tenderness.  LUNGS: Bibasilar crackles CARDIOVASCULAR: S1, S2 normal. No murmurs, rubs, or gallops.  ABDOMEN: Soft, nontender,  nondistended. Bowel sounds present. No organomegaly or mass.  EXTREMITIES: 1+ pitting edema bilaterally .cyanosis, or clubbing.  NEUROLOGIC: Cranial nerves II through XII are intact. Muscle strength 5/5 in all extremities. Sensation intact. Gait not checked.  PSYCHIATRIC: The patient is alert and awake SKIN: No obvious rash, lesion, or ulcer.   LABORATORY PANEL:   CBC  Recent Labs Lab 02/23/17 0440  WBC 3.6  HGB 8.5*  HCT 24.6*  PLT 249   ------------------------------------------------------------------------------------------------------------------  Chemistries   Recent Labs Lab 02/19/17 1907  02/23/17 0441  NA 125*  < > 125*  K 4.3  < > 4.3  CL 93*  < > 89*  CO2 23  < > 27  GLUCOSE 102*  < > 111*  BUN 43*  < > 45*  CREATININE 2.28*  < > 2.14*  CALCIUM 8.7*  < > 8.6*  AST 18  --   --   ALT 11*  --   --   ALKPHOS 62  --   --   BILITOT 0.5  --   --   < > = values in this interval not displayed. ------------------------------------------------------------------------------------------------------------------  Cardiac Enzymes  Recent Labs Lab 02/20/17 0819  TROPONINI 0.03*   ------------------------------------------------------------------------------------------------------------------  RADIOLOGY:  Dg Chest 1 View  Result Date: 02/22/2017 CLINICAL DATA:  Recent congestive heart failure EXAM: CHEST 1 VIEW COMPARISON:  February 20, 2017 FINDINGS: There is somewhat less interstitial edema compared to recent study. A small amount of residual interstitial edema remains, most notably in the left base. There is cardiomegaly with mild pulmonary venous hypertension. No consolidation. There is aortic atherosclerosis. No adenopathy. Bones are osteoporotic. IMPRESSION: Less  interstitial edema compared to recent study. Mild edema remains, primarily in the left base. No consolidation. There is persistent cardiomegaly with mild pulmonary venous hypertension. There is aortic  atherosclerosis. Bones are osteoporotic. Electronically Signed   By: Bretta Bang III M.D.   On: 02/22/2017 12:10     ASSESSMENT AND PLAN:   Principal Problem:   Acute CHF (HCC) Active Problems:   CHF (congestive heart failure) (HCC)   # Acute on chronic diastolic heart failure: Lasix on hold because of worsening cr  nephrology consulted due to hyponatremia and CHF  # Acute on chronic hyponatremia likely secondary to CHF Stop lasix due to worsening Cr Tolvaptan per nephrology  # Chronic  kidney disease stage III: Stable  # Acute respiratory failure with hypoxia due to chf  Resolved Today on room air  #6 COPD exacerbation: Continue nebulizers. Change to PRN Change solumedrol to prednisone from tomorrow  * Afib with RVR Could be due to nebs Consult cardiology regarding recommendations    All the records are reviewed and case discussed with Care Management/Social Worker Management plans discussed with the patient, family and they are in agreement.  CODE STATUS: DNR  TOTAL TIME TAKING CARE OF THIS PATIENT: 35 minutes.   POSSIBLE D/C IN 1-2 DAYS, DEPENDING ON CLINICAL CONDITION.  Milagros Loll R M.D on 02/23/2017 at 1:04 PM  Between 7am to 6pm - Pager - [phone]  After 6pm go to [email or web address] - password [email or web address] [facility]  [place] [facility]  Office  [phone]  CC: Primary care physician; Marisue Ivan, MD

## 2017-02-23 NOTE — Evaluation (Signed)
Physical Therapy Evaluation Patient Details Name: Laura [name] MRN: [id number] DOB: February 05, 1924 Today's Date: 02/23/2017   History of Present Illness  Pt admitted for acute CHF. Pt with history of CHF, CKD, and HTN. Pt from [facility] ILF. Pt with complaints of SOB and swelling in legs  Clinical Impression  Pt is a pleasant 83 year old female who was admitted for acute CHF. Pt performs bed mobility with mod I, transfers with supervision, and ambulation with cga and RW. All mobility performed on room air with sats at 96% pre and 94% post. HR fluctuated from 119-122 with exertion. Pt demonstrates deficits with endurance/fatigue/strength. Pt appears motivated to perform PT and is able to participate in there-ex. Wrote there-ex on board for continuation while in hospital. Would benefit from skilled PT to address above deficits and promote optimal return to PLOF. Recommend transition to HHPT upon discharge from acute hospitalization.       Follow Up Recommendations Home health PT    Equipment Recommendations  None recommended by PT    Recommendations for Other Services       Precautions / Restrictions Precautions Precautions: Fall Restrictions Weight Bearing Restrictions: No      Mobility  Bed Mobility Overal bed mobility: Modified Independent             General bed mobility comments: uses railing for transferring towards EOB. Once seated, able to sit with upright posture  Transfers Overall transfer level: Needs assistance Equipment used: Rolling walker (2 wheeled) Transfers: Sit to/from Stand Sit to Stand: Supervision         General transfer comment: Pt able to push from seated surface. Upright posture noted. RW used for assistance  Ambulation/Gait Ambulation/Gait assistance: Min guard Ambulation Distance (Feet): 110 Feet Assistive device: Rolling walker (2 wheeled)       General Gait Details: Pt ambulated with slow gait speed, with some hip hiking noted on L  LE with decreased knee flexion during swing phase. No LOB, however unsteadiness noted during turns. Cues given to keep closer to RW.  Stairs            Wheelchair Mobility    Modified Rankin (Stroke Patients Only)       Balance Overall balance assessment: History of Falls;Needs assistance Sitting-balance support: Feet supported;No upper extremity supported Sitting balance-Leahy Scale: Normal     Standing balance support: Bilateral upper extremity supported Standing balance-Leahy Scale: Good                               Pertinent Vitals/Pain Pain Assessment: No/denies pain    Home Living Family/patient expects to be discharged to::  (Indep living)               Home Equipment: Environmental consultant - 2 wheels      Prior Function Level of Independence: Independent with assistive device(s)         Comments: uses RW for all mobility, does report 2 falls in last 6 months     Hand Dominance        Extremity/Trunk Assessment   Upper Extremity Assessment Upper Extremity Assessment: Overall WFL for tasks assessed    Lower Extremity Assessment Lower Extremity Assessment: Generalized weakness (L LE grossly 3+/5; R LE grossly 4/5)       Communication   Communication: No difficulties  Cognition Arousal/Alertness: Awake/alert Behavior During Therapy: WFL for tasks assessed/performed Overall Cognitive Status: Within Functional Limits for tasks  assessed                                        General Comments      Exercises Other Exercises Other Exercises: Pt ambulated to [facility] and was supervision with all hygiene tasks. Safe technique performed.  Other Exercises: Pt performed seated ther-ex including B LE SLRs, quad sets, and hip abd/add. ALl ther-ex performed x 10 reps with supervision. Wrote ther-ex on white board.   Assessment/Plan    PT Assessment Patient needs continued PT services  PT Problem List Decreased strength;Decreased  balance;Decreased mobility       PT Treatment Interventions Gait training;Therapeutic exercise;Balance training    PT Goals (Current goals can be found in the Care Plan section)  Acute Rehab PT Goals Patient Stated Goal: to go home PT Goal Formulation: With patient Time For Goal Achievement: 03/09/17 Potential to Achieve Goals: Good    Frequency Min 2X/week   Barriers to discharge        Co-evaluation               End of Session Equipment Utilized During Treatment: Gait belt Activity Tolerance: Patient tolerated treatment well Patient left: in chair;with chair alarm set Nurse Communication: Mobility status PT Visit Diagnosis: Unsteadiness on feet (R26.81);Repeated falls (R29.6);Difficulty in walking, not elsewhere classified (R26.2)    Time: 1610-9604 PT Time Calculation (min) (ACUTE ONLY): 26 min   Charges:   PT Evaluation $PT Eval Low Complexity: 1 Procedure PT Treatments $Therapeutic Exercise: 8-22 mins $Therapeutic Activity: 8-22 mins   PT G Codes:        Elizabeth Palau, PT, DPT [phone]   Nakima Fluegge 02/23/2017, 4:50 PM

## 2017-02-24 LAB — BASIC METABOLIC PANEL
ANION GAP: 9 (ref 5–15)
BUN: 47 mg/dL — AB (ref 6–20)
CO2: 26 mmol/L (ref 22–32)
Calcium: 8.8 mg/dL — ABNORMAL LOW (ref 8.9–10.3)
Chloride: 93 mmol/L — ABNORMAL LOW (ref 101–111)
Creatinine, Ser: 2.14 mg/dL — ABNORMAL HIGH (ref 0.44–1.00)
GFR calc Af Amer: 22 mL/min — ABNORMAL LOW (ref >60)
GFR calc non Af Amer: 19 mL/min — ABNORMAL LOW (ref >60)
GLUCOSE: 167 mg/dL — AB (ref 65–99)
POTASSIUM: 4.1 mmol/L (ref 3.5–5.1)
Sodium: 128 mmol/L — ABNORMAL LOW (ref 135–145)

## 2017-02-24 MED ORDER — DILTIAZEM HCL ER COATED BEADS 120 MG PO CP24
120.0000 mg | ORAL_CAPSULE | Freq: Every day | ORAL | Status: DC
  Administered 2017-02-24: 120 mg via ORAL
  Filled 2017-02-24: qty 1

## 2017-02-24 MED ORDER — POLYETHYLENE GLYCOL 3350 17 G PO PACK
17.0000 g | PACK | Freq: Every day | ORAL | Status: DC | PRN
Start: 2017-02-24 — End: 2017-02-27
  Administered 2017-02-25 – 2017-02-26 (×2): 17 g via ORAL
  Filled 2017-02-24 (×2): qty 1

## 2017-02-24 MED ORDER — DEXTROSE 5 % IV SOLN
5.0000 mg/h | INTRAVENOUS | Status: DC
  Filled 2017-02-24: qty 100

## 2017-02-24 MED ORDER — DILTIAZEM HCL 25 MG/5ML IV SOLN
5.0000 mg | Freq: Once | INTRAVENOUS | Status: AC
  Administered 2017-02-24: 5 mg via INTRAVENOUS
  Filled 2017-02-24: qty 5

## 2017-02-24 MED ORDER — CARVEDILOL 25 MG PO TABS
25.0000 mg | ORAL_TABLET | Freq: Two times a day (BID) | ORAL | Status: DC
  Administered 2017-02-24 – 2017-02-27 (×8): 25 mg via ORAL
  Filled 2017-02-24 (×9): qty 1

## 2017-02-24 MED ORDER — DILTIAZEM HCL 25 MG/5ML IV SOLN
10.0000 mg | Freq: Once | INTRAVENOUS | Status: AC
  Administered 2017-02-24: 10 mg via INTRAVENOUS
  Filled 2017-02-24: qty 5

## 2017-02-24 NOTE — Care Management Important Message (Signed)
Important Message  Patient Details  Name: Laura [name] MRN: [id number] Date of Birth: [birth year]   Medicare Important Message Given:  Yes  Signed IM notice given     Eber Hong, RN 02/24/2017, 9:31 AM

## 2017-02-24 NOTE — Progress Notes (Addendum)
Notified Dr. Sheryle Hail heart rate still up in the 130's despite the IV cardizem given.  More cardizem ordered .

## 2017-02-24 NOTE — Progress Notes (Signed)
[facility] Physicians - [place] at [facility]   PATIENT NAME: Laura [name]    MR#:  [id number]  DATE OF BIRTH:  Dec 12, 1923 CHIEF COMPLAINT:   Chief Complaint  Patient presents with  . Shortness of Breath   Shortness of breath is improved. Dry cough. A. fib with RVR. Afebrile. 1 episode of vomiting.  REVIEW OF SYSTEMS:    Review of Systems  Constitutional: Negative for chills and fever.  HENT: Negative for hearing loss.   Eyes: Negative for blurred vision, double vision and photophobia.  Respiratory: Positive for shortness of breath. Negative for cough and hemoptysis.   Cardiovascular: Negative for chest pain, palpitations and orthopnea.  Gastrointestinal: Negative for abdominal pain, diarrhea and vomiting.  Genitourinary: Negative for dysuria and urgency.  Musculoskeletal: Negative for myalgias and neck pain.  Skin: Negative for rash.  Neurological: Negative for dizziness, focal weakness, seizures, weakness and headaches.  Psychiatric/Behavioral: Negative for memory loss. The patient does not have insomnia.    DRUG ALLERGIES:   Allergies  Allergen Reactions  . Penicillins Shortness Of Breath        . Codeine Itching  . Amlodipine Rash    Peripheral edema  . Ciprofloxacin Rash  . Sulfa Antibiotics Rash    VITALS:  Blood pressure (!) 132/94, pulse (!) 108, temperature 98.5 F (36.9 C), temperature source Oral, resp. rate 18, height  (1.448 m), weight 57.1 kg (125 lb 15.5 oz), SpO2 99 %.  PHYSICAL EXAMINATION:   Physical Exam  GENERAL:  83 y.o.-year-old patient lying in the bed with no acute distress. Decreased hearing EYES: Pupils equal, round, reactive to light and accommodation. No scleral icterus. Extraocular muscles intact.  HEENT: Head atraumatic, normocephalic. Oropharynx and nasopharynx clear.  NECK:  Supple, no jugular venous distention. No thyroid enlargement, no tenderness.  LUNGS: Bibasilar crackles CARDIOVASCULAR: S1, S2  normal. No murmurs, rubs, or gallops.  ABDOMEN: Soft, nontender, nondistended. Bowel sounds present. No organomegaly or mass.  EXTREMITIES: 1+ pitting edema bilaterally .cyanosis, or clubbing.  NEUROLOGIC: Cranial nerves II through XII are intact. Muscle strength 5/5 in all extremities. Sensation intact. Gait not checked.  PSYCHIATRIC: The patient is alert and awake SKIN: No obvious rash, lesion, or ulcer.   LABORATORY PANEL:   CBC  Recent Labs Lab 02/23/17 0440  WBC 3.6  HGB 8.5*  HCT 24.6*  PLT 249   ------------------------------------------------------------------------------------------------------------------  Chemistries   Recent Labs Lab 02/19/17 1907  02/24/17 0921  NA 125*  < > 128*  K 4.3  < > 4.1  CL 93*  < > 93*  CO2 23  < > 26  GLUCOSE 102*  < > 167*  BUN 43*  < > 47*  CREATININE 2.28*  < > 2.14*  CALCIUM 8.7*  < > 8.8*  AST 18  --   --   ALT 11*  --   --   ALKPHOS 62  --   --   BILITOT 0.5  --   --   < > = values in this interval not displayed. ------------------------------------------------------------------------------------------------------------------  Cardiac Enzymes  Recent Labs Lab 02/20/17 0819  TROPONINI 0.03*   ------------------------------------------------------------------------------------------------------------------  RADIOLOGY:  No results found.   ASSESSMENT AND PLAN:   Principal Problem:   Acute CHF (HCC) Active Problems:   CHF (congestive heart failure) (HCC)   # Acute on chronic diastolic heart failure: Lasix on hold because of worsening cr  nephrology consulted due to hyponatremia and CHF On [place]  # Acute on chronic hyponatremia  likely secondary to CHF Stopped lasix due to worsening Cr Tolvaptan per nephrology  # Chronic  kidney disease stage III Stable  # Acute respiratory failure with hypoxia due to chf  Resolved on room air  # COPD exacerbation: Continue nebulizers. Change to PRN Change  solumedrol to prednisone from tomorrow  * Afib with RVR Could be due to nebs Consult cardiology regarding recommendations  Increased coreg  All the records are reviewed and case discussed with Care Management/Social Worker Management plans discussed with the patient, family and they are in agreement.  CODE STATUS: DNR  TOTAL TIME TAKING CARE OF THIS PATIENT: 35 minutes.   POSSIBLE D/C IN 1-2 DAYS, DEPENDING ON CLINICAL CONDITION.  Milagros Loll R M.D on 02/24/2017 at 4:01 PM  Between 7am to 6pm - Pager - [phone]  After 6pm go to [email or web address] - password [email or web address] [facility]  [place]  Hospitalists  Office  [phone]  CC: Primary care physician; Marisue Ivan, MD

## 2017-02-24 NOTE — Consult Note (Signed)
   [facility] CM Inpatient Consult   02/24/2017  Laura [name] [birth year] [id number]   Referral received by inpatient case manager for [facility] Care Management services and post hospital discharge follow up related to a diagnosis of HF. Patient was evaluated for community based chronic disease management services with [facility] care Management Program as a benefit of patient's [employer]. Called into patient room to explain [facility] Care Management services. Spoke with patient for several minutes before she requested I speak with her daughter Laura [name] who was at the bedside. Patient and her daughter stated that transportation is an issue for her and she could really use some assistance with this. Explained that our [facility] social worker could help with arranging transportation for patient. Patient endorses her  primary care provider to be Dr. Cordelia Poche.  Verbal consent received for services. Patient gave [phone] as the best number to reach her. She also gave permission to call Laura [name] at [phone] or [phone] if she can not be reached.Patient will receive post hospital discharge calls and be evaluated for monthly home visits. She will receive a call from a [facility] Social Worker to assist with transportation issues. [name] Care Management does not interfere with or replace any services arranged by the inpatient care management team. RNCM left contact information and [facility] literature at the bedside. Made inpatient RNCM aware that [facility] will be following for care management. For additional questions please contact:   Benigna Delisi RN, BSN [facility] Liaison  [phone]) Business Mobile [phone]) Toll free office

## 2017-02-24 NOTE — Care Management (Signed)
Spoke with patient and her daughter.  Daughter has just returned from vacation.  She verbalizes concern that patient is living alone and says patient has had some falls that she has not reported to her family to [facility] staff and family.  Patient wishes to return hone.  Discussed home health with patient and daughter.  Patient ambulate 110 feet with therapist yesterday and no balance issues.  Discussed that home health could provide nurse, aide and physical therapy.  No agency preference but patient would require a home visit within 24 hours of discharge.  Checking with [facility] to determine if home visit can be made within 24 hours.  If patient discharges Saturday or Sunday visit can be made by [facility].  Spoke with attending about order for home health SN PT Aide and telehealth

## 2017-02-24 NOTE — Progress Notes (Signed)
Notified Dr.Diamond of heart rate into the 140's despite two doses of IV cardizem. Will continue to monitor. Cardiac drip to be ordered.

## 2017-02-24 NOTE — Progress Notes (Signed)
[facility]  ROUNDING NOTE   Subjective:  Serum sodium improved significantly. Sodium up to 128. Renal function stable with a creatinine of 2.14. Still periods of tachycardia.   Objective:  Vital signs in last 24 hours:  Temp:  [98.3 F (36.8 C)-98.6 F (37 C)] 98.5 F (36.9 C) (04/06 0813) Pulse Rate:  [126-144] 128 (04/06 1003) Resp:  [18] 18 (04/06 0813) BP: (119-150)/(78-96) 119/78 (04/06 0932) SpO2:  [94 %-99 %] 99 % (04/06 0813) Weight:  [57.1 kg (125 lb 15.5 oz)] 57.1 kg (125 lb 15.5 oz) (04/06 0414)  Weight change: -0.013 kg (-0.5 oz) Filed Weights   02/22/17 0318 02/23/17 0400 02/24/17 0414  Weight: 57.2 kg (126 lb 1.6 oz) 57.2 kg (126 lb) 57.1 kg (125 lb 15.5 oz)    Intake/Output: I/O last 3 completed shifts: In: 720 [P.O.:720] Out: 3200 [Urine:3200]   Intake/Output this shift:  Total I/O In: 260 [P.O.:260] Out: -   Physical Exam: General: No acute distress  Head: Normocephalic, atraumatic. Moist oral mucosal membranes  Eyes: Anicteric  Neck: Supple, trachea midline  Lungs:  Clear to auscultation, normal effort  Heart: S1S2 irregular tachycardic  Abdomen:  Soft, nontender  Extremities: Trace peripheral edema.  Neurologic: Awake, alert, following commands  Skin: No lesions       Basic Metabolic Panel:  Recent Labs Lab 02/20/17 0356 02/21/17 1439 02/22/17 1207 02/23/17 0441 02/23/17 1303 02/24/17 0921  NA 125* 120* 125* 125* 122* 128*  K 4.0 3.7 3.8 4.3  --  4.1  CL 94* 87* 90* 89*  --  93*  CO2 --  26  GLUCOSE 98 103* 99 111*  --  167*  BUN 43* 41* 43* 45*  --  47*  CREATININE 2.25* 2.04* 1.95* 2.14*  --  2.14*  CALCIUM 8.7* 8.1* 8.4* 8.6*  --  8.8*    Liver Function Tests:  Recent Labs Lab 02/19/17 1907  AST 18  ALT 11*  ALKPHOS 62  BILITOT 0.5  PROT 5.9*  ALBUMIN 3.2*   No results for input(s): LIPASE, AMYLASE in the last 168 hours. No results for input(s): AMMONIA in the last 168  hours.  CBC:  Recent Labs Lab 02/19/17 1907 02/20/17 0356 02/22/17 1207 02/23/17 0440  WBC 4.5 4.9  --  3.6  HGB 7.9* 7.4* 7.8* 8.5*  HCT 22.9* 21.3* 22.8* 24.6*  MCV 91.9 91.6  --  90.8  PLT 222 207  --  249    Cardiac Enzymes:  Recent Labs Lab 02/19/17 1907 02/19/17 2351 02/20/17 0356 02/20/17 0819  TROPONINI <0.03 0.03* 0.03* 0.03*    BNP: Invalid input(s): POCBNP  CBG: No results for input(s): GLUCAP in the last 168 hours.  Microbiology: Results for orders placed or performed during the hospital encounter of 02/19/17  MRSA PCR Screening     Status: None   Collection Time: 02/19/17 11:30 PM  Result Value Ref Range Status   MRSA by PCR NEGATIVE NEGATIVE Final    Comment:        The GeneXpert MRSA Assay (FDA approved for NASAL specimens only), is one component of a comprehensive MRSA colonization surveillance program. It is not intended to diagnose MRSA infection nor to guide or monitor treatment for MRSA infections.     Coagulation Studies: No results for input(s): LABPROT, INR in the last 72 hours.  Urinalysis: No results for input(s): COLORURINE, LABSPEC, PHURINE, GLUCOSEU, HGBUR, BILIRUBINUR, KETONESUR, PROTEINUR, UROBILINOGEN, NITRITE, LEUKOCYTESUR in the last 72 hours.  Invalid input(s): APPERANCEUR    Imaging: Dg Chest 1 View  Result Date: 02/22/2017 CLINICAL DATA:  Recent congestive heart failure EXAM: CHEST 1 VIEW COMPARISON:  February 20, 2017 FINDINGS: There is somewhat less interstitial edema compared to recent study. A small amount of residual interstitial edema remains, most notably in the left base. There is cardiomegaly with mild pulmonary venous hypertension. No consolidation. There is aortic atherosclerosis. No adenopathy. Bones are osteoporotic. IMPRESSION: Less interstitial edema compared to recent study. Mild edema remains, primarily in the left base. No consolidation. There is persistent cardiomegaly with mild pulmonary venous  hypertension. There is aortic atherosclerosis. Bones are osteoporotic. Electronically Signed   By: Bretta Bang III M.D.   On: 02/22/2017 12:10     Medications:   . diltiazem (CARDIZEM) infusion     . acetaminophen  1,000 mg Oral Daily  . aspirin EC  81 mg Oral Daily  . carvedilol  25 mg Oral BID WC  . docusate sodium  100 mg Oral BID  . Ferrous Gluconate  1 tablet Oral BID  . fluticasone  2 spray Each Nare Daily  . heparin  5,000 Units Subcutaneous Q8H  . hydrALAZINE  100 mg Oral BID  . isosorbide mononitrate  120 mg Oral Daily  . lisinopril  10 mg Oral Daily  . loratadine  10 mg Oral Daily  . mouth rinse  15 mL Mouth Rinse BID  . multivitamin-lutein  1 capsule Oral Daily  . NIFEdipine  30 mg Oral Daily  . pantoprazole  40 mg Oral Daily  . predniSONE  20 mg Oral Q breakfast  . simvastatin  40 mg Oral Daily  . sodium chloride flush  3 mL Intravenous Q12H  . tolvaptan  15 mg Oral Q24H   acetaminophen, albuterol, docusate sodium, ondansetron (ZOFRAN) IV, traMADol  Assessment/ Plan:  83 y.o. female with hypertension, osteoporosis, GERD, anemia, superventricular tachycardia who presented to [facility] with increasing shortness of breath, acute renal failure, worsening hyponatremia.   1.  Hyponatremia, acute on chronic. 2.  CKD stage IV baseline Cr 1.8 3.  Anemia of CKD, hgb 8.5 4.  Hypertension.  Plan:  Hyponatremia has significantly improved. Serum sodium currently up to 128. Appropriate correction noted. We will plan for 1 additional dose of tolvaptan.  Renal function however appears to be stable. BUN currently 47 with a creatinine of 2.1. We will continue to monitor serum sodium as well as renal parameters. Consider Epogen as an outpatient given hemoglobin of 8.5.   LOS: 5 Dhilan Brauer 4/6/201811:20 AM

## 2017-02-24 NOTE — Progress Notes (Signed)
Updated Dr. Gwen Pounds, heart rate remaining a/fib 120-130s at rest after higher dose of carvedilol this morning. Instructed to cancel order for cardizem gtt and give  Cardizem CD daily

## 2017-02-24 NOTE — Progress Notes (Signed)
Per Dr. Gwen Pounds, do not start cardizem gtt at this time, adjusting oral medications for heart rate control first.

## 2017-02-24 NOTE — Progress Notes (Signed)
Notified Dr. Sheryle Hail of patient heart rate in the 130's, Afib.  IV cardizem ordered and given. Will continue to monitor.

## 2017-02-24 NOTE — Clinical Social Work Note (Signed)
CSW received referral for SNF.  Case discussed with case manager and plan is to discharge home with home health.  CSW to sign off please re-consult if social work needs arise.  Temitope Griffing R. Tatelyn Vanhecke, MSW, LCSWA [phone]  

## 2017-02-24 NOTE — Progress Notes (Signed)
[facility] [place] [facility] Encounter Note  Patient: Laura [name] / Admit Date: 02/19/2017 / Date of Encounter: 02/24/2017, 7:26 AM   Subjective: Patient is feeling much better overnight. Left shortness of breath weakness and fatigue. Patient has no lower extremity edema. Heart rate of atrial fibrillation reasonable although it 130 bpm when ambulating  Review of Systems: Positive for: Shortness of breath Negative for: Vision change, hearing change, syncope, dizziness, nausea, vomiting,diarrhea, bloody stool, stomach pain, cough, congestion, diaphoresis, urinary frequency, urinary pain,skin lesions, skin rashes Others previously listed  Objective: Telemetry: Trivial fibrillation with variable heart rate Physical Exam: Blood pressure (!) 150/96, pulse (!) 134, temperature 98.3 F (36.8 C), temperature source Oral, resp. rate 18, height  (1.448 m), weight 57.1 kg (125 lb 15.5 oz), SpO2 95 %. Body mass index is 27.26 kg/m. General: Well developed, well nourished, in no acute distress. Head: Normocephalic, atraumatic, sclera non-icteric, no xanthomas, nares are without discharge. Neck: No apparent masses Lungs: Normal respirations with no wheezes, no rhonchi, U rales , some crackles   Heart: Irregular rate and rhythm, normal S1 S2, no murmur, no rub, no gallop, PMI is normal size and placement, carotid upstroke normal without bruit, jugular venous pressure normal Abdomen: Soft, non-tender, non-distended with normoactive bowel sounds. No hepatosplenomegaly. Abdominal aorta is normal size without bruit Extremities: No edema, no clubbing, no cyanosis, no ulcers,  Peripheral: 2+ radial, 2+ femoral, 2+ dorsal pedal pulses Neuro: Alert and oriented. Moves all extremities spontaneously. Psych:  Responds to questions appropriately with a normal affect.   Intake/Output Summary (Last 24 hours) at 02/24/17 0726 Last data filed at 02/24/17 0145  Gross per 24 hour  Intake              720  ml  Output             2300 ml  Net            -1580 ml    Inpatient Medications:  . acetaminophen  1,000 mg Oral Daily  . aspirin EC  81 mg Oral Daily  . carvedilol  12.5 mg Oral BID  . docusate sodium  100 mg Oral BID  . Ferrous Gluconate  1 tablet Oral BID  . fluticasone  2 spray Each Nare Daily  . heparin  5,000 Units Subcutaneous Q8H  . hydrALAZINE  100 mg Oral BID  . isosorbide mononitrate  120 mg Oral Daily  . lisinopril  10 mg Oral Daily  . loratadine  10 mg Oral Daily  . mouth rinse  15 mL Mouth Rinse BID  . multivitamin-lutein  1 capsule Oral Daily  . NIFEdipine  30 mg Oral Daily  . pantoprazole  40 mg Oral Daily  . predniSONE  20 mg Oral Q breakfast  . simvastatin  40 mg Oral Daily  . sodium chloride flush  3 mL Intravenous Q12H  . tolvaptan  15 mg Oral Q24H   Infusions:  . diltiazem (CARDIZEM) infusion      Labs:  Recent Labs  02/22/17 1207 02/23/17 0441 02/23/17 1303  NA 125* 125* 122*  K 3.8 4.3  --   CL 90* 89*  --   CO2 25 27  --   GLUCOSE 99 111*  --   BUN 43* 45*  --   CREATININE 1.95* 2.14*  --   CALCIUM 8.4* 8.6*  --    No results for input(s): AST, ALT, ALKPHOS, BILITOT, PROT, ALBUMIN in the last 72 hours.  Recent Labs  02/22/17 1207 02/23/17 0440  WBC  --  3.6  HGB 7.8* 8.5*  HCT 22.8* 24.6*  MCV  --  90.8  PLT  --  249   No results for input(s): CKTOTAL, CKMB, TROPONINI in the last 72 hours. Invalid input(s): POCBNP No results for input(s): HGBA1C in the last 72 hours.   Weights: Filed Weights   02/22/17 0318 02/23/17 0400 02/24/17 0414  Weight: 57.2 kg (126 lb 1.6 oz) 57.2 kg (126 lb) 57.1 kg (125 lb 15.5 oz)     Radiology/Studies:  Dg Chest 1 View  Result Date: 02/22/2017 CLINICAL DATA:  Recent congestive heart failure EXAM: CHEST 1 VIEW COMPARISON:  February 20, 2017 FINDINGS: There is somewhat less interstitial edema compared to recent study. A small amount of residual interstitial edema remains, most notably in the left  base. There is cardiomegaly with mild pulmonary venous hypertension. No consolidation. There is aortic atherosclerosis. No adenopathy. Bones are osteoporotic. IMPRESSION: Less interstitial edema compared to recent study. Mild edema remains, primarily in the left base. No consolidation. There is persistent cardiomegaly with mild pulmonary venous hypertension. There is aortic atherosclerosis. Bones are osteoporotic. Electronically Signed   By: Bretta Bang III M.D.   On: 02/22/2017 12:10   Dg Chest 2 View  Result Date: 02/20/2017 CLINICAL DATA:  Shortness of Breath EXAM: CHEST  2 VIEW COMPARISON:  February 19, 2017 FINDINGS: There is interstitial edema with a small right pleural effusion. There is atelectatic change in the right lower lung zone. There is cardiomegaly with pulmonary venous hypertension. Aorta is tortuous with atherosclerotic calcification. No adenopathy. No bone lesions. IMPRESSION: Congestive heart failure. No airspace consolidation. There is aortic atherosclerosis. Electronically Signed   By: Bretta Bang III M.D.   On: 02/20/2017 07:23   Dg Chest 2 View  Result Date: 02/19/2017 CLINICAL DATA:  Dyspnea EXAM: CHEST  2 VIEW COMPARISON:  01/09/2017 chest radiograph. FINDINGS: Stable cardiomediastinal silhouette with mild cardiomegaly and tortuous atherosclerotic thoracic aorta. No pneumothorax. Trace bilateral pleural effusions. Hyperinflated lungs. Mild pulmonary edema. Mild left basilar scarring versus atelectasis. IMPRESSION: 1. Mild congestive heart failure. 2. Trace bilateral pleural effusions. 3. Mild scarring versus atelectasis at the left lung base. 4. Hyperinflated lungs, suggesting underlying obstructive lung disease. 5. Aortic atherosclerosis. Electronically Signed   By: Delbert Phenix M.D.   On: 02/19/2017 18:29     Assessment and Recommendation  83 y.o. female with acute on chronic systolic dysfunction congestive heart failure multifactorial including chronic kidney disease  without evidence of myocardial infarction having atrial fibrillation with rapid ventricular rate likely secondary to heart failure improving at this time 1. Consideration of increasing carvedilol dose for hypertension control and better heart rate control with ambulation for goal heart rate at rest between 60 and 90 bpm and ambulation under 110 bpm 2. Patient is to continue hypertension control with other medication management as listed 3. Follow for need for other diuresis by nephrology 4. No further cardiac diagnostics necessary at this time line 5. Begin ambulation and follow for need for adjustments of medication management with follow-up with cardiology next week Signed, Arnoldo Hooker M.D. FACC

## 2017-02-25 ENCOUNTER — Encounter: Payer: Self-pay | Admitting: Internal Medicine

## 2017-02-25 LAB — BASIC METABOLIC PANEL
ANION GAP: 8 (ref 5–15)
BUN: 52 mg/dL — ABNORMAL HIGH (ref 6–20)
CALCIUM: 8.4 mg/dL — AB (ref 8.9–10.3)
CO2: 28 mmol/L (ref 22–32)
CREATININE: 2.1 mg/dL — AB (ref 0.44–1.00)
Chloride: 94 mmol/L — ABNORMAL LOW (ref 101–111)
GFR, EST AFRICAN AMERICAN: 23 mL/min — AB (ref >60)
GFR, EST NON AFRICAN AMERICAN: 20 mL/min — AB (ref >60)
Glucose, Bld: 89 mg/dL (ref 65–99)
Potassium: 4.1 mmol/L (ref 3.5–5.1)
SODIUM: 130 mmol/L — AB (ref 135–145)

## 2017-02-25 MED ORDER — ISOSORBIDE MONONITRATE ER 60 MG PO TB24
120.0000 mg | ORAL_TABLET | Freq: Every day | ORAL | Status: DC
  Administered 2017-02-26 – 2017-02-27 (×2): 120 mg via ORAL
  Filled 2017-02-25 (×2): qty 2

## 2017-02-25 MED ORDER — DILTIAZEM HCL ER COATED BEADS 120 MG PO CP24: 120.0000 mg | ORAL_CAPSULE | Freq: Every day | ORAL | Status: DC

## 2017-02-25 MED ORDER — DILTIAZEM HCL ER COATED BEADS 180 MG PO CP24
180.0000 mg | ORAL_CAPSULE | Freq: Every day | ORAL | Status: DC
  Administered 2017-02-25: 180 mg via ORAL
  Filled 2017-02-25: qty 1

## 2017-02-25 MED ORDER — HYDRALAZINE HCL 50 MG PO TABS
50.0000 mg | ORAL_TABLET | Freq: Two times a day (BID) | ORAL | Status: DC
  Administered 2017-02-26 – 2017-02-27 (×3): 50 mg via ORAL
  Filled 2017-02-25 (×3): qty 1

## 2017-02-25 NOTE — Progress Notes (Signed)
[facility] [place] [facility]   PATIENT NAME: Laura [name]    MR#:  [id number]  DATE OF BIRTH:  March 09, 1924 CHIEF COMPLAINT:   Chief Complaint  Patient presents with  . Shortness of Breath   Still has Afib with RVR on tele into 130s No SOB   REVIEW OF SYSTEMS:    Review of Systems  Constitutional: Negative for chills and fever.  HENT: Negative for hearing loss.   Eyes: Negative for blurred vision, double vision and photophobia.  Respiratory: Positive for shortness of breath. Negative for cough and hemoptysis.   Cardiovascular: Negative for chest pain, palpitations and orthopnea.  Gastrointestinal: Negative for abdominal pain, diarrhea and vomiting.  Genitourinary: Negative for dysuria and urgency.  Musculoskeletal: Negative for myalgias and neck pain.  Skin: Negative for rash.  Neurological: Negative for dizziness, focal weakness, seizures, weakness and headaches.  Psychiatric/Behavioral: Negative for memory loss. The patient does not have insomnia.    DRUG ALLERGIES:   Allergies  Allergen Reactions  . Penicillins Shortness Of Breath        . Codeine Itching  . Amlodipine Rash    Peripheral edema  . Ciprofloxacin Rash  . Sulfa Antibiotics Rash    VITALS:  Blood pressure 104/78, pulse (!) 131, temperature 98.2 F (36.8 C), temperature source Oral, resp. rate 18, height  (1.448 m), weight 55 kg (121 lb 4.8 oz), SpO2 93 %.  PHYSICAL EXAMINATION:   Physical Exam  GENERAL:  83 y.o.-year-old patient lying in the bed with no acute distress. Decreased hearing EYES: Pupils equal, round, reactive to light and accommodation. No scleral icterus. Extraocular muscles intact.  HEENT: Head atraumatic, normocephalic. Oropharynx and nasopharynx clear.  NECK:  Supple, no jugular venous distention. No thyroid enlargement, no tenderness.  LUNGS: Bibasilar crackles CARDIOVASCULAR: S1, S2 irregular. Tachycardic ABDOMEN: Soft, nontender,  nondistended. Bowel sounds present. No organomegaly or mass.  EXTREMITIES: 1+ pitting edema bilaterally .cyanosis, or clubbing.  NEUROLOGIC: Cranial nerves II through XII are intact. Muscle strength 5/5 in all extremities. Sensation intact. Gait not checked.  PSYCHIATRIC: The patient is alert and awake SKIN: No obvious rash, lesion, or ulcer.   LABORATORY PANEL:   CBC  Recent Labs Lab 02/23/17 0440  WBC 3.6  HGB 8.5*  HCT 24.6*  PLT 249   ------------------------------------------------------------------------------------------------------------------  Chemistries   Recent Labs Lab 02/19/17 1907  02/25/17 0548  NA 125*  < > 130*  K 4.3  < > 4.1  CL 93*  < > 94*  CO2 23  < > 28  GLUCOSE 102*  < > 89  BUN 43*  < > 52*  CREATININE 2.28*  < > 2.10*  CALCIUM 8.7*  < > 8.4*  AST 18  --   --   ALT 11*  --   --   ALKPHOS 62  --   --   BILITOT 0.5  --   --   < > = values in this interval not displayed. ------------------------------------------------------------------------------------------------------------------  Cardiac Enzymes  Recent Labs Lab 02/20/17 0819  TROPONINI 0.03*   ------------------------------------------------------------------------------------------------------------------  RADIOLOGY:  No results found.   ASSESSMENT AND PLAN:   Principal Problem:   Acute CHF (HCC) Active Problems:   CHF (congestive heart failure) (HCC)   * Afib with RVR Consulted cardiology regarding recommendations  Increased coreg. Now she is on Cardizem. Increase Cardizem to 180 mg today   # Acute on chronic diastolic heart failure: Lasix on hold because of worsening cr  nephrology  consulted due to hyponatremia and CHF On [place]a  # Acute on chronic hyponatremia likely secondary to CHF - Improving Stopped lasix due to worsening Cr Tolvaptan per nephrology  # Chronic  kidney disease stage III Stable  # Acute respiratory failure with hypoxia due to chf   Resolved on room air  # COPD exacerbation: Continue nebulizers PRN Prednisone last dose today  # Weakness PT eval  All the records are reviewed and case discussed with Care Management/Social Worker Management plans discussed with the patient, family and they are in agreement.  CODE STATUS: DNR  TOTAL TIME TAKING CARE OF THIS PATIENT: 35 minutes.   POSSIBLE D/C IN 1-2 DAYS, DEPENDING ON CLINICAL CONDITION.  Milagros Loll R M.D on 02/25/2017 at 11:00 AM  Between 7am to 6pm - Pager - [phone]  After 6pm go to [email or web address] - password [email or web address] [facility]  [facility]  Office  [phone]  CC: Primary care physician; Marisue Ivan, MD

## 2017-02-25 NOTE — Progress Notes (Signed)
[facility] Encounter Note  Patient: Laura [name] / Admit Date: 02/19/2017 / Date of Encounter: 02/25/2017, 6:08 AM   Subjective: Patient is feeling much better overnight. Left shortness of breath weakness and fatigue. Patient has no lower extremity edema. Heart rate of atrial fibrillation reasonable although heart rate still slightly into the 120 bpm range with ambulation although the patient does not have any symptoms of this issue  Review of Systems: Positive for: Shortness of breath palpitations Negative for: Vision change, hearing change, syncope, dizziness, nausea, vomiting,diarrhea, bloody stool, stomach pain, cough, congestion, diaphoresis, urinary frequency, urinary pain,skin lesions, skin rashes Others previously listed  Objective: Telemetry: Trivial fibrillation with variable heart rate Physical Exam: Blood pressure 123/79, pulse (!) 115, temperature 98.6 F (37 C), temperature source Oral, resp. rate 18, height  (1.448 m), weight 55 kg (121 lb 4.8 oz), SpO2 93 %. Body mass index is 26.25 kg/m. General: Well developed, well nourished, in no acute distress. Head: Normocephalic, atraumatic, sclera non-icteric, no xanthomas, nares are without discharge. Neck: No apparent masses Lungs: Normal respirations with no wheezes, no rhonchi, U rales , some crackles   Heart: Irregular rate and rhythm, normal S1 S2, no murmur, no rub, no gallop, PMI is normal size and placement, carotid upstroke normal without bruit, jugular venous pressure normal Abdomen: Soft, non-tender, non-distended with normoactive bowel sounds. No hepatosplenomegaly. Abdominal aorta is normal size without bruit Extremities: No edema, no clubbing, no cyanosis, no ulcers,  Peripheral: 2+ radial, 2+ femoral, 2+ dorsal pedal pulses Neuro: Alert and oriented. Moves all extremities spontaneously. Psych:  Responds to questions appropriately with a normal affect.   Intake/Output Summary (Last 24  hours) at 02/25/17 9604 Last data filed at 02/25/17 0445  Gross per 24 hour  Intake              500 ml  Output             1825 ml  Net            -1325 ml    Inpatient Medications:  . acetaminophen  1,000 mg Oral Daily  . aspirin EC  81 mg Oral Daily  . carvedilol  25 mg Oral BID WC  . diltiazem  180 mg Oral Daily  . docusate sodium  100 mg Oral BID  . Ferrous Gluconate  1 tablet Oral BID  . fluticasone  2 spray Each Nare Daily  . heparin  5,000 Units Subcutaneous Q8H  . hydrALAZINE  100 mg Oral BID  . isosorbide mononitrate  120 mg Oral Daily  . lisinopril  10 mg Oral Daily  . loratadine  10 mg Oral Daily  . mouth rinse  15 mL Mouth Rinse BID  . multivitamin-lutein  1 capsule Oral Daily  . pantoprazole  40 mg Oral Daily  . predniSONE  20 mg Oral Q breakfast  . simvastatin  40 mg Oral Daily  . sodium chloride flush  3 mL Intravenous Q12H  . tolvaptan  15 mg Oral Q24H   Infusions:    Labs:  Recent Labs  02/23/17 0441 02/23/17 1303 02/24/17 0921  NA 125* 122* 128*  K 4.3  --  4.1  CL 89*  --  93*  CO2 27  --  26  GLUCOSE 111*  --  167*  BUN 45*  --  47*  CREATININE 2.14*  --  2.14*  CALCIUM 8.6*  --  8.8*   No results for input(s): AST, ALT, ALKPHOS, BILITOT,  PROT, ALBUMIN in the last 72 hours.  Recent Labs  02/22/17 1207 02/23/17 0440  WBC  --  3.6  HGB 7.8* 8.5*  HCT 22.8* 24.6*  MCV  --  90.8  PLT  --  249   No results for input(s): CKTOTAL, CKMB, TROPONINI in the last 72 hours. Invalid input(s): POCBNP No results for input(s): HGBA1C in the last 72 hours.   Weights: Filed Weights   02/23/17 0400 02/24/17 0414 02/25/17 0444  Weight: 57.2 kg (126 lb) 57.1 kg (125 lb 15.5 oz) 55 kg (121 lb 4.8 oz)     Radiology/Studies:  Dg Chest 1 View  Result Date: 02/22/2017 CLINICAL DATA:  Recent congestive heart failure EXAM: CHEST 1 VIEW COMPARISON:  February 20, 2017 FINDINGS: There is somewhat less interstitial edema compared to recent study. A small  amount of residual interstitial edema remains, most notably in the left base. There is cardiomegaly with mild pulmonary venous hypertension. No consolidation. There is aortic atherosclerosis. No adenopathy. Bones are osteoporotic. IMPRESSION: Less interstitial edema compared to recent study. Mild edema remains, primarily in the left base. No consolidation. There is persistent cardiomegaly with mild pulmonary venous hypertension. There is aortic atherosclerosis. Bones are osteoporotic. Electronically Signed   By: Bretta Bang III M.D.   On: 02/22/2017 12:10   Dg Chest 2 View  Result Date: 02/20/2017 CLINICAL DATA:  Shortness of Breath EXAM: CHEST  2 VIEW COMPARISON:  February 19, 2017 FINDINGS: There is interstitial edema with a small right pleural effusion. There is atelectatic change in the right lower lung zone. There is cardiomegaly with pulmonary venous hypertension. Aorta is tortuous with atherosclerotic calcification. No adenopathy. No bone lesions. IMPRESSION: Congestive heart failure. No airspace consolidation. There is aortic atherosclerosis. Electronically Signed   By: Bretta Bang III M.D.   On: 02/20/2017 07:23   Dg Chest 2 View  Result Date: 02/19/2017 CLINICAL DATA:  Dyspnea EXAM: CHEST  2 VIEW COMPARISON:  01/09/2017 chest radiograph. FINDINGS: Stable cardiomediastinal silhouette with mild cardiomegaly and tortuous atherosclerotic thoracic aorta. No pneumothorax. Trace bilateral pleural effusions. Hyperinflated lungs. Mild pulmonary edema. Mild left basilar scarring versus atelectasis. IMPRESSION: 1. Mild congestive heart failure. 2. Trace bilateral pleural effusions. 3. Mild scarring versus atelectasis at the left lung base. 4. Hyperinflated lungs, suggesting underlying obstructive lung disease. 5. Aortic atherosclerosis. Electronically Signed   By: Delbert Phenix M.D.   On: 02/19/2017 18:29     Assessment and Recommendation  83 y.o. female with acute on chronic systolic dysfunction  congestive heart failure multifactorial including chronic kidney disease without evidence of myocardial infarction having atrial fibrillation with rapid ventricular rate likely secondary to heart failure improving at this time 1. Continue higher carvedilol dose with additional diltiazem at increased rate of 180 mg for better heart rate control of chronic nonvalvular atrial fibrillation to reduce episodes of congestive heart failure 2. Patient is to continue hypertension control with other medication management as listed 3. Follow for need for other diuresis by nephrology 4. No further cardiac diagnostics necessary at this time   5. Continue ambulation and follow for need for adjustments of medication management with follow-up with cardiology next week 6. Call if further questions Signed, Arnoldo Hooker M.D. FACC

## 2017-02-25 NOTE — Progress Notes (Signed)
Spoke with dr. Gwen Pounds per md change cardizem cd from  to  daily. For just tonight change coreg  po dose to be given at 2100 instead of 1700

## 2017-02-25 NOTE — Progress Notes (Signed)
Made aware by ccmd patient had 2.4 second pause. Dr. Elpidio Anis on the floor md made aware. md to evaluate patient and place orders as needed

## 2017-02-25 NOTE — Progress Notes (Addendum)
Text paged dr. Elpidio Anis to make aware patient is having more frequent pauses, longest was 2.5 sec. Heart rate ranging 70-110's. Patient currently resting in be with eyes closed. No distress noted. Family at bedside. Will continue to monitor

## 2017-02-25 NOTE — Progress Notes (Addendum)
Spoke with dr. Elpidio Anis to make aware of pauses per md hold evening dose of coreg. Also page dr. Gwen Pounds to make aware. Waiting for dr. Gwen Pounds to return page

## 2017-02-25 NOTE — Progress Notes (Signed)
[facility]  ROUNDING NOTE   Subjective:  Renal function about the same. BUN currently 52 with a creatinine of 2.1. Serum sodium is up to 130 however.   Objective:  Vital signs in last 24 hours:  Temp:  [97.9 F (36.6 C)-98.6 F (37 C)] 98.1 F (36.7 C) (04/07 1350) Pulse Rate:  [91-133] 132 (04/07 1350) Resp:  [18-20] 20 (04/07 1350) BP: (98-135)/(64-92) 106/79 (04/07 1350) SpO2:  [93 %-100 %] 97 % (04/07 1350) Weight:  [55 kg (121 lb 4.8 oz)] 55 kg (121 lb 4.8 oz) (04/07 0444)  Weight change: -2.119 kg (-4 lb 10.7 oz) Filed Weights   02/23/17 0400 02/24/17 0414 02/25/17 0444  Weight: 57.2 kg (126 lb) 57.1 kg (125 lb 15.5 oz) 55 kg (121 lb 4.8 oz)    Intake/Output: I/O last 3 completed shifts: In: 500 [P.O.:500] Out: 3475 [Urine:3475]   Intake/Output this shift:  Total I/O In: 440 [P.O.:440] Out: 200 [Urine:200]  Physical Exam: General: No acute distress  Head: Normocephalic, atraumatic. Moist oral mucosal membranes  Eyes: Anicteric  Neck: Supple, trachea midline  Lungs:  Clear to auscultation, normal effort  Heart: S1S2 irregular tachycardic  Abdomen:  Soft, nontender  Extremities: Trace peripheral edema  Neurologic: Awake, alert, following commands  Skin: No lesions       Basic Metabolic Panel:  Recent Labs Lab 02/21/17 1439 02/22/17 1207 02/23/17 0441 02/23/17 1303 02/24/17 0921 02/25/17 0548  NA 120* 125* 125* 122* 128* 130*  K 3.7 3.8 4.3  --  4.1 4.1  CL 87* 90* 89*  --  93* 94*  CO2 --  26 28  GLUCOSE 103* 99 111*  --  167* 89  BUN 41* 43* 45*  --  47* 52*  CREATININE 2.04* 1.95* 2.14*  --  2.14* 2.10*  CALCIUM 8.1* 8.4* 8.6*  --  8.8* 8.4*    Liver Function Tests:  Recent Labs Lab 02/19/17 1907  AST 18  ALT 11*  ALKPHOS 62  BILITOT 0.5  PROT 5.9*  ALBUMIN 3.2*   No results for input(s): LIPASE, AMYLASE in the last 168 hours. No results for input(s): AMMONIA in the last 168 hours.  CBC:  Recent  Labs Lab 02/19/17 1907 02/20/17 0356 02/22/17 1207 02/23/17 0440  WBC 4.5 4.9  --  3.6  HGB 7.9* 7.4* 7.8* 8.5*  HCT 22.9* 21.3* 22.8* 24.6*  MCV 91.9 91.6  --  90.8  PLT 222 207  --  249    Cardiac Enzymes:  Recent Labs Lab 02/19/17 1907 02/19/17 2351 02/20/17 0356 02/20/17 0819  TROPONINI <0.03 0.03* 0.03* 0.03*    BNP: Invalid input(s): POCBNP  CBG: No results for input(s): GLUCAP in the last 168 hours.  Microbiology: Results for orders placed or performed during the hospital encounter of 02/19/17  MRSA PCR Screening     Status: None   Collection Time: 02/19/17 11:30 PM  Result Value Ref Range Status   MRSA by PCR NEGATIVE NEGATIVE Final    Comment:        The GeneXpert MRSA Assay (FDA approved for NASAL specimens only), is one component of a comprehensive MRSA colonization surveillance program. It is not intended to diagnose MRSA infection nor to guide or monitor treatment for MRSA infections.     Coagulation Studies: No results for input(s): LABPROT, INR in the last 72 hours.  Urinalysis: No results for input(s): COLORURINE, LABSPEC, PHURINE, GLUCOSEU, HGBUR, BILIRUBINUR, KETONESUR, PROTEINUR, UROBILINOGEN, NITRITE, LEUKOCYTESUR in the last  72 hours.  Invalid input(s): APPERANCEUR    Imaging: No results found.   Medications:    . acetaminophen  1,000 mg Oral Daily  . aspirin EC  81 mg Oral Daily  . carvedilol  25 mg Oral BID WC  . diltiazem  180 mg Oral Daily  . docusate sodium  100 mg Oral BID  . Ferrous Gluconate  1 tablet Oral BID  . fluticasone  2 spray Each Nare Daily  . heparin  5,000 Units Subcutaneous Q8H  . [START ON 02/26/2017] hydrALAZINE  50 mg Oral BID  . [START ON 02/26/2017] isosorbide mononitrate  120 mg Oral Daily  . loratadine  10 mg Oral Daily  . mouth rinse  15 mL Mouth Rinse BID  . multivitamin-lutein  1 capsule Oral Daily  . pantoprazole  40 mg Oral Daily  . predniSONE  20 mg Oral Q breakfast  . simvastatin  40 mg  Oral Daily  . sodium chloride flush  3 mL Intravenous Q12H   acetaminophen, albuterol, ondansetron (ZOFRAN) IV, polyethylene glycol, traMADol  Assessment/ Plan:  83 y.o. female with hypertension, osteoporosis, GERD, anemia, superventricular tachycardia who presented to [facility] with increasing shortness of breath, acute renal failure, worsening hyponatremia.   1.  Hyponatremia, acute on chronic. 2.  CKD stage IV baseline Cr 1.8 3.  Anemia of CKD, hgb 8.5 4.  Hypertension.  Plan:  It appears that the patient's serum sodium has improved back to her prior baseline. Therefore we have discontinued ADH antagonist. Renal function stable with a creatinine 2.10. This is still slightly higher than her prior baseline creatinine of 1.8. Recommend continued monitoring of her underlying renal function.  Otherwise management of atrial fibrillation as per hospitalist and cardiology. We will continue to periodically monitor the patient.   LOS: 6 Mikela Senn 4/7/20182:20 PM

## 2017-02-25 NOTE — Progress Notes (Signed)
Patient c/o sob. Sat on RA 95. Head of bed elevated, temp turned down per patients request. 02 applied at 2l for comfort, patient stated breathing is a little better. Will continue to monitor

## 2017-02-25 NOTE — Progress Notes (Addendum)
SPOKE WITH DR. Elpidio Anis REGARDING LOW BP 104/78, HR 130'S AND COREG  PO. PER MD OKAY TO GIVE COREG. HOLD HYDRALAZINE  AT 1000 IF BP REMAINS LOW. OKAY TO GIVE CARDIZEM. WILL CONTINUE TO MONITOR CLOSELY

## 2017-02-26 LAB — BASIC METABOLIC PANEL
ANION GAP: 6 (ref 5–15)
BUN: 55 mg/dL — ABNORMAL HIGH (ref 6–20)
CHLORIDE: 94 mmol/L — AB (ref 101–111)
CO2: 29 mmol/L (ref 22–32)
CREATININE: 2.17 mg/dL — AB (ref 0.44–1.00)
Calcium: 8.7 mg/dL — ABNORMAL LOW (ref 8.9–10.3)
GFR calc non Af Amer: 19 mL/min — ABNORMAL LOW (ref >60)
GFR, EST AFRICAN AMERICAN: 22 mL/min — AB (ref >60)
Glucose, Bld: 102 mg/dL — ABNORMAL HIGH (ref 65–99)
POTASSIUM: 5.3 mmol/L — AB (ref 3.5–5.1)
SODIUM: 129 mmol/L — AB (ref 135–145)

## 2017-02-26 LAB — HEMOGLOBIN: Hemoglobin: 8.6 g/dL — ABNORMAL LOW (ref 12.0–16.0)

## 2017-02-26 MED ORDER — SODIUM POLYSTYRENE SULFONATE 15 GM/60ML PO SUSP
15.0000 g | Freq: Once | ORAL | Status: AC
  Administered 2017-02-26: 15 g via ORAL
  Filled 2017-02-26: qty 60

## 2017-02-26 MED ORDER — DILTIAZEM HCL ER COATED BEADS 180 MG PO CP24
180.0000 mg | ORAL_CAPSULE | Freq: Every day | ORAL | Status: DC
  Administered 2017-02-26: 180 mg via ORAL
  Filled 2017-02-26: qty 1

## 2017-02-26 NOTE — Progress Notes (Signed)
[facility] Encounter Note  Patient: Laura [name] / Admit Date: 02/19/2017 / Date of Encounter: 02/26/2017, 6:44 AM   Subjective: Patient is feeling well overnight. Lasix shortness of breath weakness and fatigue. Patient has no lower extremity edema. Heart rate of atrial fibrillation reasonable although heart rate still slightly into the 120 bpm range with ambulation and some slow heart rate while sleeping with up to 2.5 seconds of RR interval although the patient does not have any symptoms of this issue. Therefore current medical regimen appears to be relatively appropriate with some symptoms and signs of sick sinus syndrome currently not appearing to need a permanent pacemaker  Review of Systems: Positive for: None Negative for: Vision change, hearing change, syncope, dizziness, nausea, vomiting,diarrhea, bloody stool, stomach pain, cough, congestion, diaphoresis, urinary frequency, urinary pain,skin lesions, skin rashes Others previously listed  Objective: Telemetry: Atrial fibrillation with variable heart rate Physical Exam: Blood pressure 133/86, pulse (!) 124, temperature 97.5 F (36.4 C), resp. rate 18, height  (1.448 m), weight 56.3 kg (124 lb 3 oz), SpO2 98 %. Body mass index is 26.87 kg/m. General: Well developed, well nourished, in no acute distress. Head: Normocephalic, atraumatic, sclera non-icteric, no xanthomas, nares are without discharge. Neck: No apparent masses Lungs: Normal respirations with no wheezes, no rhonchi, U rales , some crackles   Heart: Irregular rate and rhythm, normal S1 S2, no murmur, no rub, no gallop, PMI is normal size and placement, carotid upstroke normal without bruit, jugular venous pressure normal Abdomen: Soft, non-tender, non-distended with normoactive bowel sounds. No hepatosplenomegaly. Abdominal aorta is normal size without bruit Extremities: No edema, no clubbing, no cyanosis, no ulcers,  Peripheral: 2+ radial, 2+  femoral, 2+ dorsal pedal pulses Neuro: Alert and oriented. Moves all extremities spontaneously. Psych:  Responds to questions appropriately with a normal affect.   Intake/Output Summary (Last 24 hours) at 02/26/17 0644 Last data filed at 02/26/17 0330  Gross per 24 hour  Intake              440 ml  Output             1100 ml  Net             -660 ml    Inpatient Medications:  . acetaminophen  1,000 mg Oral Daily  . aspirin EC  81 mg Oral Daily  . carvedilol  25 mg Oral BID WC  . diltiazem  180 mg Oral Daily  . docusate sodium  100 mg Oral BID  . Ferrous Gluconate  1 tablet Oral BID  . fluticasone  2 spray Each Nare Daily  . heparin  5,000 Units Subcutaneous Q8H  . hydrALAZINE  50 mg Oral BID  . isosorbide mononitrate  120 mg Oral Daily  . loratadine  10 mg Oral Daily  . mouth rinse  15 mL Mouth Rinse BID  . multivitamin-lutein  1 capsule Oral Daily  . pantoprazole  40 mg Oral Daily  . predniSONE  20 mg Oral Q breakfast  . simvastatin  40 mg Oral Daily  . sodium chloride flush  3 mL Intravenous Q12H   Infusions:    Labs:  Recent Labs  02/25/17 0548 02/26/17 0522  NA 130* 129*  K 4.1 5.3*  CL 94* 94*  CO2 28 29  GLUCOSE 89 102*  BUN 52* 55*  CREATININE 2.10* 2.17*  CALCIUM 8.4* 8.7*   No results for input(s): AST, ALT, ALKPHOS, BILITOT, PROT, ALBUMIN in the last  72 hours.  Recent Labs  02/26/17 0522  HGB 8.6*   No results for input(s): CKTOTAL, CKMB, TROPONINI in the last 72 hours. Invalid input(s): POCBNP No results for input(s): HGBA1C in the last 72 hours.   Weights: Filed Weights   02/24/17 0414 02/25/17 0444 02/26/17 0445  Weight: 57.1 kg (125 lb 15.5 oz) 55 kg (121 lb 4.8 oz) 56.3 kg (124 lb 3 oz)     Radiology/Studies:  Dg Chest 1 View  Result Date: 02/22/2017 CLINICAL DATA:  Recent congestive heart failure EXAM: CHEST 1 VIEW COMPARISON:  February 20, 2017 FINDINGS: There is somewhat less interstitial edema compared to recent study. A small  amount of residual interstitial edema remains, most notably in the left base. There is cardiomegaly with mild pulmonary venous hypertension. No consolidation. There is aortic atherosclerosis. No adenopathy. Bones are osteoporotic. IMPRESSION: Less interstitial edema compared to recent study. Mild edema remains, primarily in the left base. No consolidation. There is persistent cardiomegaly with mild pulmonary venous hypertension. There is aortic atherosclerosis. Bones are osteoporotic. Electronically Signed   By: Bretta Bang III M.D.   On: 02/22/2017 12:10   Dg Chest 2 View  Result Date: 02/20/2017 CLINICAL DATA:  Shortness of Breath EXAM: CHEST  2 VIEW COMPARISON:  February 19, 2017 FINDINGS: There is interstitial edema with a small right pleural effusion. There is atelectatic change in the right lower lung zone. There is cardiomegaly with pulmonary venous hypertension. Aorta is tortuous with atherosclerotic calcification. No adenopathy. No bone lesions. IMPRESSION: Congestive heart failure. No airspace consolidation. There is aortic atherosclerosis. Electronically Signed   By: Bretta Bang III M.D.   On: 02/20/2017 07:23   Dg Chest 2 View  Result Date: 02/19/2017 CLINICAL DATA:  Dyspnea EXAM: CHEST  2 VIEW COMPARISON:  01/09/2017 chest radiograph. FINDINGS: Stable cardiomediastinal silhouette with mild cardiomegaly and tortuous atherosclerotic thoracic aorta. No pneumothorax. Trace bilateral pleural effusions. Hyperinflated lungs. Mild pulmonary edema. Mild left basilar scarring versus atelectasis. IMPRESSION: 1. Mild congestive heart failure. 2. Trace bilateral pleural effusions. 3. Mild scarring versus atelectasis at the left lung base. 4. Hyperinflated lungs, suggesting underlying obstructive lung disease. 5. Aortic atherosclerosis. Electronically Signed   By: Delbert Phenix M.D.   On: 02/19/2017 18:29     Assessment and Recommendation  83 y.o. female with acute on chronic systolic dysfunction  congestive heart failure multifactorial including chronic kidney disease without evidence of myocardial infarction having atrial fibrillation with rapid ventricular rate likely secondary to heart failure improving at this time 1. Continue higher carvedilol dose with additional diltiazem at increased rate of 180 mg for better heart rate control of chronic nonvalvular atrial fibrillation to reduce episodes of congestive heart failureAnd shortness of breath despite longer RR intervals while sleeping which is appropriate at this time without symptoms and not needing permanent pacemaker 2. Patient is to continue hypertension control with other medication management as listed 3. Follow for need for other diuresis by nephrology 4. No further cardiac diagnostics necessary at this time   5. Continue ambulation and follow for need for adjustments of medication management with follow-up with cardiology next week 6. Call if further questions Signed, Arnoldo Hooker M.D. FACC

## 2017-02-26 NOTE — Progress Notes (Signed)
Dr. Sheryle Hail informed of K+ at 5.3. No new orders.

## 2017-02-26 NOTE — Progress Notes (Signed)
[facility]  ROUNDING NOTE   Subjective:  Renal function appears to have stabilized. Serum sodium currently 129. Patient was taking a bath earlier. Patient's daughter at bedside.  Objective:  Vital signs in last 24 hours:  Temp:  [97.5 F (36.4 C)-98.1 F (36.7 C)] 97.5 F (36.4 C) (04/08 0326) Pulse Rate:  [108-132] 123 (04/08 1256) Resp:  [18-20] 18 (04/08 0326) BP: (106-133)/(79-90) 113/86 (04/08 0925) SpO2:  [95 %-98 %] 97 % (04/08 1315) Weight:  [56.3 kg (124 lb 3 oz)] 56.3 kg (124 lb 3 oz) (04/08 0445)  Weight change: 1.31 kg (2 lb 14.2 oz) Filed Weights   02/24/17 0414 02/25/17 0444 02/26/17 0445  Weight: 57.1 kg (125 lb 15.5 oz) 55 kg (121 lb 4.8 oz) 56.3 kg (124 lb 3 oz)    Intake/Output: I/O last 3 completed shifts: In: 440 [P.O.:440] Out: 1700 [Urine:1700]   Intake/Output this shift:  Total I/O In: 240 [P.O.:240] Out: 200 [Urine:200]  Physical Exam: General: No acute distress  Head: Normocephalic, atraumatic. Moist oral mucosal membranes  Eyes: Anicteric  Neck: Supple, trachea midline  Lungs:  Clear to auscultation, normal effort  Heart: S1S2 irregular tachycardic  Abdomen:  Soft, nontender  Extremities: Trace peripheral edema  Neurologic: Awake, alert, following commands  Skin: No lesions       Basic Metabolic Panel:  Recent Labs Lab 02/22/17 1207 02/23/17 0441 02/23/17 1303 02/24/17 0921 02/25/17 0548 02/26/17 0522  NA 125* 125* 122* 128* 130* 129*  K 3.8 4.3  --  4.1 4.1 5.3*  CL 90* 89*  --  93* 94* 94*  CO2 25 27  --  GLUCOSE 99 111*  --  167* 89 102*  BUN 43* 45*  --  47* 52* 55*  CREATININE 1.95* 2.14*  --  2.14* 2.10* 2.17*  CALCIUM 8.4* 8.6*  --  8.8* 8.4* 8.7*    Liver Function Tests:  Recent Labs Lab 02/19/17 1907  AST 18  ALT 11*  ALKPHOS 62  BILITOT 0.5  PROT 5.9*  ALBUMIN 3.2*   No results for input(s): LIPASE, AMYLASE in the last 168 hours. No results for input(s): AMMONIA in the last  168 hours.  CBC:  Recent Labs Lab 02/19/17 1907 02/20/17 0356 02/22/17 1207 02/23/17 0440 02/26/17 0522  WBC 4.5 4.9  --  3.6  --   HGB 7.9* 7.4* 7.8* 8.5* 8.6*  HCT 22.9* 21.3* 22.8* 24.6*  --   MCV 91.9 91.6  --  90.8  --   PLT 222 207  --  249  --     Cardiac Enzymes:  Recent Labs Lab 02/19/17 1907 02/19/17 2351 02/20/17 0356 02/20/17 0819  TROPONINI <0.03 0.03* 0.03* 0.03*    BNP: Invalid input(s): POCBNP  CBG: No results for input(s): GLUCAP in the last 168 hours.  Microbiology: Results for orders placed or performed during the hospital encounter of 02/19/17  MRSA PCR Screening     Status: None   Collection Time: 02/19/17 11:30 PM  Result Value Ref Range Status   MRSA by PCR NEGATIVE NEGATIVE Final    Comment:        The GeneXpert MRSA Assay (FDA approved for NASAL specimens only), is one component of a comprehensive MRSA colonization surveillance program. It is not intended to diagnose MRSA infection nor to guide or monitor treatment for MRSA infections.     Coagulation Studies: No results for input(s): LABPROT, INR in the last 72 hours.  Urinalysis: No results  for input(s): COLORURINE, LABSPEC, PHURINE, GLUCOSEU, HGBUR, BILIRUBINUR, KETONESUR, PROTEINUR, UROBILINOGEN, NITRITE, LEUKOCYTESUR in the last 72 hours.  Invalid input(s): APPERANCEUR    Imaging: No results found.   Medications:    . acetaminophen  1,000 mg Oral Daily  . aspirin EC  81 mg Oral Daily  . carvedilol  25 mg Oral BID WC  . diltiazem  180 mg Oral Daily  . docusate sodium  100 mg Oral BID  . Ferrous Gluconate  1 tablet Oral BID  . fluticasone  2 spray Each Nare Daily  . heparin  5,000 Units Subcutaneous Q8H  . hydrALAZINE  50 mg Oral BID  . isosorbide mononitrate  120 mg Oral Daily  . loratadine  10 mg Oral Daily  . mouth rinse  15 mL Mouth Rinse BID  . multivitamin-lutein  1 capsule Oral Daily  . pantoprazole  40 mg Oral Daily  . simvastatin  40 mg Oral Daily   . sodium chloride flush  3 mL Intravenous Q12H   acetaminophen, albuterol, ondansetron (ZOFRAN) IV, polyethylene glycol, traMADol  Assessment/ Plan:  83 y.o. female with hypertension, osteoporosis, GERD, anemia, superventricular tachycardia who presented to [facility] with increasing shortness of breath, acute renal failure, worsening hyponatremia.   1.  Hyponatremia, acute on chronic. 2.  CKD stage IV baseline Cr 1.8 3.  Anemia of CKD, hgb 8.6 4.  Hypertension.  Plan:  Renal function appears to be stable over the past several days with a creatinine of 2.1. Her baseline creatinine as above his 1.8. Recommend continued monitoring of her renal function daily. She may develop a new baseline creatinine.  Patient was previously given tolvaptan to treat her hyponatremia.  Patient has chronic hyponatremia and Na appears to have stabilized.  Continue to monitor serum sodium as well.  Recommended to pt that she drink 1200cc of free water per day.  Dr. Wynelle Link to see tomorrow who usually follows her as an outpatient as well.    LOS: 7 Laura [name] 4/8/20181:26 PM

## 2017-02-26 NOTE — Progress Notes (Signed)
Pt.'s HR continued to run between 80's and 130's. MD aware, no new orders.

## 2017-02-26 NOTE — Progress Notes (Signed)
Patient/family refusing bed and chair alarm while family is at bedside. Educated on safety. Agreed to inform staff when they leave so alarms can be turned on.   Patient ambulated with walker and standby assist from RN in hallway. O2 sats remained greater than 96% on room air. Heart rate continues to fluctuate 110-130s. Up in chair now. Family at bedside.

## 2017-02-26 NOTE — Progress Notes (Signed)
Pt's HR has been running anywhere from 85 to 130's nonsustained.

## 2017-02-26 NOTE — Progress Notes (Signed)
Dr. Elpidio Anis aware of heart rate continuing to fluctuate. Meds adjusted by Dr. Gwen Pounds. Patient asymptomatic. No new orders at this time.

## 2017-02-26 NOTE — Progress Notes (Signed)
[facility] - [place] [facility]   PATIENT NAME: Laura [name]    MR#:  [id number]  DATE OF BIRTH:  03/04/1924 CHIEF COMPLAINT:   Chief Complaint  Patient presents with  . Shortness of Breath   Still has Afib with RVR on tele into 140s Had sinus pauses of up to 2.5 seconds on telemetry.  No shortness of breath. Sitting in a chair. Feels well.   REVIEW OF SYSTEMS:    Review of Systems  Constitutional: Negative for chills and fever.  HENT: Negative for hearing loss.   Eyes: Negative for blurred vision, double vision and photophobia.  Respiratory: Positive for shortness of breath. Negative for cough and hemoptysis.   Cardiovascular: Negative for chest pain, palpitations and orthopnea.  Gastrointestinal: Negative for abdominal pain, diarrhea and vomiting.  Genitourinary: Negative for dysuria and urgency.  Musculoskeletal: Negative for myalgias and neck pain.  Skin: Negative for rash.  Neurological: Negative for dizziness, focal weakness, seizures, weakness and headaches.  Psychiatric/Behavioral: Negative for memory loss. The patient does not have insomnia.    DRUG ALLERGIES:   Allergies  Allergen Reactions  . Penicillins Shortness Of Breath        . Codeine Itching  . Amlodipine Rash    Peripheral edema  . Ciprofloxacin Rash  . Sulfa Antibiotics Rash    VITALS:  Blood pressure 113/86, pulse (!) 123, temperature 97.5 F (36.4 C), resp. rate 18, height  (1.448 m), weight 56.3 kg (124 lb 3 oz), SpO2 97 %.  PHYSICAL EXAMINATION:   Physical Exam  GENERAL:  83 y.o.-year-old patient lying in the bed with no acute distress. Decreased hearing EYES: Pupils equal, round, reactive to light and accommodation. No scleral icterus. Extraocular muscles intact.  HEENT: Head atraumatic, normocephalic. Oropharynx and nasopharynx clear.  NECK:  Supple, no jugular venous distention. No thyroid enlargement, no tenderness.  LUNGS: Bibasilar  crackles CARDIOVASCULAR: S1, S2 irregular. Tachycardic ABDOMEN: Soft, nontender, nondistended. Bowel sounds present. No organomegaly or mass.  EXTREMITIES: 1+ pitting edema bilaterally .cyanosis, or clubbing.  NEUROLOGIC: Cranial nerves II through XII are intact. Muscle strength 5/5 in all extremities. Sensation intact. Gait not checked.  PSYCHIATRIC: The patient is alert and awake SKIN: No obvious rash, lesion, or ulcer.   LABORATORY PANEL:   CBC  Recent Labs Lab 02/23/17 0440 02/26/17 0522  WBC 3.6  --   HGB 8.5* 8.6*  HCT 24.6*  --   PLT 249  --    ------------------------------------------------------------------------------------------------------------------  Chemistries   Recent Labs Lab 02/19/17 1907  02/26/17 0522  NA 125*  < > 129*  K 4.3  < > 5.3*  CL 93*  < > 94*  CO2 23  < > 29  GLUCOSE 102*  < > 102*  BUN 43*  < > 55*  CREATININE 2.28*  < > 2.17*  CALCIUM 8.7*  < > 8.7*  AST 18  --   --   ALT 11*  --   --   ALKPHOS 62  --   --   BILITOT 0.5  --   --   < > = values in this interval not displayed. ------------------------------------------------------------------------------------------------------------------  Cardiac Enzymes  Recent Labs Lab 02/20/17 0819  TROPONINI 0.03*   ------------------------------------------------------------------------------------------------------------------  RADIOLOGY:  No results found.   ASSESSMENT AND PLAN:   Principal Problem:   Acute CHF (HCC) Active Problems:   CHF (congestive heart failure) (HCC)   * Afib with RVR Consulted cardiology regarding recommendations  Increased coreg. Cardizem  180 mg daily. Patient had positive supper 2.5 seconds yesterday. One dose of Coreg held. She continues to be tachycardic. Appreciate cardiology input. Advise continuing Cardizem and Coreg with telemetry monitoring for positive.   # Acute on chronic diastolic heart failure: Lasix on hold because of worsening cr   nephrology consulted due to hyponatremia and CHF [place]a stopped Creatinine has stabilized  # Acute on chronic hyponatremia likely secondary to CHF - Improving Stopped lasix due to worsening Cr Tolvaptan per nephrology  # Chronic  kidney disease stage III Stable  # Acute respiratory failure with hypoxia due to chf  Resolved on room air  # COPD exacerbation: Continue nebulizers PRN Finished prednisone.  # Weakness PT eval  All the records are reviewed and case discussed with Care Management/Social Worker Management plans discussed with the patient, family and they are in agreement.  CODE STATUS: DNR  TOTAL TIME TAKING CARE OF THIS PATIENT: 35 minutes.   POSSIBLE D/C IN 1-2 DAYS, DEPENDING ON CLINICAL CONDITION.  Milagros Loll R M.D on 02/26/2017 at 1:50 PM  Between 7am to 6pm - Pager - [phone]  After 6pm go to [email or web address] - password [email or web address] [facility]  [facility]  Office  [phone]  CC: Primary care physician; Marisue Ivan, MD

## 2017-02-27 LAB — BASIC METABOLIC PANEL
Anion gap: 7 (ref 5–15)
BUN: 61 mg/dL — AB (ref 6–20)
CHLORIDE: 93 mmol/L — AB (ref 101–111)
CO2: 28 mmol/L (ref 22–32)
CREATININE: 2.25 mg/dL — AB (ref 0.44–1.00)
Calcium: 8.2 mg/dL — ABNORMAL LOW (ref 8.9–10.3)
GFR calc Af Amer: 21 mL/min — ABNORMAL LOW (ref >60)
GFR calc non Af Amer: 18 mL/min — ABNORMAL LOW (ref >60)
Glucose, Bld: 87 mg/dL (ref 65–99)
Potassium: 4.7 mmol/L (ref 3.5–5.1)
Sodium: 128 mmol/L — ABNORMAL LOW (ref 135–145)

## 2017-02-27 MED ORDER — LISINOPRIL 5 MG PO TABS
5.0000 mg | ORAL_TABLET | Freq: Every day | ORAL | Status: DC
Start: 2017-02-27 — End: 2017-02-27

## 2017-02-27 MED ORDER — DILTIAZEM HCL ER COATED BEADS 240 MG PO CP24: 240.0000 mg | ORAL_CAPSULE | Freq: Every day | ORAL | 0 refills | Status: AC

## 2017-02-27 MED ORDER — DILTIAZEM HCL ER COATED BEADS 240 MG PO CP24
240.0000 mg | ORAL_CAPSULE | Freq: Every day | ORAL | Status: DC
  Administered 2017-02-27: 240 mg via ORAL
  Filled 2017-02-27: qty 1

## 2017-02-27 MED ORDER — CARVEDILOL 25 MG PO TABS: 25.0000 mg | ORAL_TABLET | Freq: Two times a day (BID) | ORAL | 0 refills | Status: AC

## 2017-02-27 NOTE — Progress Notes (Signed)
Patient given discharge teaching and paperwork regarding medications, diet, follow-up appointments and activity. Patient understanding verbalized. No complaints at this time. IV and telemetry discontinued prior to leaving. Skin assessment as previously charted and vitals are stable; on room air. Patient being discharged to home. Caregiver/family present during discharge teaching. No further needs by Care Management - home health set up. Prescriptions sent to pharmacy.

## 2017-02-27 NOTE — Care Management Important Message (Signed)
Important Message  Patient Details  Name: Laura [name] MRN: [id number] Date of Birth: 1924-09-09   Medicare Important Message Given:  Yes    Eber Hong, RN 02/27/2017, 12:34 PM

## 2017-02-27 NOTE — Progress Notes (Signed)
[facility]  ROUNDING NOTE   Subjective:  Laying in bed.   No complaints.   Objective:  Vital signs in last 24 hours:  Temp:  [97.9 F (36.6 C)-98.9 F (37.2 C)] 98.6 F (37 C) (04/09 0506) Pulse Rate:  [73-145] 86 (04/09 1002) Resp:  [16-20] 16 (04/09 0506) BP: (112-155)/(62-111) 124/86 (04/09 1002) SpO2:  [96 %-97 %] 97 % (04/09 0823) Weight:  [57.5 kg (126 lb 12.8 oz)-57.7 kg (127 lb 4 oz)] 57.5 kg (126 lb 12.8 oz) (04/09 0506)  Weight change: 1.389 kg (3 lb 1 oz) Filed Weights   02/26/17 0445 02/27/17 0454 02/27/17 0506  Weight: 56.3 kg (124 lb 3 oz) 57.7 kg (127 lb 4 oz) 57.5 kg (126 lb 12.8 oz)    Intake/Output: I/O last 3 completed shifts: In: 720 [P.O.:720] Out: 1550 [Urine:1550]   Intake/Output this shift:  No intake/output data recorded.  Physical Exam: General: No acute distress  Head: Normocephalic, atraumatic. Moist oral mucosal membranes  Eyes: Anicteric  Neck: Supple, trachea midline  Lungs:  Clear to auscultation, normal effort  Heart: irregular tachycardic  Abdomen:  Soft, nontender  Extremities: Trace peripheral edema  Neurologic: Awake, alert, following commands  Skin: No lesions       Basic Metabolic Panel:  Recent Labs Lab 02/23/17 0441 02/23/17 1303 02/24/17 0921 02/25/17 0548 02/26/17 0522 02/27/17 0430  NA 125* 122* 128* 130* 129* 128*  K 4.3  --  4.1 4.1 5.3* 4.7  CL 89*  --  93* 94* 94* 93*  CO2 27  --  GLUCOSE 111*  --  167* 89 102* 87  BUN 45*  --  47* 52* 55* 61*  CREATININE 2.14*  --  2.14* 2.10* 2.17* 2.25*  CALCIUM 8.6*  --  8.8* 8.4* 8.7* 8.2*    Liver Function Tests: No results for input(s): AST, ALT, ALKPHOS, BILITOT, PROT, ALBUMIN in the last 168 hours. No results for input(s): LIPASE, AMYLASE in the last 168 hours. No results for input(s): AMMONIA in the last 168 hours.  CBC:  Recent Labs Lab 02/22/17 1207 02/23/17 0440 02/26/17 0522  WBC  --  3.6  --   HGB 7.8* 8.5* 8.6*   HCT 22.8* 24.6*  --   MCV  --  90.8  --   PLT  --  249  --     Cardiac Enzymes: No results for input(s): CKTOTAL, CKMB, CKMBINDEX, TROPONINI in the last 168 hours.  BNP: Invalid input(s): POCBNP  CBG: No results for input(s): GLUCAP in the last 168 hours.  Microbiology: Results for orders placed or performed during the hospital encounter of 02/19/17  MRSA PCR Screening     Status: None   Collection Time: 02/19/17 11:30 PM  Result Value Ref Range Status   MRSA by PCR NEGATIVE NEGATIVE Final    Comment:        The GeneXpert MRSA Assay (FDA approved for NASAL specimens only), is one component of a comprehensive MRSA colonization surveillance program. It is not intended to diagnose MRSA infection nor to guide or monitor treatment for MRSA infections.     Coagulation Studies: No results for input(s): LABPROT, INR in the last 72 hours.  Urinalysis: No results for input(s): COLORURINE, LABSPEC, PHURINE, GLUCOSEU, HGBUR, BILIRUBINUR, KETONESUR, PROTEINUR, UROBILINOGEN, NITRITE, LEUKOCYTESUR in the last 72 hours.  Invalid input(s): APPERANCEUR    Imaging: No results found.   Medications:    . acetaminophen  1,000 mg Oral Daily  .  aspirin EC  81 mg Oral Daily  . carvedilol  25 mg Oral BID WC  . diltiazem  240 mg Oral Daily  . docusate sodium  100 mg Oral BID  . Ferrous Gluconate  1 tablet Oral BID  . fluticasone  2 spray Each Nare Daily  . heparin  5,000 Units Subcutaneous Q8H  . hydrALAZINE  50 mg Oral BID  . isosorbide mononitrate  120 mg Oral Daily  . loratadine  10 mg Oral Daily  . mouth rinse  15 mL Mouth Rinse BID  . multivitamin-lutein  1 capsule Oral Daily  . pantoprazole  40 mg Oral Daily  . simvastatin  40 mg Oral Daily  . sodium chloride flush  3 mL Intravenous Q12H   acetaminophen, albuterol, ondansetron (ZOFRAN) IV, polyethylene glycol, traMADol  Assessment/ Plan:  83 y.o.white female with hypertension, osteoporosis, GERD, anemia,  superventricular tachycardia who presented to [facility] with increasing shortness of breath, acute renal failure, worsening hyponatremia.  1.  Hyponatremia, acute on chronic. Hyponatremia secondary to chronic kidney disease and congestive heart failure.  - Continue fluid restriction  2.  Chronic Kidney Disease stage IV with proteinuria: creatinine at baseline: 1.8-2.2. No indication for dialysis.   3.  Anemia of chronic kidney disease: hemoglobin 8.6. Has not received epo during this admission.   4.  Hypertension with atrial fibrillation with RVR:  - carvedilol, diltiazem, hydralazine, imdur - restart lisinopril   LOS: 8 Laura [name] 4/9/201810:08 AM

## 2017-02-27 NOTE — Discharge Summary (Signed)
[facility] - [place] at [facility]   PATIENT NAME: Laura [name]    MR#:  [id number]  DATE OF BIRTH:  14-Nov-1924  DATE OF ADMISSION:  02/19/2017 ADMITTING PHYSICIAN: Altamese Dilling, MD  DATE OF DISCHARGE: 02/27/2017  PRIMARY CARE PHYSICIAN: Marisue Ivan, MD    ADMISSION DIAGNOSIS:  CHF (congestive heart failure) (HCC) [I50.9] Acute on chronic congestive heart failure, unspecified congestive heart failure type (HCC) [I50.9]  DISCHARGE DIAGNOSIS:  Principal Problem:   Acute CHF (HCC) Active Problems:   CHF (congestive heart failure) (HCC)   SECONDARY DIAGNOSIS:   Past Medical History:  Diagnosis Date  . CHF (congestive heart failure) (HCC)   . Chronic kidney disease   . High cholesterol   . Hypertension     HOSPITAL COURSE:   83 year old female with a history of chronic atrial fibrillation who presented with atrial fibrillation with RVR.  1. Atrial fibrillation with RVR: Patient's heart rates still fluctuate. As an outpatient patient has similar issues. She is completely asymptomatic. I increased diltiazem. She will continue Coreg. She will follow up with cardiology.   2. Essential hypertension: Patient will continue diltiazem, Coreg, hydralazine and lisinopril  3. Chronic kidney disease stage 4 with baseline creatinine 1.8 and now baseline creatinine 2.1  Discussed case with nephrologist.  Plan for outpatient follow-up   4. Acute on chronic hyponatremia which has stabalized Patient will continue on fluid restriction 1200 cc free water per day.   5. Anemia of chronic kidney disease with stable hemoglobin   6. COPD exacerbation: This has improved and she has completed prednisone  7. Acute respiratory failure with hypoxia due to COPD exacerbation which is resolved    DISCHARGE CONDITIONS AND DIET:  Stable Cardiac diet  CONSULTS OBTAINED:  Treatment Team:  Mady Haagensen, MD Lamar Blinks, MD  DRUG ALLERGIES:    Allergies  Allergen Reactions  . Penicillins Shortness Of Breath        . Codeine Itching  . Amlodipine Rash    Peripheral edema  . Ciprofloxacin Rash  . Sulfa Antibiotics Rash    DISCHARGE MEDICATIONS:   Current Discharge Medication List    START taking these medications   Details  diltiazem (CARDIZEM CD) 240 MG 24 hr capsule Take 1 capsule (240 mg total) by mouth daily. Qty: 30 capsule, Refills: 0      CONTINUE these medications which have CHANGED   Details  carvedilol (COREG) 25 MG tablet Take 1 tablet (25 mg total) by mouth 2 (two) times daily with a meal. Qty: 60 tablet, Refills: 0      CONTINUE these medications which have NOT CHANGED   Details  acetaminophen (TYLENOL) 500 MG tablet Take 1,000 mg by mouth daily. Take 2 tabs ( ) by mouth every morning.  Take 2 tablets in evening if needed.    aspirin EC 81 MG tablet Take 81 mg by mouth daily.    docusate sodium (COLACE) 100 MG capsule Take 100 mg by mouth 2 (two) times daily.    Fe Fum-Vit C-Vit B12-FA (TRIGELS-F FORTE) 460-60-0.01-1 MG CAPS capsule Take 1 tablet by mouth daily.    Ferrous Gluconate 324 (37.5 Fe) MG TABS Take 1 tablet by mouth 2 (two) times daily.    fluticasone (FLONASE) 50 MCG/ACT nasal spray Place 2 sprays into both nostrils daily.    hydrALAZINE (APRESOLINE) 100 MG tablet Take 100 mg by mouth 2 (two) times daily.    isosorbide mononitrate (IMDUR) 120 MG 24 hr tablet Take 1 tablet by  mouth daily.    lisinopril (PRINIVIL,ZESTRIL) 10 MG tablet Take 10 mg by mouth daily.    Lutein 20 MG TABS Take 1 tablet by mouth daily.    pantoprazole (PROTONIX) 40 MG tablet Take 40 mg by mouth daily.    simvastatin (ZOCOR) 40 MG tablet Take 40 mg by mouth daily.    torsemide (DEMADEX) 20 MG tablet Take 20 mg by mouth daily.     traMADol (ULTRAM) 50 MG tablet Take 50 mg by mouth every 6 (six) hours as needed for pain.      STOP taking these medications     NIFEdipine  (PROCARDIA-XL/ADALAT-CC/NIFEDICAL-XL) 30 MG 24 hr tablet           Today   CHIEF COMPLAINT:  Doing well no symptoms   VITAL SIGNS:  Blood pressure 109/67, pulse (!) 129, temperature 98.6 F (37 C), temperature source Oral, resp. rate 16, height  (1.448 m), weight 57.5 kg (126 lb 12.8 oz), SpO2 97 %.   REVIEW OF SYSTEMS:  Review of Systems  Constitutional: Negative.  Negative for chills, fever and malaise/fatigue.  HENT: Negative.  Negative for ear discharge, ear pain, hearing loss, nosebleeds and sore throat.   Eyes: Negative.  Negative for blurred vision and pain.  Respiratory: Negative.  Negative for cough, hemoptysis, shortness of breath and wheezing.   Cardiovascular: Negative.  Negative for chest pain, palpitations and leg swelling.  Gastrointestinal: Negative.  Negative for abdominal pain, blood in stool, diarrhea, nausea and vomiting.  Genitourinary: Negative.  Negative for dysuria.  Musculoskeletal: Negative.  Negative for back pain.  Skin: Negative.   Neurological: Negative for dizziness, tremors, speech change, focal weakness, seizures and headaches.  Endo/Heme/Allergies: Negative.  Does not bruise/bleed easily.  Psychiatric/Behavioral: Negative.  Negative for depression, hallucinations and suicidal ideas.     PHYSICAL EXAMINATION:  GENERAL:  83 y.o.-year-old patient lying in the bed with no acute distress.  NECK:  Supple, no jugular venous distention. No thyroid enlargement, no tenderness.  LUNGS: Normal breath sounds bilaterally, no wheezing, rales,rhonchi  No use of accessory muscles of respiration.  CARDIOVASCULAR: irr, irr tachy NO, rubs, or gallops.  ABDOMEN: Soft, non-tender, non-distended. Bowel sounds present. No organomegaly or mass.  EXTREMITIES: No pedal edema, cyanosis, or clubbing.  PSYCHIATRIC: The patient is alert and oriented x 3.  SKIN: No obvious rash, lesion, or ulcer.   DATA REVIEW:   CBC  Recent Labs Lab 02/23/17 0440  02/26/17 0522  WBC 3.6  --   HGB 8.5* 8.6*  HCT 24.6*  --   PLT 249  --     Chemistries   Recent Labs Lab 02/27/17 0430  NA 128*  K 4.7  CL 93*  CO2 28  GLUCOSE 87  BUN 61*  CREATININE 2.25*  CALCIUM 8.2*    Cardiac Enzymes No results for input(s): TROPONINI in the last 168 hours.  Microbiology Results  @  RADIOLOGY:  No results found.    Current Discharge Medication List    START taking these medications   Details  diltiazem (CARDIZEM CD) 240 MG 24 hr capsule Take 1 capsule (240 mg total) by mouth daily. Qty: 30 capsule, Refills: 0      CONTINUE these medications which have CHANGED   Details  carvedilol (COREG) 25 MG tablet Take 1 tablet (25 mg total) by mouth 2 (two) times daily with a meal. Qty: 60 tablet, Refills: 0      CONTINUE these medications which have NOT CHANGED   Details  acetaminophen (TYLENOL) 500 MG tablet Take 1,000 mg by mouth daily. Take 2 tabs ( ) by mouth every morning.  Take 2 tablets in evening if needed.    aspirin EC 81 MG tablet Take 81 mg by mouth daily.    docusate sodium (COLACE) 100 MG capsule Take 100 mg by mouth 2 (two) times daily.    Fe Fum-Vit C-Vit B12-FA (TRIGELS-F FORTE) 460-60-0.01-1 MG CAPS capsule Take 1 tablet by mouth daily.    Ferrous Gluconate 324 (37.5 Fe) MG TABS Take 1 tablet by mouth 2 (two) times daily.    fluticasone (FLONASE) 50 MCG/ACT nasal spray Place 2 sprays into both nostrils daily.    hydrALAZINE (APRESOLINE) 100 MG tablet Take 100 mg by mouth 2 (two) times daily.    isosorbide mononitrate (IMDUR) 120 MG 24 hr tablet Take 1 tablet by mouth daily.    lisinopril (PRINIVIL,ZESTRIL) 10 MG tablet Take 10 mg by mouth daily.    Lutein 20 MG TABS Take 1 tablet by mouth daily.    pantoprazole (PROTONIX) 40 MG tablet Take 40 mg by mouth daily.    simvastatin (ZOCOR) 40 MG tablet Take 40 mg by mouth daily.    torsemide (DEMADEX) 20 MG tablet Take 20 mg by mouth daily.      traMADol (ULTRAM) 50 MG tablet Take 50 mg by mouth every 6 (six) hours as needed for pain.      STOP taking these medications     NIFEdipine (PROCARDIA-XL/ADALAT-CC/NIFEDICAL-XL) 30 MG 24 hr tablet           Management plans discussed with the patient and she is in agreement. Stable for discharge   Patient should follow up with cardiology  CODE STATUS:     Code Status Orders        Start     Ordered   02/19/17 2307  Do not attempt resuscitation (DNR)  Continuous    Question Answer Comment  In the event of cardiac or respiratory ARREST Do not call a "code blue"   In the event of cardiac or respiratory ARREST Do not perform Intubation, CPR, defibrillation or ACLS   In the event of cardiac or respiratory ARREST Use medication by any route, position, wound care, and other measures to relive pain and suffering. May use oxygen, suction and manual treatment of airway obstruction as needed for comfort.      02/19/17 2306    Code Status History    Date Active Date Inactive Code Status Order ID Comments User Context   This patient has a current code status but no historical code status.    Advance Directive Documentation     Most Recent Value  Type of Advance Directive  Living will  Pre-existing out of facility DNR order (yellow form or pink MOST form)  -  "MOST" Form in Place?  -      TOTAL TIME TAKING CARE OF THIS PATIENT: 37 minutes.    Note: This dictation was prepared with Dragon dictation along with smaller phrase technology. Any transcriptional errors that result from this process are unintentional.  Kaly Mcquary M.D on 02/27/2017 at 1:00 PM  Between 7am to 6pm - Pager - [phone] After 6pm go to [email or web address] - Social research officer, government  [facility]  Office  [phone]  CC: Primary care physician; Marisue Ivan, MD

## 2017-02-27 NOTE — Progress Notes (Signed)
[facility] - [place] at [facility]   PATIENT NAME: Laura [name]    MR#:  [id number]  DATE OF BIRTH:  11/03/1924  SUBJECTIVE:  Doing well  Denies chest pain, palpitations or shortness of breath  REVIEW OF SYSTEMS:    Review of Systems  Constitutional: Negative.  Negative for chills, fever and malaise/fatigue.  HENT: Negative.  Negative for ear discharge, ear pain, hearing loss, nosebleeds and sore throat.   Eyes: Negative.  Negative for blurred vision and pain.  Respiratory: Negative.  Negative for cough, hemoptysis, shortness of breath and wheezing.   Cardiovascular: Negative.  Negative for chest pain, palpitations and leg swelling.  Gastrointestinal: Negative.  Negative for abdominal pain, blood in stool, diarrhea, nausea and vomiting.  Genitourinary: Negative.  Negative for dysuria.  Musculoskeletal: Negative.  Negative for back pain.  Skin: Negative.   Neurological: Negative for dizziness, tremors, speech change, focal weakness, seizures and headaches.  Endo/Heme/Allergies: Negative.  Does not bruise/bleed easily.  Psychiatric/Behavioral: Negative.  Negative for depression, hallucinations and suicidal ideas.    Tolerating Diet: yes      DRUG ALLERGIES:   Allergies  Allergen Reactions  . Penicillins Shortness Of Breath        . Codeine Itching  . Amlodipine Rash    Peripheral edema  . Ciprofloxacin Rash  . Sulfa Antibiotics Rash    VITALS:  Blood pressure 124/86, pulse 86, temperature 98.6 F (37 C), temperature source Oral, resp. rate 16, height  (1.448 m), weight 57.5 kg (126 lb 12.8 oz), SpO2 97 %.  PHYSICAL EXAMINATION:   Physical Exam  Constitutional: She is oriented to person, place, and time and well-developed, well-nourished, and in no distress. No distress.  HENT:  Head: Normocephalic.  Eyes: No scleral icterus.  Neck: Normal range of motion. Neck supple. No JVD present. No tracheal deviation present.  Cardiovascular:  Normal rate, regular rhythm and normal heart sounds.  Exam reveals no gallop and no friction rub.   No murmur heard. irr, irr tachycardia  Pulmonary/Chest: Effort normal and breath sounds normal. No respiratory distress. She has no wheezes. She has no rales. She exhibits no tenderness.  Abdominal: Soft. Bowel sounds are normal. She exhibits no distension and no mass. There is no tenderness. There is no rebound and no guarding.  Musculoskeletal: Normal range of motion. She exhibits no edema.  Neurological: She is alert and oriented to person, place, and time.  Skin: Skin is warm. No rash noted. No erythema.  Psychiatric: Affect and judgment normal.      LABORATORY PANEL:   CBC  Recent Labs Lab 02/23/17 0440 02/26/17 0522  WBC 3.6  --   HGB 8.5* 8.6*  HCT 24.6*  --   PLT 249  --    ------------------------------------------------------------------------------------------------------------------  Chemistries   Recent Labs Lab 02/27/17 0430  NA 128*  K 4.7  CL 93*  CO2 28  GLUCOSE 87  BUN 61*  CREATININE 2.25*  CALCIUM 8.2*   ------------------------------------------------------------------------------------------------------------------  Cardiac Enzymes No results for input(s): TROPONINI in the last 168 hours. ------------------------------------------------------------------------------------------------------------------  RADIOLOGY:  No results found.   ASSESSMENT AND PLAN:   83 year old female with a history of chronic atrial fibrillation who presented with atrial fibrillation with RVR.  1. Atrial fibrillation with RVR: Patient's heart rates still fluctuate. As an outpatient patient has similar issues. She is completely asymptomatic. I increased diltiazem. She will continue Coreg. She will follow up with cardiology.   2. Essential hypertension: Patient will continue diltiazem, Coreg,  hydralazine and lisinopril  3. Chronic kidney disease stage 4 with  baseline creatinine 1.8 and now baseline creatinine 2.1  Discussed case with nephrologist.  Plan for outpatient follow-up   4. Acute on chronic hyponatremia which has stabalized Patient will continue on fluid restriction 1200 cc free water per day.   5. Anemia of chronic kidney disease with stable hemoglobin   6. COPD exacerbation: This has improved and she has completed prednisone  7. Acute respiratory failure with hypoxia due to COPD exacerbation which is resolved  Management plans discussed with the patient and she is in agreement.  CODE STATUS: DNR  TOTAL TIME TAKING CARE OF THIS PATIENT: 27 minutes.     POSSIBLE D/C today, DEPENDING ON CLINICAL CONDITION.   Leondro Coryell M.D on 02/27/2017 at 10:16 AM  Between 7am to 6pm - Pager - [phone] After 6pm go to [email or web address] - password [email or web address]  [facility]  Office  [phone]  CC: Primary care physician; Marisue Ivan, MD  Note: This dictation was prepared with Dragon dictation along with smaller phrase technology. Any transcriptional errors that result from this process are unintentional.

## 2017-02-27 NOTE — Care Management (Signed)
Notified [facility] of discharge. Agency will see patient 4.10.2018.  Daughter to transport home

## 2017-02-28 ENCOUNTER — Encounter: Payer: Self-pay | Admitting: *Deleted

## 2017-02-28 ENCOUNTER — Other Ambulatory Visit: Payer: Self-pay | Admitting: *Deleted

## 2017-02-28 DIAGNOSIS — N184 Chronic kidney disease, stage 4 (severe): Secondary | ICD-10-CM | POA: Diagnosis not present

## 2017-02-28 DIAGNOSIS — Z87440 Personal history of urinary (tract) infections: Secondary | ICD-10-CM | POA: Diagnosis not present

## 2017-02-28 DIAGNOSIS — I5020 Unspecified systolic (congestive) heart failure: Secondary | ICD-10-CM | POA: Diagnosis not present

## 2017-02-28 DIAGNOSIS — I130 Hypertensive heart and chronic kidney disease with heart failure and stage 1 through stage 4 chronic kidney disease, or unspecified chronic kidney disease: Secondary | ICD-10-CM | POA: Diagnosis not present

## 2017-02-28 DIAGNOSIS — D631 Anemia in chronic kidney disease: Secondary | ICD-10-CM | POA: Diagnosis not present

## 2017-02-28 DIAGNOSIS — Z8701 Personal history of pneumonia (recurrent): Secondary | ICD-10-CM | POA: Diagnosis not present

## 2017-02-28 DIAGNOSIS — Z7951 Long term (current) use of inhaled steroids: Secondary | ICD-10-CM | POA: Diagnosis not present

## 2017-02-28 DIAGNOSIS — I4891 Unspecified atrial fibrillation: Secondary | ICD-10-CM | POA: Diagnosis not present

## 2017-02-28 DIAGNOSIS — M810 Age-related osteoporosis without current pathological fracture: Secondary | ICD-10-CM | POA: Diagnosis not present

## 2017-02-28 DIAGNOSIS — Z7982 Long term (current) use of aspirin: Secondary | ICD-10-CM | POA: Diagnosis not present

## 2017-02-28 DIAGNOSIS — E7800 Pure hypercholesterolemia, unspecified: Secondary | ICD-10-CM | POA: Diagnosis not present

## 2017-02-28 NOTE — Patient Outreach (Signed)
Transition of care call successful, follow up on referral received from [facility] hospital liaison Janci RN to follow pt for transition of care(recent hospitalization 4/1-4/9 for CHF.   Spoke with pt, HIPAA/identity verified, discussed purpose of call to follow for transition of care/recent hospital discharge.  Pt reports doing good since discharge home,lives alone but son staying  with her for a few days, also have daughters who live close by who can assist if needed. Pt reports uses a rollator to get around.  Pt reports HH RN came today, went over her discharge instructions, when to call MD, provided a scale to use, to start weighing tomorrow.  Pt reports no sob, edema, to see Cardiologist the end of this week and Primary Care MD in June.  Pt reports does her own medications, aware of medication changes post discharge, unable to review medications at this time as daughter has her discharge papers.  RN CM discussed with pt importance of recording weights, call MD for a weight gain of >2 lbs in a day, 5 lbs in a week to which pt voiced understanding.  Also discussed [facility] transition of care program- follow for 31 days post discharge (weekly calls, a home visit)no cost to her.   Plan: As discussed with pt, plan to follow up again next week- initial home visit.          Barrier letter sent to Dr. Burnadette Pop informing of [facility] involvement.   Shayne Alken.   Pierzchala RN CCM [facility] Care Management  [phone]

## 2017-03-01 DIAGNOSIS — N184 Chronic kidney disease, stage 4 (severe): Secondary | ICD-10-CM | POA: Diagnosis not present

## 2017-03-01 DIAGNOSIS — I5020 Unspecified systolic (congestive) heart failure: Secondary | ICD-10-CM | POA: Diagnosis not present

## 2017-03-01 DIAGNOSIS — D631 Anemia in chronic kidney disease: Secondary | ICD-10-CM | POA: Diagnosis not present

## 2017-03-01 DIAGNOSIS — E7800 Pure hypercholesterolemia, unspecified: Secondary | ICD-10-CM | POA: Diagnosis not present

## 2017-03-01 DIAGNOSIS — I130 Hypertensive heart and chronic kidney disease with heart failure and stage 1 through stage 4 chronic kidney disease, or unspecified chronic kidney disease: Secondary | ICD-10-CM | POA: Diagnosis not present

## 2017-03-01 DIAGNOSIS — I4891 Unspecified atrial fibrillation: Secondary | ICD-10-CM | POA: Diagnosis not present

## 2017-03-02 ENCOUNTER — Encounter: Payer: Self-pay | Admitting: Family

## 2017-03-02 ENCOUNTER — Ambulatory Visit: Payer: Medicare Other | Attending: Family | Admitting: Family

## 2017-03-02 VITALS — BP 128/86 | HR 113 | Resp 18 | Ht <= 58 in | Wt 128.2 lb

## 2017-03-02 DIAGNOSIS — Z90711 Acquired absence of uterus with remaining cervical stump: Secondary | ICD-10-CM | POA: Insufficient documentation

## 2017-03-02 DIAGNOSIS — Z885 Allergy status to narcotic agent status: Secondary | ICD-10-CM | POA: Insufficient documentation

## 2017-03-02 DIAGNOSIS — Z7982 Long term (current) use of aspirin: Secondary | ICD-10-CM | POA: Diagnosis not present

## 2017-03-02 DIAGNOSIS — Z833 Family history of diabetes mellitus: Secondary | ICD-10-CM | POA: Insufficient documentation

## 2017-03-02 DIAGNOSIS — Z96653 Presence of artificial knee joint, bilateral: Secondary | ICD-10-CM | POA: Insufficient documentation

## 2017-03-02 DIAGNOSIS — E871 Hypo-osmolality and hyponatremia: Secondary | ICD-10-CM

## 2017-03-02 DIAGNOSIS — I471 Supraventricular tachycardia: Secondary | ICD-10-CM | POA: Insufficient documentation

## 2017-03-02 DIAGNOSIS — I480 Paroxysmal atrial fibrillation: Secondary | ICD-10-CM

## 2017-03-02 DIAGNOSIS — D631 Anemia in chronic kidney disease: Secondary | ICD-10-CM | POA: Insufficient documentation

## 2017-03-02 DIAGNOSIS — Z881 Allergy status to other antibiotic agents status: Secondary | ICD-10-CM | POA: Insufficient documentation

## 2017-03-02 DIAGNOSIS — Z888 Allergy status to other drugs, medicaments and biological substances status: Secondary | ICD-10-CM | POA: Diagnosis not present

## 2017-03-02 DIAGNOSIS — I2510 Atherosclerotic heart disease of native coronary artery without angina pectoris: Secondary | ICD-10-CM | POA: Diagnosis not present

## 2017-03-02 DIAGNOSIS — I130 Hypertensive heart and chronic kidney disease with heart failure and stage 1 through stage 4 chronic kidney disease, or unspecified chronic kidney disease: Secondary | ICD-10-CM | POA: Diagnosis not present

## 2017-03-02 DIAGNOSIS — H3530 Unspecified macular degeneration: Secondary | ICD-10-CM | POA: Diagnosis not present

## 2017-03-02 DIAGNOSIS — Z800 Family history of malignant neoplasm of digestive organs: Secondary | ICD-10-CM | POA: Insufficient documentation

## 2017-03-02 DIAGNOSIS — Z882 Allergy status to sulfonamides status: Secondary | ICD-10-CM | POA: Insufficient documentation

## 2017-03-02 DIAGNOSIS — K219 Gastro-esophageal reflux disease without esophagitis: Secondary | ICD-10-CM | POA: Insufficient documentation

## 2017-03-02 DIAGNOSIS — I4891 Unspecified atrial fibrillation: Secondary | ICD-10-CM | POA: Diagnosis not present

## 2017-03-02 DIAGNOSIS — E7800 Pure hypercholesterolemia, unspecified: Secondary | ICD-10-CM | POA: Insufficient documentation

## 2017-03-02 DIAGNOSIS — I10 Essential (primary) hypertension: Secondary | ICD-10-CM

## 2017-03-02 DIAGNOSIS — R0609 Other forms of dyspnea: Secondary | ICD-10-CM | POA: Diagnosis not present

## 2017-03-02 DIAGNOSIS — Z96659 Presence of unspecified artificial knee joint: Secondary | ICD-10-CM | POA: Insufficient documentation

## 2017-03-02 DIAGNOSIS — Z8249 Family history of ischemic heart disease and other diseases of the circulatory system: Secondary | ICD-10-CM | POA: Diagnosis not present

## 2017-03-02 DIAGNOSIS — I509 Heart failure, unspecified: Secondary | ICD-10-CM | POA: Diagnosis not present

## 2017-03-02 DIAGNOSIS — N183 Chronic kidney disease, stage 3 (moderate): Secondary | ICD-10-CM | POA: Diagnosis not present

## 2017-03-02 DIAGNOSIS — R002 Palpitations: Secondary | ICD-10-CM | POA: Diagnosis not present

## 2017-03-02 DIAGNOSIS — N189 Chronic kidney disease, unspecified: Secondary | ICD-10-CM | POA: Diagnosis not present

## 2017-03-02 DIAGNOSIS — I5022 Chronic systolic (congestive) heart failure: Secondary | ICD-10-CM | POA: Insufficient documentation

## 2017-03-02 DIAGNOSIS — Z880 Allergy status to penicillin: Secondary | ICD-10-CM | POA: Insufficient documentation

## 2017-03-02 DIAGNOSIS — R0602 Shortness of breath: Secondary | ICD-10-CM | POA: Diagnosis not present

## 2017-03-02 NOTE — Progress Notes (Signed)
Patient ID: Laura [name], female    DOB: Mar 27, 1924, 83 y.o.   MRN: [id number]  HPI Laura [name] is a 83 y/o female with a history of macular degeneration, anemia, hyponatremia, SVT, GERD, GI bleed, paroxymal atrial fibrillation, CKD, hyperlipidemia, HTN and chronic heart failure.  Last echo was done 02/20/17 and showed mild/mod MR. No EF reported but cardiologist said it was systolic in nature.   Admitted 02/19/17 with HF exacerbation and atrial fibrillation. Cardiology consult obtained and medications were adjusted. Hyponatremia prompted fluid restriction of 1200 cc free water daily. Was in the ED 01/09/17 for SVT. Treated and released.   She presents today for her initial visit with a chief complaint of mild shortness of breath. She says that this has been present for months and occurs when she has to walk long distances. Resolves quickly at rest. Has associated fatigue with this.   Past Medical History:  Diagnosis Date  . Anemia   . Arrhythmia    atrial fibrillation  . CHF (congestive heart failure) (HCC)   . Chronic kidney disease   . GERD (gastroesophageal reflux disease)   . GI bleed   . High cholesterol   . Hypertension   . Hyponatremia   . Macular degeneration   . SVT (supraventricular tachycardia) (HCC)    Past Surgical History:  Procedure Laterality Date  . REPLACEMENT TOTAL KNEE BILATERAL    . VAGINAL HYSTERECTOMY     Partial   Family History  Problem Relation Age of Onset  . CAD Father   . Pancreatic cancer Son   . Heart failure Sister   . Diabetes Brother   . Heart failure Sister    Social History  Substance Use Topics  . Smoking status: Never Smoker  . Smokeless tobacco: Never Used  . Alcohol use No   Allergies  Allergen Reactions  . Penicillins Shortness Of Breath        . Codeine Itching  . Amlodipine Rash    Peripheral edema  . Ciprofloxacin Rash  . Sulfa Antibiotics Rash   Prior to Admission medications   Medication Sig Start Date End Date  Taking? Authorizing Provider  acetaminophen (TYLENOL) 500 MG tablet Take 1,000 mg by mouth daily. Take 2 tabs ( ) by mouth every morning.  Take 2 tablets in evening if needed.   Yes Historical Provider, MD  aspirin EC 81 MG tablet Take 81 mg by mouth daily.   Yes Historical Provider, MD  carvedilol (COREG) 25 MG tablet Take 1 tablet (25 mg total) by mouth 2 (two) times daily with a meal. 02/27/17  Yes Sital Mody, MD  diltiazem (CARDIZEM CD) 240 MG 24 hr capsule Take 1 capsule (240 mg total) by mouth daily. 02/28/17  Yes Adrian Saran, MD  docusate sodium (COLACE) 100 MG capsule Take 100 mg by mouth 2 (two) times daily. 01/02/17  Yes Historical Provider, MD  Fe Fum-Vit C-Vit B12-FA (TRIGELS-F FORTE) 460-60-0.01-1 MG CAPS capsule Take 1 tablet by mouth daily.   Yes Historical Provider, MD  Ferrous Gluconate 324 (37.5 Fe) MG TABS Take 1 tablet by mouth 2 (two) times daily. 11/15/16  Yes Historical Provider, MD  fluticasone (FLONASE) 50 MCG/ACT nasal spray Place 2 sprays into both nostrils daily.   Yes Historical Provider, MD  hydrALAZINE (APRESOLINE) 100 MG tablet Take 100 mg by mouth 2 (two) times daily. 12/23/16  Yes Historical Provider, MD  isosorbide mononitrate (IMDUR) 120 MG 24 hr tablet Take 1 tablet by mouth daily. 12/23/16  Yes  Historical Provider, MD  lisinopril (PRINIVIL,ZESTRIL) 10 MG tablet Take 10 mg by mouth daily. 11/15/16  Yes Historical Provider, MD  Lutein 20 MG TABS Take 1 tablet by mouth daily.   Yes Historical Provider, MD  pantoprazole (PROTONIX) 40 MG tablet Take 40 mg by mouth daily. 12/23/16  Yes Historical Provider, MD  simvastatin (ZOCOR) 40 MG tablet Take 40 mg by mouth daily. 12/23/16  Yes Historical Provider, MD  torsemide (DEMADEX) 20 MG tablet Take 20 mg by mouth daily.    Yes Historical Provider, MD  traMADol (ULTRAM) 50 MG tablet Take 50 mg by mouth every 6 (six) hours as needed for pain.   Yes Historical Provider, MD   Review of Systems  Constitutional: Positive for fatigue  (improving). Negative for appetite change.  HENT: Positive for congestion. Negative for postnasal drip and sore throat.   Eyes: Positive for visual disturbance.  Respiratory: Positive for shortness of breath (when walking long distances). Negative for cough and chest tightness.   Cardiovascular: Negative for chest pain, palpitations and leg swelling.  Gastrointestinal: Negative for abdominal distention and abdominal pain.  Endocrine: Negative.   Genitourinary: Negative.   Musculoskeletal: Negative for back pain and neck pain.  Skin: Negative.   Allergic/Immunologic: Negative.   Neurological: Negative for dizziness and light-headedness.  Hematological: Negative for adenopathy. Does not bruise/bleed easily.  Psychiatric/Behavioral: Negative for dysphoric mood and sleep disturbance (sleeping on 2 pillows). The patient is not nervous/anxious.    Vitals:   03/02/17 1246  BP: 128/86  Pulse: (!) 113  Resp: 18  SpO2: 99%  Weight: 128 lb 4 oz (58.2 kg)  Height:  (1.448 m)   Wt Readings from Last 3 Encounters:  03/02/17 128 lb 4 oz (58.2 kg)  02/27/17 126 lb 12.8 oz (57.5 kg)  01/09/17 126 lb (57.2 kg)   Lab Results  Component Value Date   CREATININE 2.25 (H) 02/27/2017   CREATININE 2.17 (H) 02/26/2017   CREATININE 2.10 (H) 02/25/2017   Physical Exam  Constitutional: She is oriented to person, place, and time. She appears well-developed and well-nourished.  HENT:  Head: Normocephalic and atraumatic.  Eyes: Conjunctivae are normal.  Neck: Normal range of motion. Neck supple. No JVD present.  Cardiovascular: An irregular rhythm present. Tachycardia present.   Pulmonary/Chest: Effort normal. She has no wheezes. She has no rales.  Abdominal: Soft. She exhibits no distension. There is no tenderness.  Musculoskeletal: She exhibits no edema or tenderness.  Neurological: She is alert and oriented to person, place, and time.  Skin: Skin is warm and dry.  Psychiatric: She has a normal  mood and affect. Her behavior is normal.  Nursing note and vitals reviewed.    Assessment & Plan:  1: Chronic heart failure with reduced ejection fraction- - NYHA class II - euvolemic today - not weighing daily as she can't see the numbers on the scale due to her macular degeneration. Son says they weigh her when they can. Will see if the charitable foundation can assist with obtaining a talking scale so that she can weigh daily - not adding extra salt. Does live at [facility] and eats some meals there but she says that the food doesn't taste salty - saw cardiologist Juliann Pares) today and returns to him 09/06/17 - on lisinopril and could consider changing to losartan  2: Atrial fibrillation- - taking aspirin, carvedilol, diltiazem at this time - no other anticoagulation at this time due to history of GI bleed along with age  3: HTN- - BP looks good today -continue hydralazine, lisinopril and torsemide - sees PCP Cordelia Poche) 04/26/17  4: Hyponatremia- -BMP done 02/27/17 reviewed and shows a sodium level of 128, potassium 4.7 and GFR 18. -continue 1200 cc free water fluid restriction  Medication list was reviewed.  Return here in 1 month or sooner for any questions/problems before then.

## 2017-03-02 NOTE — Patient Instructions (Signed)
Begin weighing daily and call for an overnight weight gain of > 2 pounds or a weekly weight gain of >5 pounds. 

## 2017-03-04 ENCOUNTER — Encounter: Payer: Self-pay | Admitting: Family

## 2017-03-04 DIAGNOSIS — E871 Hypo-osmolality and hyponatremia: Secondary | ICD-10-CM | POA: Insufficient documentation

## 2017-03-04 DIAGNOSIS — I10 Essential (primary) hypertension: Secondary | ICD-10-CM | POA: Insufficient documentation

## 2017-03-04 DIAGNOSIS — I4891 Unspecified atrial fibrillation: Secondary | ICD-10-CM | POA: Insufficient documentation

## 2017-03-07 ENCOUNTER — Other Ambulatory Visit: Payer: Self-pay | Admitting: *Deleted

## 2017-03-07 ENCOUNTER — Encounter: Payer: Self-pay | Admitting: *Deleted

## 2017-03-07 DIAGNOSIS — I130 Hypertensive heart and chronic kidney disease with heart failure and stage 1 through stage 4 chronic kidney disease, or unspecified chronic kidney disease: Secondary | ICD-10-CM | POA: Diagnosis not present

## 2017-03-07 DIAGNOSIS — D631 Anemia in chronic kidney disease: Secondary | ICD-10-CM | POA: Diagnosis not present

## 2017-03-07 DIAGNOSIS — E7800 Pure hypercholesterolemia, unspecified: Secondary | ICD-10-CM | POA: Diagnosis not present

## 2017-03-07 DIAGNOSIS — I4891 Unspecified atrial fibrillation: Secondary | ICD-10-CM | POA: Diagnosis not present

## 2017-03-07 DIAGNOSIS — I5020 Unspecified systolic (congestive) heart failure: Secondary | ICD-10-CM | POA: Diagnosis not present

## 2017-03-07 DIAGNOSIS — N184 Chronic kidney disease, stage 4 (severe): Secondary | ICD-10-CM | POA: Diagnosis not present

## 2017-03-07 NOTE — Patient Outreach (Signed)
[facility]) Care Management   03/07/2017  Laura [name] January 11, 1924 [id number]  Laura [name] is an 83 y.o. female  Subjective:  Pt reports getting stronger a little bit every day, has a little sob when walking.  Pt  Reports has  moderate loss of appetite, eating 3 meals a day/one meal  provided by [facility]. Pt reports weight today was 125 lbs, thinks that is right as difficult to see with macular  Degeneration.  Pt reports on  recent visit at HF clinic, NP to see about getting her a talking scale. Pt reports daughter Laura [name] does her pill planner plus she has someone stay with her 2 hours  A day/5 days a week.   Pt reports HH PT recently started, to come 2 times a week, provided her  With daily  home exercises.     Objective:   Vitals:   03/07/17 1335  BP: 110/80  Pulse: (!) 109  Resp: 16    ROS  Physical Exam  Constitutional: She is oriented to person, place, and time. She appears well-developed and well-nourished.  Cardiovascular:  Irregular, elevated 109.   Respiratory: Effort normal and breath sounds normal.  GI: Soft.  Musculoskeletal: Normal range of motion. She exhibits edema.  Trace edema to bilateral lower legs.   Neurological: She is alert and oriented to person, place, and time.  Skin: Skin is warm and dry.  Psychiatric: She has a normal mood and affect. Her behavior is normal. Judgment and thought content normal.    Encounter Medications:  Patient was recently discharged from hospital and all medications have been reviewed. Outpatient Encounter Prescriptions as of 03/07/2017  Medication Sig Note  . acetaminophen (TYLENOL) 500 MG tablet Take 1,000 mg by mouth daily. Take 2 tabs ( ) by mouth every morning.  Take 2 tablets in evening if needed. 03/07/2017: As needed.   Marland Kitchen aspirin EC 81 MG tablet Take 81 mg by mouth daily.   . carvedilol (COREG) 25 MG tablet Take 1 tablet (25 mg total) by mouth 2 (two) times daily with a meal. 03/07/2017:  Per daughter, decreased to 12.5 mg twice a day   . diltiazem (CARDIZEM CD) 240 MG 24 hr capsule Take 1 capsule (240 mg total) by mouth daily.   Marland Kitchen docusate sodium (COLACE) 100 MG capsule Take 100 mg by mouth 2 (two) times daily.   . Ferrous Gluconate 324 (37.5 Fe) MG TABS Take 1 tablet by mouth 2 (two) times daily.   . fluticasone (FLONASE) 50 MCG/ACT nasal spray Place 2 sprays into both nostrils daily. 03/07/2017: Take as needed.   . hydrALAZINE (APRESOLINE) 100 MG tablet Take 100 mg by mouth 2 (two) times daily.   . isosorbide mononitrate (IMDUR) 120 MG 24 hr tablet Take 1 tablet by mouth daily.   Marland Kitchen lisinopril (PRINIVIL,ZESTRIL) 10 MG tablet Take 10 mg by mouth daily.   . Lutein 20 MG TABS Take 1 tablet by mouth daily.   . Multiple Vitamins-Minerals (PRESERVISION AREDS PO) Take by mouth daily. Pt taking one a day   . pantoprazole (PROTONIX) 40 MG tablet Take 40 mg by mouth daily.   . simvastatin (ZOCOR) 40 MG tablet Take 40 mg by mouth daily.   Marland Kitchen torsemide (DEMADEX) 20 MG tablet Take 20 mg by mouth daily.    . Fe Fum-Vit C-Vit B12-FA (TRIGELS-F FORTE) 460-60-0.01-1 MG CAPS capsule Take 1 tablet by mouth daily.   . traMADol (ULTRAM) 50 MG tablet Take 50 mg by mouth  every 6 (six) hours as needed for pain.    No facility-administered encounter medications on file as of 03/07/2017.     Functional Status:   In your present state of health, do you have any difficulty performing the following activities: 03/07/2017 03/02/2017  Hearing? Laura [name]  Vision? N Y  Difficulty concentrating or making decisions? N Y  Walking or climbing stairs? Y Y  Dressing or bathing? N N  Doing errands, shopping? Laura [name]  Preparing Food and eating ? N -  Using the Toilet? N -  In the past six months, have you accidently leaked urine? N -  Do you have problems with loss of bowel control? N -  Managing your Medications? Y -  Managing your Finances? Y -  Housekeeping or managing your Housekeeping? Y -  Some recent data might  be hidden    Fall/Depression Screening:    PHQ 2/9 Scores 03/07/2017 03/02/2017  PHQ - 2 Score 0 0    Assessment:  Pleasant 83 year old woman, lives alone in retirement apartment.  Daughter Laura [name]  Present during part of home visit, filled pt's pill planner.  Laura [name] (private paid caregiver) present  During home visit - first day, to provide care to pt  2 hours a day/5 days a week.   CHF:  Lungs clear, no complaints of sob ambulating short distances. Trace edema noted in bilateral             Lower legs.   HR today- irregular, elevated (109), no complaints of  chest pain.  Per pt- not sure             Weight obtained is accurate, difficulty seeing with macular degeneration.  Per view in Epic- 4/12                Visit with Laura Kindred NP to look into getting pt a talking scale.    Plan:  Plan to continue to follow pt for transition of care, as discussed with pt to follow up again next week            telephonically.            Plan to send Laura [name] 4/17 home visit encounter.                           THN CM Care Plan Problem One     Most Recent Value  Care Plan Problem One  Risk for readmission related to recent hospitalization for CHF   Role Documenting the Problem One  Care Management Coordinator  Care Plan for Problem One  Active  THN Long Term Goal (31-90 days)  Pt would not readmit in the next 31 days   THN Long Term Goal Start Date  02/28/17  Interventions for Problem One Long Term Goal  Emmi information (HF how to be salt smart, HF when to call your doctor or 911) provided/reviewed with pt.    THN CM Short Term Goal #1 (0-30 days)  Pt would keep all MD appointments in the next 30 days   THN CM Short Term Goal #1 Start Date  02/28/17  Interventions for Short Term Goal #1  Reviewed with pt recent visit at HF clinic.    THN CM Short Term Goal #2 (0-30 days)  Pt would not have a weight gain of >2 lbs in a day, 5 lbs in a week within the next 30 days  THN CM Short Term Goal  #2 Start Date  02/28/17  Interventions for Short Term Goal #2  Provided/reviewed with pt CHF action plan- 3 zones, paying close attention to yellow zone ( when to call MD).      Shayne Alken.   Pierzchala RN CCM [facility] Care Management  [phone]

## 2017-03-08 DIAGNOSIS — E7800 Pure hypercholesterolemia, unspecified: Secondary | ICD-10-CM | POA: Diagnosis not present

## 2017-03-08 DIAGNOSIS — I5020 Unspecified systolic (congestive) heart failure: Secondary | ICD-10-CM | POA: Diagnosis not present

## 2017-03-08 DIAGNOSIS — N184 Chronic kidney disease, stage 4 (severe): Secondary | ICD-10-CM | POA: Diagnosis not present

## 2017-03-08 DIAGNOSIS — D631 Anemia in chronic kidney disease: Secondary | ICD-10-CM | POA: Diagnosis not present

## 2017-03-08 DIAGNOSIS — I130 Hypertensive heart and chronic kidney disease with heart failure and stage 1 through stage 4 chronic kidney disease, or unspecified chronic kidney disease: Secondary | ICD-10-CM | POA: Diagnosis not present

## 2017-03-08 DIAGNOSIS — I4891 Unspecified atrial fibrillation: Secondary | ICD-10-CM | POA: Diagnosis not present

## 2017-03-10 DIAGNOSIS — N183 Chronic kidney disease, stage 3 (moderate): Secondary | ICD-10-CM | POA: Diagnosis not present

## 2017-03-10 DIAGNOSIS — N2581 Secondary hyperparathyroidism of renal origin: Secondary | ICD-10-CM | POA: Diagnosis not present

## 2017-03-10 DIAGNOSIS — I130 Hypertensive heart and chronic kidney disease with heart failure and stage 1 through stage 4 chronic kidney disease, or unspecified chronic kidney disease: Secondary | ICD-10-CM | POA: Diagnosis not present

## 2017-03-10 DIAGNOSIS — I129 Hypertensive chronic kidney disease with stage 1 through stage 4 chronic kidney disease, or unspecified chronic kidney disease: Secondary | ICD-10-CM | POA: Diagnosis not present

## 2017-03-10 DIAGNOSIS — I5020 Unspecified systolic (congestive) heart failure: Secondary | ICD-10-CM | POA: Diagnosis not present

## 2017-03-10 DIAGNOSIS — N179 Acute kidney failure, unspecified: Secondary | ICD-10-CM | POA: Diagnosis not present

## 2017-03-10 DIAGNOSIS — E7800 Pure hypercholesterolemia, unspecified: Secondary | ICD-10-CM | POA: Diagnosis not present

## 2017-03-10 DIAGNOSIS — E871 Hypo-osmolality and hyponatremia: Secondary | ICD-10-CM | POA: Diagnosis not present

## 2017-03-10 DIAGNOSIS — D631 Anemia in chronic kidney disease: Secondary | ICD-10-CM | POA: Diagnosis not present

## 2017-03-10 DIAGNOSIS — I4891 Unspecified atrial fibrillation: Secondary | ICD-10-CM | POA: Diagnosis not present

## 2017-03-10 DIAGNOSIS — N184 Chronic kidney disease, stage 4 (severe): Secondary | ICD-10-CM | POA: Diagnosis not present

## 2017-03-11 DIAGNOSIS — I130 Hypertensive heart and chronic kidney disease with heart failure and stage 1 through stage 4 chronic kidney disease, or unspecified chronic kidney disease: Secondary | ICD-10-CM | POA: Diagnosis not present

## 2017-03-11 DIAGNOSIS — I5020 Unspecified systolic (congestive) heart failure: Secondary | ICD-10-CM | POA: Diagnosis not present

## 2017-03-11 DIAGNOSIS — N184 Chronic kidney disease, stage 4 (severe): Secondary | ICD-10-CM | POA: Diagnosis not present

## 2017-03-11 DIAGNOSIS — D631 Anemia in chronic kidney disease: Secondary | ICD-10-CM | POA: Diagnosis not present

## 2017-03-11 DIAGNOSIS — E7800 Pure hypercholesterolemia, unspecified: Secondary | ICD-10-CM | POA: Diagnosis not present

## 2017-03-11 DIAGNOSIS — I4891 Unspecified atrial fibrillation: Secondary | ICD-10-CM | POA: Diagnosis not present

## 2017-03-13 ENCOUNTER — Other Ambulatory Visit: Payer: Self-pay | Admitting: *Deleted

## 2017-03-13 NOTE — Patient Outreach (Signed)
[facility]) Care Management  03/13/2017  Laura [name] 20-Aug-1924 [id number]   Phone call to patient to assess for transportation resource needs. Per patient, she currently lives at the [facility] retirement  Apartments. Patient states that they provide one meal a day and she is responsible for breakfast and dinner. Per patient her daughter is very supportive, visits often and fills her pill box.. Patient also has a private duty aid who provides 2 hours of care, 5 days per week.  Patient states that she lives next door to [facility]. If there is an emergency she has a button that she can push and a nurse will come over to assess her.  Patient states that her daughter or daughter in law assist with transportation to appointments.  Contact information for [facility] [phone] provided as a back up if daughter or daughter in law are not available. Patient verbalized having no additional community resource needs.  Patient to be closed to social work at this time.    Adriana Reams [facility] Care Management [phone]

## 2017-03-14 ENCOUNTER — Ambulatory Visit: Payer: Self-pay | Admitting: *Deleted

## 2017-03-14 ENCOUNTER — Other Ambulatory Visit: Payer: Self-pay | Admitting: *Deleted

## 2017-03-14 DIAGNOSIS — I5020 Unspecified systolic (congestive) heart failure: Secondary | ICD-10-CM | POA: Diagnosis not present

## 2017-03-14 DIAGNOSIS — I130 Hypertensive heart and chronic kidney disease with heart failure and stage 1 through stage 4 chronic kidney disease, or unspecified chronic kidney disease: Secondary | ICD-10-CM | POA: Diagnosis not present

## 2017-03-14 DIAGNOSIS — I5032 Chronic diastolic (congestive) heart failure: Secondary | ICD-10-CM | POA: Diagnosis not present

## 2017-03-14 DIAGNOSIS — I480 Paroxysmal atrial fibrillation: Secondary | ICD-10-CM | POA: Diagnosis not present

## 2017-03-14 DIAGNOSIS — N183 Chronic kidney disease, stage 3 (moderate): Secondary | ICD-10-CM | POA: Diagnosis not present

## 2017-03-14 DIAGNOSIS — D631 Anemia in chronic kidney disease: Secondary | ICD-10-CM | POA: Diagnosis not present

## 2017-03-14 DIAGNOSIS — I4891 Unspecified atrial fibrillation: Secondary | ICD-10-CM | POA: Diagnosis not present

## 2017-03-14 DIAGNOSIS — N184 Chronic kidney disease, stage 4 (severe): Secondary | ICD-10-CM | POA: Diagnosis not present

## 2017-03-14 DIAGNOSIS — E7800 Pure hypercholesterolemia, unspecified: Secondary | ICD-10-CM | POA: Diagnosis not present

## 2017-03-14 NOTE — Patient Outreach (Signed)
Transition of care call completed, ongoing follow up on recent hospitalization 4/1-4/9 for CHF.  Spoke with pt, HIPAA/Identity verified.  Pt reports weak, nurse came today, told  has CHF.   Pt reports daughter helped her weight today, result was 126 lbs.  Pt reports was sob last night, my legs were swollen a little bit, was up and down to the bathroom last night, lost some fluid, don't think has any swelling today. Pt hard of hearing, need for RN CM to repeat herself.   Pt reports appetite in not good, has 3 meals a day- oatmeal this morning, a little chicken noodle soup for lunch.   Plan:  As  discussed with pt, plan to follow up again next week telephonically (part of ongoing transition of care).  Shayne Alken.   Pierzchala RN CCM [facility] Care Management  [phone]

## 2017-03-16 DIAGNOSIS — E7800 Pure hypercholesterolemia, unspecified: Secondary | ICD-10-CM | POA: Diagnosis not present

## 2017-03-16 DIAGNOSIS — D631 Anemia in chronic kidney disease: Secondary | ICD-10-CM | POA: Diagnosis not present

## 2017-03-16 DIAGNOSIS — I5020 Unspecified systolic (congestive) heart failure: Secondary | ICD-10-CM | POA: Diagnosis not present

## 2017-03-16 DIAGNOSIS — I130 Hypertensive heart and chronic kidney disease with heart failure and stage 1 through stage 4 chronic kidney disease, or unspecified chronic kidney disease: Secondary | ICD-10-CM | POA: Diagnosis not present

## 2017-03-16 DIAGNOSIS — N184 Chronic kidney disease, stage 4 (severe): Secondary | ICD-10-CM | POA: Diagnosis not present

## 2017-03-16 DIAGNOSIS — I4891 Unspecified atrial fibrillation: Secondary | ICD-10-CM | POA: Diagnosis not present

## 2017-03-17 ENCOUNTER — Emergency Department: Payer: Medicare Other

## 2017-03-17 ENCOUNTER — Inpatient Hospital Stay
Admission: EM | Admit: 2017-03-17 | Discharge: 2017-03-21 | DRG: 291 | Disposition: A | Discharge status: Skilled Nursing Facility | Payer: Medicare Other | Attending: Internal Medicine | Admitting: Internal Medicine

## 2017-03-17 DIAGNOSIS — Z66 Do not resuscitate: Secondary | ICD-10-CM | POA: Diagnosis present

## 2017-03-17 DIAGNOSIS — Z8261 Family history of arthritis: Secondary | ICD-10-CM | POA: Diagnosis not present

## 2017-03-17 DIAGNOSIS — I482 Chronic atrial fibrillation: Secondary | ICD-10-CM | POA: Diagnosis present

## 2017-03-17 DIAGNOSIS — I129 Hypertensive chronic kidney disease with stage 1 through stage 4 chronic kidney disease, or unspecified chronic kidney disease: Secondary | ICD-10-CM | POA: Diagnosis not present

## 2017-03-17 DIAGNOSIS — Z7982 Long term (current) use of aspirin: Secondary | ICD-10-CM | POA: Diagnosis not present

## 2017-03-17 DIAGNOSIS — I5033 Acute on chronic diastolic (congestive) heart failure: Secondary | ICD-10-CM | POA: Diagnosis present

## 2017-03-17 DIAGNOSIS — Z90710 Acquired absence of both cervix and uterus: Secondary | ICD-10-CM

## 2017-03-17 DIAGNOSIS — I130 Hypertensive heart and chronic kidney disease with heart failure and stage 1 through stage 4 chronic kidney disease, or unspecified chronic kidney disease: Principal | ICD-10-CM | POA: Diagnosis present

## 2017-03-17 DIAGNOSIS — R531 Weakness: Secondary | ICD-10-CM | POA: Diagnosis not present

## 2017-03-17 DIAGNOSIS — D631 Anemia in chronic kidney disease: Secondary | ICD-10-CM | POA: Diagnosis present

## 2017-03-17 DIAGNOSIS — Z8249 Family history of ischemic heart disease and other diseases of the circulatory system: Secondary | ICD-10-CM | POA: Diagnosis not present

## 2017-03-17 DIAGNOSIS — Z885 Allergy status to narcotic agent status: Secondary | ICD-10-CM

## 2017-03-17 DIAGNOSIS — I10 Essential (primary) hypertension: Secondary | ICD-10-CM | POA: Diagnosis not present

## 2017-03-17 DIAGNOSIS — Z800 Family history of malignant neoplasm of digestive organs: Secondary | ICD-10-CM | POA: Diagnosis not present

## 2017-03-17 DIAGNOSIS — Z79899 Other long term (current) drug therapy: Secondary | ICD-10-CM

## 2017-03-17 DIAGNOSIS — M810 Age-related osteoporosis without current pathological fracture: Secondary | ICD-10-CM | POA: Diagnosis present

## 2017-03-17 DIAGNOSIS — E871 Hypo-osmolality and hyponatremia: Secondary | ICD-10-CM | POA: Diagnosis present

## 2017-03-17 DIAGNOSIS — Z79891 Long term (current) use of opiate analgesic: Secondary | ICD-10-CM

## 2017-03-17 DIAGNOSIS — H3530 Unspecified macular degeneration: Secondary | ICD-10-CM | POA: Diagnosis present

## 2017-03-17 DIAGNOSIS — Z888 Allergy status to other drugs, medicaments and biological substances status: Secondary | ICD-10-CM

## 2017-03-17 DIAGNOSIS — Z96653 Presence of artificial knee joint, bilateral: Secondary | ICD-10-CM | POA: Diagnosis present

## 2017-03-17 DIAGNOSIS — Z833 Family history of diabetes mellitus: Secondary | ICD-10-CM | POA: Diagnosis not present

## 2017-03-17 DIAGNOSIS — Z882 Allergy status to sulfonamides status: Secondary | ICD-10-CM

## 2017-03-17 DIAGNOSIS — K219 Gastro-esophageal reflux disease without esophagitis: Secondary | ICD-10-CM | POA: Diagnosis present

## 2017-03-17 DIAGNOSIS — N183 Chronic kidney disease, stage 3 (moderate): Secondary | ICD-10-CM | POA: Diagnosis not present

## 2017-03-17 DIAGNOSIS — E876 Hypokalemia: Secondary | ICD-10-CM | POA: Diagnosis present

## 2017-03-17 DIAGNOSIS — I509 Heart failure, unspecified: Secondary | ICD-10-CM | POA: Diagnosis not present

## 2017-03-17 DIAGNOSIS — E785 Hyperlipidemia, unspecified: Secondary | ICD-10-CM | POA: Diagnosis present

## 2017-03-17 DIAGNOSIS — Z881 Allergy status to other antibiotic agents status: Secondary | ICD-10-CM | POA: Diagnosis not present

## 2017-03-17 DIAGNOSIS — Z880 Allergy status to penicillin: Secondary | ICD-10-CM

## 2017-03-17 DIAGNOSIS — G3184 Mild cognitive impairment, so stated: Secondary | ICD-10-CM | POA: Diagnosis present

## 2017-03-17 DIAGNOSIS — E1122 Type 2 diabetes mellitus with diabetic chronic kidney disease: Secondary | ICD-10-CM | POA: Diagnosis not present

## 2017-03-17 DIAGNOSIS — N184 Chronic kidney disease, stage 4 (severe): Secondary | ICD-10-CM | POA: Diagnosis present

## 2017-03-17 DIAGNOSIS — E213 Hyperparathyroidism, unspecified: Secondary | ICD-10-CM | POA: Diagnosis present

## 2017-03-17 DIAGNOSIS — I5032 Chronic diastolic (congestive) heart failure: Secondary | ICD-10-CM | POA: Diagnosis not present

## 2017-03-17 DIAGNOSIS — E1022 Type 1 diabetes mellitus with diabetic chronic kidney disease: Secondary | ICD-10-CM | POA: Diagnosis not present

## 2017-03-17 DIAGNOSIS — E7800 Pure hypercholesterolemia, unspecified: Secondary | ICD-10-CM | POA: Diagnosis not present

## 2017-03-17 DIAGNOSIS — I4891 Unspecified atrial fibrillation: Secondary | ICD-10-CM | POA: Diagnosis not present

## 2017-03-17 DIAGNOSIS — I5020 Unspecified systolic (congestive) heart failure: Secondary | ICD-10-CM | POA: Diagnosis not present

## 2017-03-17 DIAGNOSIS — R402412 Glasgow coma scale score 13-15, at arrival to emergency department: Secondary | ICD-10-CM | POA: Diagnosis present

## 2017-03-17 DIAGNOSIS — Z7401 Bed confinement status: Secondary | ICD-10-CM | POA: Diagnosis not present

## 2017-03-17 DIAGNOSIS — R4182 Altered mental status, unspecified: Secondary | ICD-10-CM | POA: Diagnosis not present

## 2017-03-17 LAB — COMPREHENSIVE METABOLIC PANEL
ALBUMIN: 3.4 g/dL — AB (ref 3.5–5.0)
ALT: 11 U/L — ABNORMAL LOW (ref 14–54)
ANION GAP: 10 (ref 5–15)
AST: 21 U/L (ref 15–41)
Alkaline Phosphatase: 76 U/L (ref 38–126)
BILIRUBIN TOTAL: 0.5 mg/dL (ref 0.3–1.2)
BUN: 30 mg/dL — ABNORMAL HIGH (ref 6–20)
CHLORIDE: 91 mmol/L — AB (ref 101–111)
CO2: 23 mmol/L (ref 22–32)
Calcium: 8.7 mg/dL — ABNORMAL LOW (ref 8.9–10.3)
Creatinine, Ser: 1.69 mg/dL — ABNORMAL HIGH (ref 0.44–1.00)
GFR calc Af Amer: 30 mL/min — ABNORMAL LOW (ref >60)
GFR calc non Af Amer: 26 mL/min — ABNORMAL LOW (ref >60)
GLUCOSE: 108 mg/dL — AB (ref 65–99)
POTASSIUM: 4.1 mmol/L (ref 3.5–5.1)
SODIUM: 124 mmol/L — AB (ref 135–145)
TOTAL PROTEIN: 5.9 g/dL — AB (ref 6.5–8.1)

## 2017-03-17 LAB — URINALYSIS, COMPLETE (UACMP) WITH MICROSCOPIC
BILIRUBIN URINE: NEGATIVE
Glucose, UA: NEGATIVE mg/dL
HGB URINE DIPSTICK: NEGATIVE
Ketones, ur: NEGATIVE mg/dL
Leukocytes, UA: NEGATIVE
NITRITE: NEGATIVE
PROTEIN: 100 mg/dL — AB
Specific Gravity, Urine: 1.013 (ref 1.005–1.030)
pH: 5 (ref 5.0–8.0)

## 2017-03-17 LAB — CBC
HEMATOCRIT: 25.4 % — AB (ref 35.0–47.0)
HEMOGLOBIN: 8.8 g/dL — AB (ref 12.0–16.0)
MCH: 31.1 pg (ref 26.0–34.0)
MCHC: 34.6 g/dL (ref 32.0–36.0)
MCV: 89.9 fL (ref 80.0–100.0)
Platelets: 191 10*3/uL (ref 150–440)
RBC: 2.83 MIL/uL — ABNORMAL LOW (ref 3.80–5.20)
RDW: 13.2 % (ref 11.5–14.5)
WBC: 3.8 10*3/uL (ref 3.6–11.0)

## 2017-03-17 LAB — SODIUM: SODIUM: 125 mmol/L — AB (ref 135–145)

## 2017-03-17 MED ORDER — LISINOPRIL 10 MG PO TABS
10.0000 mg | ORAL_TABLET | Freq: Every day | ORAL | Status: DC
  Administered 2017-03-18 – 2017-03-21 (×4): 10 mg via ORAL
  Filled 2017-03-17 (×4): qty 1

## 2017-03-17 MED ORDER — DOCUSATE SODIUM 100 MG PO CAPS
100.0000 mg | ORAL_CAPSULE | Freq: Two times a day (BID) | ORAL | Status: DC
  Administered 2017-03-17 – 2017-03-21 (×8): 100 mg via ORAL
  Filled 2017-03-17 (×8): qty 1

## 2017-03-17 MED ORDER — ACETAMINOPHEN 325 MG PO TABS: 650.0000 mg | ORAL_TABLET | Freq: Four times a day (QID) | ORAL | Status: DC | PRN

## 2017-03-17 MED ORDER — FERROUS GLUCONATE 324 (38 FE) MG PO TABS
324.0000 mg | ORAL_TABLET | Freq: Two times a day (BID) | ORAL | Status: DC
  Administered 2017-03-18 – 2017-03-21 (×8): 324 mg via ORAL
  Filled 2017-03-17 (×9): qty 1

## 2017-03-17 MED ORDER — SODIUM CHLORIDE 0.9% FLUSH
3.0000 mL | Freq: Two times a day (BID) | INTRAVENOUS | Status: DC
  Administered 2017-03-18 – 2017-03-21 (×7): 3 mL via INTRAVENOUS

## 2017-03-17 MED ORDER — OCUVITE-LUTEIN PO CAPS
ORAL_CAPSULE | Freq: Every day | ORAL | Status: DC
  Administered 2017-03-20 – 2017-03-21 (×2): 1 via ORAL
  Filled 2017-03-17 (×4): qty 1

## 2017-03-17 MED ORDER — VITAMIN C 500 MG PO TABS: 250.0000 mg | ORAL_TABLET | Freq: Every day | ORAL | Status: DC

## 2017-03-17 MED ORDER — VITAMIN B-12 100 MCG PO TABS: 100.0000 ug | ORAL_TABLET | Freq: Every day | ORAL | Status: DC

## 2017-03-17 MED ORDER — FE FUM-VIT C-VIT B12-FA 460-60-0.01-1 MG PO CAPS: 1.0000 | ORAL_CAPSULE | Freq: Every day | ORAL | Status: DC

## 2017-03-17 MED ORDER — ONDANSETRON HCL 4 MG PO TABS: 4.0000 mg | ORAL_TABLET | Freq: Four times a day (QID) | ORAL | Status: DC | PRN

## 2017-03-17 MED ORDER — PANTOPRAZOLE SODIUM 40 MG PO TBEC
40.0000 mg | DELAYED_RELEASE_TABLET | Freq: Every day | ORAL | Status: DC
  Administered 2017-03-18 – 2017-03-21 (×4): 40 mg via ORAL
  Filled 2017-03-17 (×4): qty 1

## 2017-03-17 MED ORDER — TRAMADOL HCL 50 MG PO TABS: 50.0000 mg | ORAL_TABLET | Freq: Two times a day (BID) | ORAL | Status: DC | PRN

## 2017-03-17 MED ORDER — FOLIC ACID 1 MG PO TABS
0.5000 mg | ORAL_TABLET | Freq: Every day | ORAL | Status: DC
  Filled 2017-03-17 (×4): qty 1

## 2017-03-17 MED ORDER — ENOXAPARIN SODIUM 30 MG/0.3ML ~~LOC~~ SOLN: 30.0000 mg | SUBCUTANEOUS | Status: DC

## 2017-03-17 MED ORDER — ONDANSETRON HCL 4 MG/2ML IJ SOLN: 4.0000 mg | Freq: Four times a day (QID) | INTRAMUSCULAR | Status: DC | PRN

## 2017-03-17 MED ORDER — ACETAMINOPHEN 500 MG PO TABS
1000.0000 mg | ORAL_TABLET | Freq: Every day | ORAL | Status: DC
  Administered 2017-03-18 – 2017-03-21 (×4): 1000 mg via ORAL
  Filled 2017-03-17 (×4): qty 2

## 2017-03-17 MED ORDER — SIMVASTATIN 20 MG PO TABS
40.0000 mg | ORAL_TABLET | Freq: Every day | ORAL | Status: DC
  Administered 2017-03-18 – 2017-03-20 (×3): 40 mg via ORAL
  Filled 2017-03-17 (×3): qty 2

## 2017-03-17 MED ORDER — LUTEIN 20 MG PO TABS: 1.0000 | ORAL_TABLET | Freq: Every day | ORAL | Status: DC

## 2017-03-17 MED ORDER — SODIUM CHLORIDE 0.9 % IV BOLUS (SEPSIS): 1000.0000 mL | Freq: Once | INTRAVENOUS | Status: DC

## 2017-03-17 MED ORDER — ISOSORBIDE MONONITRATE ER 30 MG PO TB24
120.0000 mg | ORAL_TABLET | Freq: Every day | ORAL | Status: DC
  Administered 2017-03-18 – 2017-03-21 (×4): 120 mg via ORAL
  Filled 2017-03-17 (×4): qty 4

## 2017-03-17 MED ORDER — DILTIAZEM HCL ER COATED BEADS 240 MG PO CP24
240.0000 mg | ORAL_CAPSULE | Freq: Every day | ORAL | Status: DC
  Administered 2017-03-18 – 2017-03-21 (×4): 240 mg via ORAL
  Filled 2017-03-17 (×4): qty 1

## 2017-03-17 MED ORDER — ACETAMINOPHEN 650 MG RE SUPP: 650.0000 mg | Freq: Four times a day (QID) | RECTAL | Status: DC | PRN

## 2017-03-17 MED ORDER — HYDRALAZINE HCL 50 MG PO TABS
100.0000 mg | ORAL_TABLET | Freq: Two times a day (BID) | ORAL | Status: DC
  Administered 2017-03-17 – 2017-03-21 (×8): 100 mg via ORAL
  Filled 2017-03-17 (×8): qty 2

## 2017-03-17 MED ORDER — ASPIRIN EC 81 MG PO TBEC
81.0000 mg | DELAYED_RELEASE_TABLET | Freq: Every day | ORAL | Status: DC
  Administered 2017-03-18 – 2017-03-21 (×4): 81 mg via ORAL
  Filled 2017-03-17 (×4): qty 1

## 2017-03-17 MED ORDER — FLUTICASONE PROPIONATE 50 MCG/ACT NA SUSP
2.0000 | Freq: Every day | NASAL | Status: DC
Start: 2017-03-18 — End: 2017-03-21
  Administered 2017-03-18 – 2017-03-21 (×4): 2 via NASAL
  Filled 2017-03-17: qty 16

## 2017-03-17 MED ORDER — SODIUM CHLORIDE 0.9 % IV SOLN
INTRAVENOUS | Status: DC
  Administered 2017-03-18: 01:00:00 via INTRAVENOUS

## 2017-03-17 MED ORDER — DOCUSATE SODIUM 100 MG PO CAPS: 100.0000 mg | ORAL_CAPSULE | Freq: Two times a day (BID) | ORAL | Status: DC | PRN

## 2017-03-17 MED ORDER — CARVEDILOL 25 MG PO TABS
25.0000 mg | ORAL_TABLET | Freq: Two times a day (BID) | ORAL | Status: DC
  Administered 2017-03-18 – 2017-03-21 (×7): 25 mg via ORAL
  Filled 2017-03-17 (×8): qty 1

## 2017-03-17 MED ORDER — SODIUM CHLORIDE 0.9 % IV BOLUS (SEPSIS)
1000.0000 mL | Freq: Once | INTRAVENOUS | Status: AC
  Administered 2017-03-17: 1000 mL via INTRAVENOUS

## 2017-03-17 NOTE — ED Notes (Signed)
Pt's daughter updated on admission process.  

## 2017-03-17 NOTE — ED Triage Notes (Addendum)
Pt had blood work done at doctors office today and was told low sodium levels. Sodium 124. Sent here for further eval. Pt c/o back and hip pain that are chronic. Alert, oriented. States "sorta weak." Daughter states weakness, confusion, and dizziness last few days.

## 2017-03-17 NOTE — ED Notes (Signed)
Pt denies increased shob. Pt states "I don't feel any worse, but I don't feel any better". Pt relates she is shob frequently and has dyspnea with exertion chronically. No jvd noted. Fluids infusing at 28ml/hr. Pt denies need to urinate at this time.

## 2017-03-17 NOTE — ED Notes (Signed)
First Nurse Note   Sent in bt PCP with low sodium

## 2017-03-17 NOTE — ED Notes (Signed)
Pt transport to 143 

## 2017-03-17 NOTE — H&P (Signed)
[facility] [place]   PATIENT NAME: Laura [name]    MR#:  [id number]  DATE OF BIRTH:  [birth year]  DATE OF ADMISSION:  03/17/2017  PRIMARY CARE PHYSICIAN: Marisue Ivan, MD   REQUESTING/REFERRING PHYSICIAN: Minna Antis, MD  CHIEF COMPLAINT:  Abnormal lab  HISTORY OF PRESENT ILLNESS:  Laura [name]  is a 83 y.o. female with a known history of Chronic hyponatremia baseline at 130, hyperlipidemia, hypertension, congestive heart failure, chronic kidney disease is brought into the emergency department by her daughter for abnormal lab. According to the daughter patient was seen by her primary care physician for weight gain and increased shortness of breath and patient's torsemide dose was increased . Patient was found to be more confused and labs were done sodium is at 124. Patient seemed to be more confused than her normal. Patient lives alone. Hospitalist team is called to admit the patient for hyponatremia   PAST MEDICAL HISTORY:   Past Medical History:  Diagnosis Date  . Anemia   . Arrhythmia    atrial fibrillation  . CHF (congestive heart failure) (HCC)   . Chronic kidney disease   . GERD (gastroesophageal reflux disease)   . GI bleed   . High cholesterol   . Hypertension   . Hyponatremia   . Macular degeneration   . SVT (supraventricular tachycardia) (HCC)     PAST SURGICAL HISTOIRY:   Past Surgical History:  Procedure Laterality Date  . ABDOMINAL HYSTERECTOMY    . JOINT REPLACEMENT    . REPLACEMENT TOTAL KNEE BILATERAL    . VAGINAL HYSTERECTOMY     Partial    SOCIAL HISTORY:   Social History  Substance Use Topics  . Smoking status: Never Smoker  . Smokeless tobacco: Never Used  . Alcohol use No    FAMILY HISTORY:   Family History  Problem Relation Age of Onset  . Hypertension Mother   . CAD Father   . Heart disease Father   . Arthritis Father   . Pancreatic cancer Son   . Heart failure Sister   .  Diabetes Brother   . Heart failure Sister   . Diabetes Son     DRUG ALLERGIES:   Allergies  Allergen Reactions  . Penicillins Shortness Of Breath        . Codeine Itching  . Amlodipine Rash    Peripheral edema  . Ciprofloxacin Rash  . Sulfa Antibiotics Rash    REVIEW OF SYSTEMS:  CONSTITUTIONAL: No fever, fatigue or weakness.  EYES: No blurred or double vision.  EARS, NOSE, AND THROAT: No tinnitus or ear pain.  RESPIRATORY: No cough, shortness of breath, wheezing or hemoptysis.  CARDIOVASCULAR: No chest pain, orthopnea, edema.  GASTROINTESTINAL: No nausea, vomiting, diarrhea or abdominal pain.  GENITOURINARY: No dysuria, hematuria.  ENDOCRINE: No polyuria, nocturia,  HEMATOLOGY: No anemia, easy bruising or bleeding SKIN: No rash or lesion. MUSCULOSKELETAL: No joint pain or arthritis.   NEUROLOGIC: No tingling, numbness, weakness.  PSYCHIATRY: No anxiety or depression.   MEDICATIONS AT HOME:   Prior to Admission medications   Medication Sig Start Date End Date Taking? Authorizing Provider  aspirin EC 81 MG tablet Take 81 mg by mouth daily.   Yes Historical Provider, MD  carvedilol (COREG) 25 MG tablet Take 1 tablet (25 mg total) by mouth 2 (two) times daily with a meal. 02/27/17  Yes Sital Mody, MD  diltiazem (CARDIZEM CD) 240 MG 24 hr capsule Take 1 capsule (240 mg  total) by mouth daily. 02/28/17  Yes Adrian Saran, MD  docusate sodium (COLACE) 100 MG capsule Take 100 mg by mouth 2 (two) times daily. 01/02/17  Yes Historical Provider, MD  Fe Fum-Vit C-Vit B12-FA (TRIGELS-F FORTE) 460-60-0.01-1 MG CAPS capsule Take 1 tablet by mouth daily.   Yes Historical Provider, MD  Ferrous Gluconate 324 (37.5 Fe) MG TABS Take 1 tablet by mouth 2 (two) times daily. 11/15/16  Yes Historical Provider, MD  hydrALAZINE (APRESOLINE) 100 MG tablet Take 100 mg by mouth 2 (two) times daily. 12/23/16  Yes Historical Provider, MD  isosorbide mononitrate (IMDUR) 120 MG 24 hr tablet Take 1 tablet by mouth  daily. 12/23/16  Yes Historical Provider, MD  lisinopril (PRINIVIL,ZESTRIL) 10 MG tablet Take 10 mg by mouth daily. 11/15/16  Yes Historical Provider, MD  Lutein 20 MG TABS Take 1 tablet by mouth daily.   Yes Historical Provider, MD  Multiple Vitamins-Minerals (PRESERVISION AREDS PO) Take by mouth daily. Pt taking one a day   Yes Historical Provider, MD  pantoprazole (PROTONIX) 40 MG tablet Take 40 mg by mouth daily. 12/23/16  Yes Historical Provider, MD  simvastatin (ZOCOR) 40 MG tablet Take 40 mg by mouth daily. 12/23/16  Yes Historical Provider, MD  torsemide (DEMADEX) 20 MG tablet Take 20 mg by mouth daily.    Yes Historical Provider, MD  acetaminophen (TYLENOL) 500 MG tablet Take 1,000 mg by mouth daily. Take 2 tabs ( ) by mouth every morning.  Take 2 tablets in evening if needed.    Historical Provider, MD  fluticasone (FLONASE) 50 MCG/ACT nasal spray Place 2 sprays into both nostrils daily.    Historical Provider, MD  traMADol (ULTRAM) 50 MG tablet Take 50 mg by mouth every 6 (six) hours as needed for pain.    Historical Provider, MD      VITAL SIGNS:  Blood pressure (!) 168/81, pulse 69, temperature 98 F (36.7 C), resp. rate (!) 21, height  (1.448 m), weight 56.7 kg (125 lb), SpO2 96 %.  PHYSICAL EXAMINATION:  GENERAL:  83 y.o.-year-old patient lying in the bed with no acute distress.  EYES: Pupils equal, round, reactive to light and accommodation. No scleral icterus. Extraocular muscles intact.  HEENT: Head atraumatic, normocephalic. Oropharynx and nasopharynx clear.  NECK:  Supple, no jugular venous distention. No thyroid enlargement, no tenderness.  LUNGS: Normal breath sounds bilaterally, no wheezing, rales,rhonchi or crepitation. No use of accessory muscles of respiration.  CARDIOVASCULAR: S1, S2 normal. No murmurs, rubs, or gallops.  ABDOMEN: Soft, nontender, nondistended. Bowel sounds present. No organomegaly or mass.  EXTREMITIES: No pedal edema, cyanosis, or clubbing.   NEUROLOGIC: Cranial nerves are grossly intact. Muscle strength at her baseline. Sensation intact. Gait not checked.  PSYCHIATRIC: The patient is alert and oriented x 2  SKIN: No obvious rash, lesion, or ulcer.   LABORATORY PANEL:   CBC  Recent Labs Lab 03/17/17 1625  WBC 3.8  HGB 8.8*  HCT 25.4*  PLT 191   ------------------------------------------------------------------------------------------------------------------  Chemistries   Recent Labs Lab 03/17/17 1625  NA 124*  K 4.1  CL 91*  CO2 23  GLUCOSE 108*  BUN 30*  CREATININE 1.69*  CALCIUM 8.7*  AST 21  ALT 11*  ALKPHOS 76  BILITOT 0.5   ------------------------------------------------------------------------------------------------------------------  Cardiac Enzymes No results for input(s): TROPONINI in the last 168 hours. ------------------------------------------------------------------------------------------------------------------  RADIOLOGY:  Dg Chest 2 View  Result Date: 03/17/2017 CLINICAL DATA:  Congestive heart failure.  Low sodium. EXAM: CHEST  2  VIEW COMPARISON:  02/22/2017 FINDINGS: Chronic cardiomegaly. Chronic aortic atherosclerosis with calcification and tortuosity. Small bilateral pleural effusions. Mild basilar atelectasis. Pulmonary venous hypertension without frank edema. IMPRESSION: Cardiomegaly.  Small effusions.  Basilar atelectasis. Electronically Signed   By: Paulina Fusi M.D.   On: 03/17/2017 18:34    EKG:   Orders placed or performed during the hospital encounter of 02/19/17  . EKG 12-Lead  . EKG 12-Lead  . ED EKG  . ED EKG  . EKG    IMPRESSION AND PLAN:   Laura [name]  is a 83 y.o. female with a known history of Chronic hyponatremia baseline at 130, hyperlipidemia, hypertension, congestive heart failure, chronic kidney disease is brought into the emergency department by her daughter for abnormal lab. According to the daughter patient was seen by her primary care physician  for weight gain and increased shortness of breath and patient's torsemide dose was increased . Patient was found to be more confused and labs were done sodium is at 124.  #acute on chronic hyponatremia   admit to MedSurg unit and monitor patient on telemetry. Normal saline bolus was given in the ED. Continue gentle hydration with IV fluids and monitor serial sodiums. Check serum and urine osmolarity. Check fasting lipid panel. Urine electrolytes    #chronic history of congestive heart failure not fluid overloaded at this time   torsemide dose was recently increased which could have caused overdiuresis. Hold off torsemide tonight.   daily weights, intake and output   #Chronic kidney disease monitor renal function closely. Avoid nephrotoxins  #Essential hypertension continue home medications Imdur, Toprol, Coreg, Cardizem holding torsemide  #Chronic atrial fibrillation no RVR at this time. Continue home medication aspirin, and Cardizem  GI prophylaxis with Protonix DVT prophylaxis with Lovenox subcutaneous   All the records are reviewed and case discussed with ED provider. Management plans discussed with the patient, family and they are in agreement.  CODE STATUS: dnr   TOTAL TIME TAKING CARE OF THIS PATIENT: 43 minutes.   Note: This dictation was prepared with Dragon dictation along with smaller phrase technology. Any transcriptional errors that result from this process are unintentional.  Ramonita Lab M.D on 03/17/2017 at 9:25 PM  Between 7am to 6pm - Pager - [phone]  After 6pm go to [email or web address] - password [email or web address] [facility]  [facility]  Office  [phone]  CC: Primary care physician; Marisue Ivan, MD

## 2017-03-17 NOTE — ED Notes (Signed)
Pt's visitor complains that pt is shob and has difficulty lying flat. Pt states she feels shob with exertion. Fluids slowed to 274ml/hr and md notified. Pt with diminished bs, skin normal color warm and dry. Head of bed raised, pt is able to speak in full sentences.

## 2017-03-17 NOTE — ED Notes (Signed)
Report from kristin, rn. 

## 2017-03-17 NOTE — ED Provider Notes (Signed)
[facility] Emergency Department Provider Note  Time seen: 6:12 PM  I have reviewed the triage vital signs and the nursing notes.   HISTORY  Chief Complaint Abnormal Lab    HPI Laura [name] is a 83 y.o. female with a past medical history of hyponatremia, hypertension, hyperlipidemia, CHF, CKD, who presents to the emergency department for low sodium. According to the patient and the daughter patient was seen by her primary care doctor for weight gain and increased shortness of breath. This was thought to be due to fluid retention. The patient was started on an increased dose of torsemide Tuesday and Wednesday. Patient was called back today because her lab work performed on Tuesday showed a low sodium of 124. Patient has a history of chronic hyponatremia, but per report the patient has been more confused over the past 2 days which prompted the emergency department evaluation.  Here the patient's daughter states slight confusion although she states there is some mild confusion at baseline. She states the patient is on a salt restricted diet for her blood pressure she is also on fluid restriction for her low-salt levels. Patient's only complaint is of shortness of breath which she states has been ongoing for the past several weeks. Currently 98% room air saturation with a normal respiratory rate. Patient is calm, cooperative, pleasant and in no distress.  Past Medical History:  Diagnosis Date  . Anemia   . Arrhythmia    atrial fibrillation  . CHF (congestive heart failure) (HCC)   . Chronic kidney disease   . GERD (gastroesophageal reflux disease)   . GI bleed   . High cholesterol   . Hypertension   . Hyponatremia   . Macular degeneration   . SVT (supraventricular tachycardia) [facility])     Patient Active Problem List   Diagnosis Date Noted  . Atrial fibrillation (HCC) 03/04/2017  . HTN (hypertension) 03/04/2017  . Hyponatremia 03/04/2017  . Acute CHF (HCC)  02/19/2017  . CHF (congestive heart failure) (HCC) 02/19/2017    Past Surgical History:  Procedure Laterality Date  . ABDOMINAL HYSTERECTOMY    . JOINT REPLACEMENT    . REPLACEMENT TOTAL KNEE BILATERAL    . VAGINAL HYSTERECTOMY     Partial    Prior to Admission medications   Medication Sig Start Date End Date Taking? Authorizing Provider  acetaminophen (TYLENOL) 500 MG tablet Take 1,000 mg by mouth daily. Take 2 tabs ( ) by mouth every morning.  Take 2 tablets in evening if needed.    Historical Provider, MD  aspirin EC 81 MG tablet Take 81 mg by mouth daily.    Historical Provider, MD  carvedilol (COREG) 25 MG tablet Take 1 tablet (25 mg total) by mouth 2 (two) times daily with a meal. 02/27/17   Adrian Saran, MD  diltiazem (CARDIZEM CD) 240 MG 24 hr capsule Take 1 capsule (240 mg total) by mouth daily. 02/28/17   Adrian Saran, MD  docusate sodium (COLACE) 100 MG capsule Take 100 mg by mouth 2 (two) times daily. 01/02/17   Historical Provider, MD  Fe Fum-Vit C-Vit B12-FA (TRIGELS-F FORTE) 460-60-0.01-1 MG CAPS capsule Take 1 tablet by mouth daily.    Historical Provider, MD  Ferrous Gluconate 324 (37.5 Fe) MG TABS Take 1 tablet by mouth 2 (two) times daily. 11/15/16   Historical Provider, MD  fluticasone (FLONASE) 50 MCG/ACT nasal spray Place 2 sprays into both nostrils daily.    Historical Provider, MD  hydrALAZINE (APRESOLINE) 100 MG  tablet Take 100 mg by mouth 2 (two) times daily. 12/23/16   Historical Provider, MD  isosorbide mononitrate (IMDUR) 120 MG 24 hr tablet Take 1 tablet by mouth daily. 12/23/16   Historical Provider, MD  lisinopril (PRINIVIL,ZESTRIL) 10 MG tablet Take 10 mg by mouth daily. 11/15/16   Historical Provider, MD  Lutein 20 MG TABS Take 1 tablet by mouth daily.    Historical Provider, MD  Multiple Vitamins-Minerals (PRESERVISION AREDS PO) Take by mouth daily. Pt taking one a day    Historical Provider, MD  pantoprazole (PROTONIX) 40 MG tablet Take 40 mg by mouth daily.  12/23/16   Historical Provider, MD  simvastatin (ZOCOR) 40 MG tablet Take 40 mg by mouth daily. 12/23/16   Historical Provider, MD  torsemide (DEMADEX) 20 MG tablet Take 20 mg by mouth daily.     Historical Provider, MD  traMADol (ULTRAM) 50 MG tablet Take 50 mg by mouth every 6 (six) hours as needed for pain.    Historical Provider, MD    Allergies  Allergen Reactions  . Penicillins Shortness Of Breath        . Codeine Itching  . Amlodipine Rash    Peripheral edema  . Ciprofloxacin Rash  . Sulfa Antibiotics Rash    Family History  Problem Relation Age of Onset  . Hypertension Mother   . CAD Father   . Heart disease Father   . Arthritis Father   . Pancreatic cancer Son   . Heart failure Sister   . Diabetes Brother   . Heart failure Sister   . Diabetes Son     Social History Social History  Substance Use Topics  . Smoking status: Never Smoker  . Smokeless tobacco: Never Used  . Alcohol use No    Review of Systems Constitutional: Negative for fever. Eyes: Negative for visual changes. ENT: Negative for congestion Cardiovascular: Negative for chest pain. Respiratory: Positive for mild shortness of breath. Gastrointestinal: Negative for abdominal pain Genitourinary: Negative for dysuria. Musculoskeletal: Negative for back pain. Skin: Negative for rash. Neurological: Negative for headache All other ROS negative  ____________________________________________   PHYSICAL EXAM:  VITAL SIGNS: ED Triage Vitals  Enc Vitals Group     BP 03/17/17 1613 117/69     Pulse Rate 03/17/17 1613 (!) 125     Resp 03/17/17 1613 18     Temp 03/17/17 1613 98 F (36.7 C)     Temp src --      SpO2 03/17/17 1613 98 %     Weight 03/17/17 1614 125 lb (56.7 kg)     Height 03/17/17 1614  (1.448 m)     Head Circumference --      Peak Flow --      Pain Score 03/17/17 1613 7     Pain Loc --      Pain Edu? --      Excl. in GC? --     Constitutional: Alert and oriented. Well  appearing and in no distress. Eyes: Normal exam ENT   Head: Normocephalic and atraumatic.   Mouth/Throat: Mucous membranes are moist. Cardiovascular: Normal rate, regular rhythm. No murmur Respiratory: Normal respiratory effort without tachypnea nor retractions. Breath sounds are clear  Gastrointestinal: Soft and nontender. No distention.   Musculoskeletal: Nontender with normal range of motion in all extremities. Neurologic:  Normal speech and language. No gross focal neurologic deficits  Skin:  Skin is warm, dry and intact.  Psychiatric: Mood and affect are normal.  ____________________________________________     RADIOLOGY  Cardiomegaly.  ____________________________________________   INITIAL IMPRESSION / ASSESSMENT AND PLAN / ED COURSE  Pertinent labs & imaging results that were available during my care of the patient were reviewed by me and considered in my medical decision making (see chart for details).  Patient presents to the emergency department for hyponatremia. Patient has a history of chronic hyponatremia sodium of 128 upon discharge during her last hospitalization. Per report the patient had increased weakness and confusion. Daughter states there is a slight increasing confusion although states there is some mild confusion at baseline. Patient is on a salt restricted diet due to her hypertension. I discussed with the patient and daughter to encourage salt in the diet as I believe the sodium level is more important than the mild blood pressure response that would provide. Patient is anemic although this is also at baseline. Patient's kidney function appears improved. We will IV hydrate the patient with a liter of normal saline. We'll obtain a chest x-ray as well as a urinalysis. Once results are known we'll discuss disposition. I offered admission to the hospital, but they would rather wait and see what the workup shows.  Daughter states given the patient's  increased confusion over the past few days she also states increased weakness she does not feel comfortable with [place] discharging the patient home as she lives alone. Given the patient's hyponatremia with weakness and confusion I believe it is reasonable to admit the patient to the hospital to see if her symptoms improve.  ____________________________________________   FINAL CLINICAL IMPRESSION(S) / ED DIAGNOSES  Hyponatremia Weakness   Minna Antis, MD 03/17/17 2015

## 2017-03-17 NOTE — ED Notes (Signed)
Pt assisted up to bathroom.  

## 2017-03-18 LAB — CBC
HCT: 22.3 % — ABNORMAL LOW (ref 35.0–47.0)
Hemoglobin: 7.8 g/dL — ABNORMAL LOW (ref 12.0–16.0)
MCH: 31.5 pg (ref 26.0–34.0)
MCHC: 34.9 g/dL (ref 32.0–36.0)
MCV: 90.3 fL (ref 80.0–100.0)
PLATELETS: 175 10*3/uL (ref 150–440)
RBC: 2.47 MIL/uL — AB (ref 3.80–5.20)
RDW: 13.4 % (ref 11.5–14.5)
WBC: 3.4 10*3/uL — ABNORMAL LOW (ref 3.6–11.0)

## 2017-03-18 LAB — COMPREHENSIVE METABOLIC PANEL
ALK PHOS: 54 U/L (ref 38–126)
ALT: 11 U/L — AB (ref 14–54)
AST: 16 U/L (ref 15–41)
Albumin: 2.9 g/dL — ABNORMAL LOW (ref 3.5–5.0)
Anion gap: 7 (ref 5–15)
BILIRUBIN TOTAL: 0.5 mg/dL (ref 0.3–1.2)
BUN: 25 mg/dL — ABNORMAL HIGH (ref 6–20)
CALCIUM: 8.3 mg/dL — AB (ref 8.9–10.3)
CO2: 23 mmol/L (ref 22–32)
CREATININE: 1.58 mg/dL — AB (ref 0.44–1.00)
Chloride: 95 mmol/L — ABNORMAL LOW (ref 101–111)
GFR calc non Af Amer: 28 mL/min — ABNORMAL LOW (ref >60)
GFR, EST AFRICAN AMERICAN: 32 mL/min — AB (ref >60)
GLUCOSE: 76 mg/dL (ref 65–99)
Potassium: 3.7 mmol/L (ref 3.5–5.1)
SODIUM: 125 mmol/L — AB (ref 135–145)
Total Protein: 5 g/dL — ABNORMAL LOW (ref 6.5–8.1)

## 2017-03-18 LAB — SODIUM
SODIUM: 124 mmol/L — AB (ref 135–145)
Sodium: 126 mmol/L — ABNORMAL LOW (ref 135–145)

## 2017-03-18 LAB — LIPID PANEL
CHOLESTEROL: 103 mg/dL (ref 0–200)
HDL: 53 mg/dL (ref >40)
LDL Cholesterol: 38 mg/dL (ref 0–99)
Total CHOL/HDL Ratio: 1.9 RATIO
Triglycerides: 62 mg/dL (ref <150)
VLDL: 12 mg/dL (ref 0–40)

## 2017-03-18 LAB — SODIUM, URINE, RANDOM: SODIUM UR: 27 mmol/L

## 2017-03-18 LAB — OSMOLALITY, URINE: OSMOLALITY UR: 306 mosm/kg (ref 300–900)

## 2017-03-18 MED ORDER — HEPARIN SODIUM (PORCINE) 5000 UNIT/ML IJ SOLN
5000.0000 [IU] | Freq: Two times a day (BID) | INTRAMUSCULAR | Status: DC
  Administered 2017-03-18 – 2017-03-21 (×8): 5000 [IU] via SUBCUTANEOUS
  Filled 2017-03-18 (×8): qty 1

## 2017-03-18 MED ORDER — FOLIC ACID 1 MG PO TABS
0.5000 mg | ORAL_TABLET | Freq: Every day | ORAL | Status: DC
  Administered 2017-03-18 – 2017-03-21 (×4): 0.5 mg via ORAL

## 2017-03-18 MED ORDER — VITAMIN B-12 100 MCG PO TABS
100.0000 ug | ORAL_TABLET | Freq: Every day | ORAL | Status: DC
  Administered 2017-03-18 – 2017-03-21 (×4): 100 ug via ORAL
  Filled 2017-03-18 (×4): qty 1

## 2017-03-18 MED ORDER — OCUVITE-LUTEIN PO CAPS
1.0000 | ORAL_CAPSULE | Freq: Every day | ORAL | Status: DC
  Administered 2017-03-18 – 2017-03-19 (×2): 1 via ORAL

## 2017-03-18 MED ORDER — FUROSEMIDE 10 MG/ML IJ SOLN
60.0000 mg | Freq: Three times a day (TID) | INTRAMUSCULAR | Status: DC
  Administered 2017-03-18 (×3): 60 mg via INTRAVENOUS
  Filled 2017-03-18 (×3): qty 6

## 2017-03-18 MED ORDER — VITAMIN C 500 MG PO TABS
250.0000 mg | ORAL_TABLET | Freq: Every day | ORAL | Status: DC
  Administered 2017-03-18 – 2017-03-21 (×4): 250 mg via ORAL
  Filled 2017-03-18 (×3): qty 1

## 2017-03-18 NOTE — [place] FL2 (Signed)
[place] MEDICAID FL2 LEVEL OF CARE SCREENING TOOL     IDENTIFICATION  Patient Name: Laura [name] Birthdate: 1924-09-02 Sex: female Admission Date (Current Location): 03/17/2017  [place] and [place] Number:  Chiropodist and Address:  [facility], [address] [place], [place] [address]      Provider Number: [id number]  Attending Physician Name and Address:  Milagros Loll, MD  Relative Name and Phone Number:       Current Level of Care: Hospital Recommended Level of Care: Skilled Nursing Facility Prior Approval Number:    Date Approved/Denied:   PASRR Number: [id number]  Discharge Plan: SNF    Current Diagnoses: Patient Active Problem List   Diagnosis Date Noted  . Atrial fibrillation (HCC) 03/04/2017  . HTN (hypertension) 03/04/2017  . Hyponatremia 03/04/2017  . Acute CHF (HCC) 02/19/2017  . CHF (congestive heart failure) (HCC) 02/19/2017    Orientation RESPIRATION BLADDER Height & Weight     Self, Time, Situation, Place  Normal Continent Weight: 130 lb 3.2 oz (59.1 kg) Height:   (144.8 cm)  BEHAVIORAL SYMPTOMS/MOOD NEUROLOGICAL BOWEL NUTRITION STATUS      Continent Diet (Heart Healthy)  AMBULATORY STATUS COMMUNICATION OF NEEDS Skin   Extensive Assist Verbally Normal                       Personal Care Assistance Level of Assistance  Bathing, Feeding, Dressing Bathing Assistance: Limited assistance Feeding assistance: Independent Dressing Assistance: Limited assistance     Functional Limitations Info             SPECIAL CARE FACTORS FREQUENCY  PT (By licensed PT)     PT Frequency: Up to 5X per day, 5 days per week              Contractures Contractures Info: Present    Additional Factors Info  Code Status, Allergies Code Status Info: DNR Allergies Info: Penicillins, Codeine, Amlodipine, Ciprofloxacin, Sulfa Antibiotics           Current Medications (03/18/2017):  This is the  current hospital active medication list Current Facility-Administered Medications  Medication Dose Route Frequency Provider Last Rate Last Dose  . acetaminophen (TYLENOL) tablet 650 mg  650 mg Oral Q6H PRN Ramonita Lab, MD       Or  . acetaminophen (TYLENOL) suppository 650 mg  650 mg Rectal Q6H PRN Ramonita Lab, MD      . acetaminophen (TYLENOL) tablet 1,000 mg  1,000 mg Oral Daily Ramonita Lab, MD   1,000 mg at 03/18/17 0919  . aspirin EC tablet 81 mg  81 mg Oral Daily Ramonita Lab, MD   81 mg at 03/18/17 0918  . carvedilol (COREG) tablet 25 mg  25 mg Oral BID WC Ramonita Lab, MD   25 mg at 03/18/17 1707  . diltiazem (CARDIZEM CD) 24 hr capsule 240 mg  240 mg Oral Daily Ramonita Lab, MD   240 mg at 03/18/17 0920  . docusate sodium (COLACE) capsule 100 mg  100 mg Oral BID Ramonita Lab, MD   100 mg at 03/18/17 0921  . docusate sodium (COLACE) capsule 100 mg  100 mg Oral BID PRN Ramonita Lab, MD      . ferrous gluconate (FERGON) tablet 324 mg  324 mg Oral BID Ramonita Lab, MD   324 mg at 03/18/17 0924  . fluticasone (FLONASE) 50 MCG/ACT nasal spray 2 spray  2 spray Each Nare Daily Ramonita Lab, MD  2 spray at 03/18/17 0927  . folic acid (FOLVITE) tablet 0.5 mg  0.5 mg Oral Daily Aruna Gouru, MD      . folic acid (FOLVITE) tablet 0.5 mg  0.5 mg Oral Daily Aruna Gouru, MD   0.5 mg at 03/18/17 0921  . furosemide (LASIX) injection 60 mg  60 mg Intravenous Q8H Srikar Sudini, MD   60 mg at 03/18/17 1707  . heparin injection 5,000 Units  5,000 Units Subcutaneous Q12H Ramonita Lab, MD   5,000 Units at 03/18/17 0923  . hydrALAZINE (APRESOLINE) tablet 100 mg  100 mg Oral BID Ramonita Lab, MD   100 mg at 03/18/17 1610  . isosorbide mononitrate (IMDUR) 24 hr tablet 120 mg  120 mg Oral Daily Ramonita Lab, MD   120 mg at 03/18/17 0921  . lisinopril (PRINIVIL,ZESTRIL) tablet 10 mg  10 mg Oral Daily Ramonita Lab, MD   10 mg at 03/18/17 0920  . multivitamin-lutein (OCUVITE-LUTEIN) capsule 1 capsule  1 capsule Oral Daily Ramonita Lab, MD   1 capsule at 03/18/17 0922  . multivitamin-lutein (OCUVITE-LUTEIN) capsule   Oral Daily Aruna Gouru, MD      . ondansetron (ZOFRAN) tablet 4 mg  4 mg Oral Q6H PRN Ramonita Lab, MD       Or  . ondansetron (ZOFRAN) injection 4 mg  4 mg Intravenous Q6H PRN Aruna Gouru, MD      . pantoprazole (PROTONIX) EC tablet 40 mg  40 mg Oral Daily Ramonita Lab, MD   40 mg at 03/18/17 0920  . simvastatin (ZOCOR) tablet 40 mg  40 mg Oral Daily Ramonita Lab, MD   40 mg at 03/18/17 1707  . sodium chloride flush (NS) 0.9 % injection 3 mL  3 mL Intravenous Q12H Ramonita Lab, MD   3 mL at 03/18/17 0031  . traMADol (ULTRAM) tablet 50 mg  50 mg Oral Q12H PRN Ramonita Lab, MD      . vitamin C (ASCORBIC ACID) tablet 250 mg  250 mg Oral Daily Ramonita Lab, MD   250 mg at 03/18/17 9604   And  . vitamin B-12 (CYANOCOBALAMIN) tablet 100 mcg  100 mcg Oral Daily Ramonita Lab, MD   100 mcg at 03/18/17 5409     Discharge Medications: Please see discharge summary for a list of discharge medications.  Relevant Imaging Results:  Relevant Lab Results:   Additional Information SS# [id number]  Judi Cong, LCSW

## 2017-03-18 NOTE — Progress Notes (Signed)
Shift assessment completed. Pt is easily awakened, ivf was dc'd per md order and pt received iv lasix, also per md order. Pt is alert and oriented x3, pt is on room air, sat is 95%, respirations are unlabored. Pt has telebox in place, a fib is noted. Abdomen is soft, bs heard. Pt is oob to commode to void with assisst prn. piv #22 intact to L arm, site is free of redness and swelling. Ppp, no edema noted. Pt denied pain. Since assessment, pt has been oob to chair to eat breakfast. Dr.Sudini has rounded and spoken to pt's daughter by phone regarding plan of care as well. Pt became a little sob while taking her pills, but pt has some nasal congestion with clear discharge from nares, and pt describes some drainage down her throat as well. Pt told this Clinical research associate that this "happens sometimes". Pt remains oob to chair, call bell in reach.

## 2017-03-18 NOTE — Plan of Care (Signed)
Problem: Bowel/Gastric: Goal: Will not experience complications related to bowel motility Outcome: Progressing Pt is progressing toward goals, is aware of plan of care.

## 2017-03-18 NOTE — Evaluation (Signed)
Physical Therapy Evaluation Patient Details Name: Laura [name] MRN: [id number] DOB: 1924-07-21 Today's Date: 03/18/2017   History of Present Illness  Pt is a 83 y.o. female presenting to hospital from PCP with low sodium, increased weakness, and confusion.  Per notes, pt was seeing PCP for increasing SOB and weight gain.  Pt admitted to hospital with acute on chronic hyponatremia, hypovolemic likely from CHF and decreased oral intake.  PMH includes CHF, CKD, htn, and B TKR.  Clinical Impression  Prior to hospital admission, pt was ambulatory with rollator.  Pt lives alone at [facility]  Currently pt is SBA with bed mobility, CGA with transfers, and CGA to min assist with ambulation 70 feet with RW (limited distance d/t fatigue; pt with 3 loss of balance to R during ambulation requiring assist to correct balance/prevent fall; antalgic gait d/t L hip pain also noted).  Pt would benefit from skilled PT to address noted impairments and functional limitations.  Recommend pt discharge to STR when medically appropriate.    Follow Up Recommendations SNF    Equipment Recommendations  Rolling walker with 5" wheels    Recommendations for Other Services       Precautions / Restrictions Precautions Precautions: Fall Restrictions Weight Bearing Restrictions: No      Mobility  Bed Mobility Overal bed mobility: Needs Assistance Bed Mobility: Supine to Sit;Sit to Supine     Supine to sit: Supervision;HOB elevated Sit to supine: Supervision;HOB elevated   General bed mobility comments: use of bed rail; increased effort to perform  Transfers Overall transfer level: Needs assistance Equipment used: Rolling walker (2 wheeled) Transfers: Sit to/from Stand Sit to Stand: Min guard         General transfer comment: CGA to stand from bed using RW (x2 trials)  Ambulation/Gait Ambulation/Gait assistance: Min guard;Min assist Ambulation Distance (Feet): 70  Feet Assistive device: Rolling walker (2 wheeled)   Gait velocity: decreased   General Gait Details: antalgic gait; decreased stance time L LE; 3 loss of balance to R requiring min assist to steady  Stairs            Wheelchair Mobility    Modified Rankin (Stroke Patients Only)       Balance Overall balance assessment: History of Falls;Needs assistance Sitting-balance support: No upper extremity supported;Feet supported Sitting balance-Leahy Scale: Good     Standing balance support: Bilateral upper extremity supported (on RW) Standing balance-Leahy Scale: Fair Standing balance comment: static standing                             Pertinent Vitals/Pain Pain Assessment: 0-10 Pain Score: 4  Pain Location: chronic L hip pain Pain Descriptors / Indicators: Sore Pain Intervention(s): Limited activity within patient's tolerance;Monitored during session;Repositioned  Vitals (HR and O2 on room air) stable and WFL throughout treatment session.    Home Living Family/patient expects to be discharged to:: Other (Comment) [facility])               Home Equipment: Dan Humphreys - 4 wheels      Prior Function Level of Independence: Independent with assistive device(s)         Comments: Uses 4ww for functional mobility.  Reports 2 falls in last 6 months.  Chronic back and hip pain.  Per notes, pt with mild confusion baseline.     Hand Dominance        Extremity/Trunk  Assessment   Upper Extremity Assessment Upper Extremity Assessment: Generalized weakness    Lower Extremity Assessment Lower Extremity Assessment: Generalized weakness       Communication   Communication: HOH  Cognition Arousal/Alertness: Awake/alert Behavior During Therapy: WFL for tasks assessed/performed Overall Cognitive Status: Within Functional Limits for tasks assessed                                        General Comments General  comments (skin integrity, edema, etc.): Pt resting in bed upon PT arrival.  Nursing cleared pt for participation in physical therapy.  Pt agreeable to PT session.    Exercises     Assessment/Plan    PT Assessment Patient needs continued PT services  PT Problem List Decreased strength;Decreased balance;Decreased mobility;Pain       PT Treatment Interventions Gait training;Therapeutic exercise;Balance training;Functional mobility training;Therapeutic activities;Patient/family education    PT Goals (Current goals can be found in the Care Plan section)  Acute Rehab PT Goals Patient Stated Goal: to go home PT Goal Formulation: With patient Time For Goal Achievement: 04/01/17 Potential to Achieve Goals: Fair    Frequency Min 2X/week   Barriers to discharge Decreased caregiver support      Co-evaluation               End of Session Equipment Utilized During Treatment: Gait belt Activity Tolerance: Patient tolerated treatment well Patient left: in bed;with call bell/phone within reach;with bed alarm set Nurse Communication: Mobility status;Precautions PT Visit Diagnosis: Unsteadiness on feet (R26.81);Other abnormalities of gait and mobility (R26.89);Muscle weakness (generalized) (M62.81);History of falling (Z91.81);Pain Pain - Right/Left: Left Pain - part of body: Hip    Time: 1610-9604 PT Time Calculation (min) (ACUTE ONLY): 20 min   Charges:   PT Evaluation $PT Eval Low Complexity: 1 Procedure     PT G CodesHendricks Limes, PT 03/18/17, 4:50 PM [phone]

## 2017-03-18 NOTE — Progress Notes (Signed)
Patient is elderly w/ Hgb 8.8 and CrCl 16.5 ml/min  Will switch VTE prophylaxis from lovenox 30 mg subq to heparin 5000 units q12h  Thomasene Ripple, PharmD, BCPS Clinical Pharmacist 03/18/2017

## 2017-03-18 NOTE — Progress Notes (Signed)
[facility] - [place] at [facility]   PATIENT NAME: Laura [name]    MR#:  [id number]  DATE OF BIRTH:  1924/11/12  SUBJECTIVE:  CHIEF COMPLAINT:   Chief Complaint  Patient presents with  . Abnormal Lab   Feels better. Not confused. Eating well.  REVIEW OF SYSTEMS:    Review of Systems  Constitutional: Positive for malaise/fatigue. Negative for chills and fever.  HENT: Negative for sore throat.   Eyes: Negative for blurred vision, double vision and pain.  Respiratory: Positive for cough. Negative for hemoptysis, shortness of breath and wheezing.   Cardiovascular: Negative for chest pain, palpitations, orthopnea and leg swelling.  Gastrointestinal: Negative for abdominal pain, constipation, diarrhea, heartburn, nausea and vomiting.  Genitourinary: Negative for dysuria and hematuria.  Musculoskeletal: Negative for back pain and joint pain.  Skin: Negative for rash.  Neurological: Negative for sensory change, speech change, focal weakness and headaches.  Endo/Heme/Allergies: Does not bruise/bleed easily.  Psychiatric/Behavioral: Negative for depression. The patient is not nervous/anxious.     DRUG ALLERGIES:   Allergies  Allergen Reactions  . Penicillins Shortness Of Breath        . Codeine Itching  . Amlodipine Rash    Peripheral edema  . Ciprofloxacin Rash  . Sulfa Antibiotics Rash    VITALS:  Blood pressure (!) 160/74, pulse (!) 120, temperature 98.6 F (37 C), temperature source Oral, resp. rate 16, height  (1.448 m), weight 59.1 kg (130 lb 3.2 oz), SpO2 96 %.  PHYSICAL EXAMINATION:   Physical Exam  GENERAL:  83 y.o.-year-old patient lying in the bed with no acute distress.  EYES: Pupils equal, round, reactive to light and accommodation. No scleral icterus. Extraocular muscles intact.  HEENT: Head atraumatic, normocephalic. Oropharynx and nasopharynx clear.  NECK:  Supple, no jugular venous distention. No thyroid enlargement, no  tenderness.  LUNGS: Normal breath sounds bilaterally, no wheezing, rales, rhonchi. No use of accessory muscles of respiration.  CARDIOVASCULAR: S1, S2 normal. No murmurs, rubs, or gallops.  ABDOMEN: Soft, nontender, nondistended. Bowel sounds present. No organomegaly or mass.  EXTREMITIES: No cyanosis, clubbing or edema b/l.    NEUROLOGIC: Cranial nerves II through XII are intact. No focal Motor or sensory deficits b/l.   PSYCHIATRIC: The patient is alert and oriented x 3.  SKIN: No obvious rash, lesion, or ulcer.   LABORATORY PANEL:   CBC  Recent Labs Lab 03/18/17 0321  WBC 3.4*  HGB 7.8*  HCT 22.3*  PLT 175   ------------------------------------------------------------------------------------------------------------------ Chemistries   Recent Labs Lab 03/18/17 0321  NA 125*  126*  K 3.7  CL 95*  CO2 23  GLUCOSE 76  BUN 25*  CREATININE 1.58*  CALCIUM 8.3*  AST 16  ALT 11*  ALKPHOS 54  BILITOT 0.5   ------------------------------------------------------------------------------------------------------------------  Cardiac Enzymes No results for input(s): TROPONINI in the last 168 hours. ------------------------------------------------------------------------------------------------------------------  RADIOLOGY:  Dg Chest 2 View  Result Date: 03/17/2017 CLINICAL DATA:  Congestive heart failure.  Low sodium. EXAM: CHEST  2 VIEW COMPARISON:  02/22/2017 FINDINGS: Chronic cardiomegaly. Chronic aortic atherosclerosis with calcification and tortuosity. Small bilateral pleural effusions. Mild basilar atelectasis. Pulmonary venous hypertension without frank edema. IMPRESSION: Cardiomegaly.  Small effusions.  Basilar atelectasis. Electronically Signed   By: Paulina Fusi M.D.   On: 03/17/2017 18:34     ASSESSMENT AND PLAN:   Pammy Vesey  is a 83 y.o. female with a known history of Chronic hyponatremia baseline at 125-130 Admitted for hyponatremia and some  confusion   #  acute on chronic hyponatremia, hypovolemic    likely from her congestive heart failure and also decreased oral intake. His diet IV Lasix. Stop IV fluids. Repeat sodium today evening and tomorrow. Recent sodium check the nephrology office was 131. May need salt tablets. Consult nephrology if any worsening.   # acute on chronic diastolic CHF Start IV Lasix. Monitor input and output. Repeat labs in the morning. Monitor creatinine and potassium.  #Chronic kidney disease monitor renal function closely. Avoid nephrotoxins  #Essential hypertension continue home medications Imdur, Toprol, Coreg, Cardizem holding torsemide  #Chronic atrial fibrillation . Continue home medication aspirin, and Cardizem  # Mild cognitive impairment at baseline Watch for inpatient delirium  All the records are reviewed and case discussed with Care Management/Social Worker Management plans discussed with the patient, family and they are in agreement.  CODE STATUS: DNR  DVT Prophylaxis: SCDs  TOTAL TIME TAKING CARE OF THIS PATIENT: 30 minutes.   POSSIBLE D/C IN 1-2 DAYS, DEPENDING ON CLINICAL CONDITION.  Milagros Loll R M.D on 03/18/2017 at 11:05 AM  Between 7am to 6pm - Pager - [phone]  After 6pm go to [email or web address] - password [email or web address] [facility]  [facility] [place] [facility]  Office  [phone]  CC: Primary care physician; Marisue Ivan, MD  Note: This dictation was prepared with Dragon dictation along with smaller phrase technology. Any transcriptional errors that result from this process are unintentional.

## 2017-03-19 LAB — BASIC METABOLIC PANEL
Anion gap: 8 (ref 5–15)
BUN: 31 mg/dL — ABNORMAL HIGH (ref 6–20)
CHLORIDE: 92 mmol/L — AB (ref 101–111)
CO2: 25 mmol/L (ref 22–32)
CREATININE: 1.75 mg/dL — AB (ref 0.44–1.00)
Calcium: 8.7 mg/dL — ABNORMAL LOW (ref 8.9–10.3)
GFR calc non Af Amer: 24 mL/min — ABNORMAL LOW (ref >60)
GFR, EST AFRICAN AMERICAN: 28 mL/min — AB (ref >60)
Glucose, Bld: 93 mg/dL (ref 65–99)
POTASSIUM: 3.5 mmol/L (ref 3.5–5.1)
SODIUM: 125 mmol/L — AB (ref 135–145)

## 2017-03-19 MED ORDER — DARBEPOETIN ALFA 60 MCG/0.3ML IJ SOSY
60.0000 ug | PREFILLED_SYRINGE | INTRAMUSCULAR | Status: DC
  Administered 2017-03-19: 60 ug via SUBCUTANEOUS
  Filled 2017-03-19: qty 0.3

## 2017-03-19 MED ORDER — FUROSEMIDE 10 MG/ML IJ SOLN
40.0000 mg | Freq: Two times a day (BID) | INTRAMUSCULAR | Status: DC
  Administered 2017-03-19 – 2017-03-21 (×5): 40 mg via INTRAVENOUS
  Filled 2017-03-19 (×5): qty 4

## 2017-03-19 MED ORDER — EPOETIN ALFA 20000 UNIT/ML IJ SOLN
20000.0000 [IU] | INTRAMUSCULAR | Status: DC
  Filled 2017-03-19: qty 1

## 2017-03-19 NOTE — Clinical Social Work Placement (Signed)
   CLINICAL SOCIAL WORK PLACEMENT  NOTE  Date:  03/19/2017  Patient Details  Name: Laura [name] MRN: [id number] Date of Birth: [birth year]  Clinical Social Work is seeking post-discharge placement for this patient at the Skilled  Nursing Facility level of care (*CSW will initial, date and re-position this form in  chart as items are completed):  Yes   Patient/family provided with [place] Clinical Social Work Department's list of facilities offering this level of care within the geographic area requested by the patient (or if unable, by the patient's family).  Yes   Patient/family informed of their freedom to choose among providers that offer the needed level of care, that participate in Medicare, Medicaid or managed care program needed by the patient, have an available bed and are willing to accept the patient.  Yes   Patient/family informed of [facility]'s ownership interest in [facility] and [facility], as well as of the fact that they are under no obligation to receive care at these facilities.  PASRR submitted to EDS on       PASRR number received on       Existing PASRR number confirmed on 03/19/17     FL2 transmitted to all facilities in geographic area requested by pt/family on 03/19/17     FL2 transmitted to all facilities within larger geographic area on       Patient informed that his/her managed care company has contracts with or will negotiate with certain facilities, including the following:            Patient/family informed of bed offers received.  Patient chooses bed at       Physician recommends and patient chooses bed at      Patient to be transferred to   on  .  Patient to be transferred to facility by       Patient family notified on   of transfer.  Name of family member notified:        PHYSICIAN       Additional Comment:    _______________________________________________ Judi Cong, LCSW 03/19/2017, 4:14 PM

## 2017-03-19 NOTE — Progress Notes (Signed)
[facility]  ROUNDING NOTE   Subjective:  Patient last seen in our office on April 20. At that time serum sodium was low at 131. Patient also has history of chronic kidney disease stage IV. EGFR currently is 24. She presented now with increasing shortness of breath. She also had confusion upon admission.  Objective:  Vital signs in last 24 hours:  Temp:  [97.8 F (36.6 C)-98.1 F (36.7 C)] 97.8 F (36.6 C) (04/29 0736) Pulse Rate:  [68-118] 79 (04/29 0736) Resp:  [16-22] 16 (04/29 0736) BP: (133-159)/(61-101) 159/81 (04/29 0736) SpO2:  [96 %-98 %] 98 % (04/29 0736) Weight:  [57.8 kg (127 lb 8 oz)] 57.8 kg (127 lb 8 oz) (04/29 0506)  Weight change: 1.134 kg (2 lb 8 oz) Filed Weights   03/17/17 2223 03/18/17 0500 03/19/17 0506  Weight: 59.8 kg (131 lb 12.8 oz) 59.1 kg (130 lb 3.2 oz) 57.8 kg (127 lb 8 oz)    Intake/Output: I/O last 3 completed shifts: In: 1261.3 [I.V.:261.3; IV Piggyback:1000] Out: -    Intake/Output this shift:  No intake/output data recorded.  Physical Exam: General: No acute distress  Head: Normocephalic, atraumatic. Moist oral mucosal membranes  Eyes: Anicteric  Neck: Supple, trachea midline  Lungs:  Basilar rales, normal effort  Heart: S1S2 no rubs  Abdomen:  Soft, nontender, bowel sounds present  Extremities: trace peripheral edema.  Neurologic: Awake, alert, following commands  Skin: No lesions       Basic Metabolic Panel:  Recent Labs Lab 03/17/17 1625 03/17/17 2154 03/18/17 0321 03/18/17 1710 03/19/17 0336  NA 124* 125* 125*  126* 124* 125*  K 4.1  --  3.7  --  3.5  CL 91*  --  95*  --  92*  CO2 23  --  23  --  25  GLUCOSE 108*  --  76  --  93  BUN 30*  --  25*  --  31*  CREATININE 1.69*  --  1.58*  --  1.75*  CALCIUM 8.7*  --  8.3*  --  8.7*    Liver Function Tests:  Recent Labs Lab 03/17/17 1625 03/18/17 0321  AST 21 16  ALT 11* 11*  ALKPHOS 76 54  BILITOT 0.5 0.5  PROT 5.9* 5.0*  ALBUMIN 3.4*  2.9*   No results for input(s): LIPASE, AMYLASE in the last 168 hours. No results for input(s): AMMONIA in the last 168 hours.  CBC:  Recent Labs Lab 03/17/17 1625 03/18/17 0321  WBC 3.8 3.4*  HGB 8.8* 7.8*  HCT 25.4* 22.3*  MCV 89.9 90.3  PLT 191 175    Cardiac Enzymes: No results for input(s): CKTOTAL, CKMB, CKMBINDEX, TROPONINI in the last 168 hours.  BNP: Invalid input(s): POCBNP  CBG: No results for input(s): GLUCAP in the last 168 hours.  Microbiology: Results for orders placed or performed during the hospital encounter of 02/19/17  MRSA PCR Screening     Status: None   Collection Time: 02/19/17 11:30 PM  Result Value Ref Range Status   MRSA by PCR NEGATIVE NEGATIVE Final    Comment:        The GeneXpert MRSA Assay (FDA approved for NASAL specimens only), is one component of a comprehensive MRSA colonization surveillance program. It is not intended to diagnose MRSA infection nor to guide or monitor treatment for MRSA infections.     Coagulation Studies: No results for input(s): LABPROT, INR in the last 72 hours.  Urinalysis:  Recent Labs  03/17/17 1911  COLORURINE YELLOW*  LABSPEC 1.013  PHURINE 5.0  GLUCOSEU NEGATIVE  HGBUR NEGATIVE  BILIRUBINUR NEGATIVE  KETONESUR NEGATIVE  PROTEINUR 100*  NITRITE NEGATIVE  LEUKOCYTESUR NEGATIVE      Imaging: Dg Chest 2 View  Result Date: 03/17/2017 CLINICAL DATA:  Congestive heart failure.  Low sodium. EXAM: CHEST  2 VIEW COMPARISON:  02/22/2017 FINDINGS: Chronic cardiomegaly. Chronic aortic atherosclerosis with calcification and tortuosity. Small bilateral pleural effusions. Mild basilar atelectasis. Pulmonary venous hypertension without frank edema. IMPRESSION: Cardiomegaly.  Small effusions.  Basilar atelectasis. Electronically Signed   By: Nelson Chimes M.D.   On: 03/17/2017 18:34     Medications:    . acetaminophen  1,000 mg Oral Daily  . aspirin EC  81 mg Oral Daily  . carvedilol  25 mg Oral  BID WC  . diltiazem  240 mg Oral Daily  . docusate sodium  100 mg Oral BID  . ferrous gluconate  324 mg Oral BID  . fluticasone  2 spray Each Nare Daily  . folic acid  0.5 mg Oral Daily  . folic acid  0.5 mg Oral Daily  . furosemide  40 mg Intravenous BID  . heparin subcutaneous  5,000 Units Subcutaneous Q12H  . hydrALAZINE  100 mg Oral BID  . isosorbide mononitrate  120 mg Oral Daily  . lisinopril  10 mg Oral Daily  . multivitamin-lutein  1 capsule Oral Daily  . multivitamin-lutein   Oral Daily  . pantoprazole  40 mg Oral Daily  . simvastatin  40 mg Oral Daily  . sodium chloride flush  3 mL Intravenous Q12H  . vitamin C  250 mg Oral Daily   And  . vitamin B-12  100 mcg Oral Daily   acetaminophen **OR** acetaminophen, docusate sodium, ondansetron **OR** ondansetron (ZOFRAN) IV, traMADol  Assessment/ Plan:  83 y.o. female with hypertension, osteoporosis, GERD, anemia, superventricular tachycardia, chronic kidney disease stage IV, hypertension, hyponatremia, second hyperparathyroidism, and anemia of chronic kidney disease.   1. Hyponatremia. Patient has fluctuating serum sodium. Most recent serum sodium in our office on 03/10/2017 indicated a sodium of 131. She appears to have acute diastolic heart failure now. Agree with continuing Lasix for now. We could potentially consider ADH antagonist but we plan to hold off for right now. Recommend fluid restriction of 1000 cc per day.  2. Chronic kidney disease stage IV. Renal function overall appears to be relatively stable. Continue to monitor renal function and continue the patient on lisinopril.  3. Anemia of chronic kidney disease. Hemoglobin currently 7.8. No urgent indication for transfusion. We will go ahead and administer Epogen 20,000 units subcutaneous weekly.  4. Hypertension. Continue carvedilol, diltiazem, hydralazine, and lisinopril.      LOS: 2 Ruxin Ransome 4/29/20188:45 AM

## 2017-03-19 NOTE — Clinical Social Work Note (Signed)
Clinical Social Work Assessment  Patient Details  Name: Laura [name] MRN: [id number] Date of Birth: 05/13/1924  Date of referral:  03/19/17               Reason for consult:  Facility Placement                Permission sought to share information with:  Facility Contact Representative Permission granted to share information::  Yes, Verbal Permission Granted  Name::        Agency::     Relationship::     Contact Information:     Housing/Transportation Living arrangements for the past 2 months:  Apartment Source of Information:  Patient Patient Interpreter Needed:  None Criminal Activity/Legal Involvement Pertinent to Current Situation/Hospitalization:  No - Comment as needed Significant Relationships:  Adult Children, Other Family Members Lives with:  Self Do you feel safe going back to the place where you live?  Yes Need for family participation in patient care:  No (Coment)  Care giving concerns: PT recommendation for STR   Social Worker assessment / plan:  CSW met with the patient at bedside to discuss her discharge plan. The patient gave verbal permission to conduct a bed search, and she indicated that  [facility] is her preference. The CSW explained the referral process.  At baseline, the patient lives at [facility], and her plan is to return once she finishes STR. The patient has support from her adult child, sister, and niece.  Employment status:  Retired Insurance information:  Medicare PT Recommendations:  Skilled Nursing Facility Information / Referral to community resources:  Skilled Nursing Facility  Patient/Family's Response to care:  The patient and her family thanked the CSW.  Patient/Family's Understanding of and Emotional Response to Diagnosis, Current Treatment, and Prognosis:  The patient verbalized understanding of the discharge plan and is in agreement.  Emotional Assessment Appearance:  Appears stated  age Attitude/Demeanor/Rapport:   (Pleasant) Affect (typically observed):  Appropriate, Pleasant Orientation:  Oriented to Self, Oriented to Place, Oriented to  Time, Oriented to Situation Alcohol / Substance use:  Never Used Psych involvement (Current and /or in the community):  No (Comment)  Discharge Needs  Concerns to be addressed:  Care Coordination Readmission within the last 30 days:  No Current discharge risk:  None Barriers to Discharge:  Continued Medical Work up    M , LCSW 03/19/2017, 4:12 PM  

## 2017-03-19 NOTE — Progress Notes (Signed)
[facility] - [place] at [facility]   PATIENT NAME: Laura [name]    MR#:  [id number]  DATE OF BIRTH:  03-25-1924  SUBJECTIVE:  CHIEF COMPLAINT:   Chief Complaint  Patient presents with  . Abnormal Lab   Has chronic mild shortness of breath. Some improvement. Sitting up in a chair. Slept well. Afebrile. Good appetite.  REVIEW OF SYSTEMS:    Review of Systems  Constitutional: Positive for malaise/fatigue. Negative for chills and fever.  HENT: Negative for sore throat.   Eyes: Negative for blurred vision, double vision and pain.  Respiratory: Positive for cough. Negative for hemoptysis, shortness of breath and wheezing.   Cardiovascular: Negative for chest pain, palpitations, orthopnea and leg swelling.  Gastrointestinal: Negative for abdominal pain, constipation, diarrhea, heartburn, nausea and vomiting.  Genitourinary: Negative for dysuria and hematuria.  Musculoskeletal: Negative for back pain and joint pain.  Skin: Negative for rash.  Neurological: Negative for sensory change, speech change, focal weakness and headaches.  Endo/Heme/Allergies: Does not bruise/bleed easily.  Psychiatric/Behavioral: Negative for depression. The patient is not nervous/anxious.     DRUG ALLERGIES:   Allergies  Allergen Reactions  . Penicillins Shortness Of Breath        . Codeine Itching  . Amlodipine Rash    Peripheral edema  . Ciprofloxacin Rash  . Sulfa Antibiotics Rash    VITALS:  Blood pressure (!) 159/81, pulse 79, temperature 97.8 F (36.6 C), temperature source Oral, resp. rate 16, height  (1.448 m), weight 57.8 kg (127 lb 8 oz), SpO2 98 %.  PHYSICAL EXAMINATION:   Physical Exam  GENERAL:  83 y.o.-year-old patient lying in the bed with no acute distress.  EYES: Pupils equal, round, reactive to light and accommodation. No scleral icterus. Extraocular muscles intact.  HEENT: Head atraumatic, normocephalic. Oropharynx and nasopharynx clear.  NECK:   Supple, no jugular venous distention. No thyroid enlargement, no tenderness.  LUNGS: Normal breath sounds bilaterally, no wheezing, rales, rhonchi. No use of accessory muscles of respiration.  CARDIOVASCULAR: S1, S2 normal. No murmurs, rubs, or gallops.  ABDOMEN: Soft, nontender, nondistended. Bowel sounds present. No organomegaly or mass.  EXTREMITIES: No cyanosis, clubbing or edema b/l.    NEUROLOGIC: Cranial nerves II through XII are intact. No focal Motor or sensory deficits b/l.   PSYCHIATRIC: The patient is alert and awake. Oriented. SKIN: No obvious rash, lesion, or ulcer.   LABORATORY PANEL:   CBC  Recent Labs Lab 03/18/17 0321  WBC 3.4*  HGB 7.8*  HCT 22.3*  PLT 175   ------------------------------------------------------------------------------------------------------------------ Chemistries   Recent Labs Lab 03/18/17 0321  03/19/17 0336  NA 125*  126*  < > 125*  K 3.7  --  3.5  CL 95*  --  92*  CO2 23  --  25  GLUCOSE 76  --  93  BUN 25*  --  31*  CREATININE 1.58*  --  1.75*  CALCIUM 8.3*  --  8.7*  AST 16  --   --   ALT 11*  --   --   ALKPHOS 54  --   --   BILITOT 0.5  --   --   < > = values in this interval not displayed. ------------------------------------------------------------------------------------------------------------------  Cardiac Enzymes No results for input(s): TROPONINI in the last 168 hours. ------------------------------------------------------------------------------------------------------------------  RADIOLOGY:  Dg Chest 2 View  Result Date: 03/17/2017 CLINICAL DATA:  Congestive heart failure.  Low sodium. EXAM: CHEST  2 VIEW COMPARISON:  02/22/2017 FINDINGS: Chronic cardiomegaly.  Chronic aortic atherosclerosis with calcification and tortuosity. Small bilateral pleural effusions. Mild basilar atelectasis. Pulmonary venous hypertension without frank edema. IMPRESSION: Cardiomegaly.  Small effusions.  Basilar atelectasis.  Electronically Signed   By: Paulina Fusi M.D.   On: 03/17/2017 18:34     ASSESSMENT AND PLAN:   Laura [name]  is a 83 y.o. female with a known history of Chronic hyponatremia baseline at 125-130 Admitted for hyponatremia and some confusion   # Acute on chronic hyponatremia, hypervolemic   likely from her congestive heart failure and also decreased oral intake.  IV Lasix. Stopped IV fluids.  Recent sodium check the nephrology office was 131. May need salt tablets. No improvement in sodium. Likely remain hyponatremic. Discussed with Dr. Cherylann Ratel who will see the patient.   # Acute on chronic diastolic CHF Start IV Lasix. Monitor input and output. Repeat labs in the morning. Monitor creatinine and potassium.  # Chronic kidney disease monitor renal function closely. Avoid nephrotoxins  # Essential hypertension continue home medications Imdur, Toprol, Coreg, Cardizem holding torsemide  # Chronic atrial fibrillation . Continue home medication aspirin, and Cardizem  # Mild cognitive impairment at baseline Watch for inpatient delirium  All the records are reviewed and case discussed with Care Management/Social Worker Management plans discussed with the patient, family and they are in agreement.  CODE STATUS: DNR  DVT Prophylaxis: SCDs  TOTAL TIME TAKING CARE OF THIS PATIENT: 30 minutes.   POSSIBLE D/C IN 1-2 DAYS, DEPENDING ON CLINICAL CONDITION.  Milagros Loll R M.D on 03/19/2017 at 10:24 AM  Between 7am to 6pm - Pager - [phone]  After 6pm go to [email or web address] - password [email or web address] [facility]  [facility] [place] [facility]  Office  [phone]  CC: Primary care physician; Marisue Ivan, MD  Note: This dictation was prepared with Dragon dictation along with smaller phrase technology. Any transcriptional errors that result from this process are unintentional.

## 2017-03-19 NOTE — Progress Notes (Signed)
Telemetry tech notified nurse of 16 beat run of [place]. Patient resting in bed with no signs of distress. MD notified

## 2017-03-20 LAB — BASIC METABOLIC PANEL
Anion gap: 8 (ref 5–15)
BUN: 31 mg/dL — ABNORMAL HIGH (ref 6–20)
CALCIUM: 8.3 mg/dL — AB (ref 8.9–10.3)
CHLORIDE: 92 mmol/L — AB (ref 101–111)
CO2: 26 mmol/L (ref 22–32)
CREATININE: 1.71 mg/dL — AB (ref 0.44–1.00)
GFR calc Af Amer: 29 mL/min — ABNORMAL LOW (ref >60)
GFR calc non Af Amer: 25 mL/min — ABNORMAL LOW (ref >60)
GLUCOSE: 84 mg/dL (ref 65–99)
Potassium: 3.4 mmol/L — ABNORMAL LOW (ref 3.5–5.1)
Sodium: 126 mmol/L — ABNORMAL LOW (ref 135–145)

## 2017-03-20 MED ORDER — FLUTICASONE PROPIONATE 50 MCG/ACT NA SUSP
1.0000 | Freq: Every day | NASAL | Status: DC | PRN
  Administered 2017-03-20: 1 via NASAL
  Filled 2017-03-20: qty 16

## 2017-03-20 MED ORDER — POTASSIUM CHLORIDE CRYS ER 20 MEQ PO TBCR
40.0000 meq | EXTENDED_RELEASE_TABLET | Freq: Once | ORAL | Status: AC
  Administered 2017-03-20: 40 meq via ORAL
  Filled 2017-03-20: qty 2

## 2017-03-20 NOTE — Progress Notes (Signed)
[facility] [place] [facility]   PATIENT NAME: Laura [name]    MR#:  [id number]  DATE OF BIRTH:  [birth year]  SUBJECTIVE:  CHIEF COMPLAINT:   Chief Complaint  Patient presents with  . Abnormal Lab  was hoping to be released today. Sitting in the chair.  Tachycardic with a heart rate of 113. sodium 126, potassium 3.4 REVIEW OF SYSTEMS:    Review of Systems  Constitutional: Positive for malaise/fatigue. Negative for chills and fever.  HENT: Negative for sore throat.   Eyes: Negative for blurred vision, double vision and pain.  Respiratory: Positive for cough. Negative for hemoptysis, shortness of breath and wheezing.   Cardiovascular: Negative for chest pain, palpitations, orthopnea and leg swelling.  Gastrointestinal: Negative for abdominal pain, constipation, diarrhea, heartburn, nausea and vomiting.  Genitourinary: Negative for dysuria and hematuria.  Musculoskeletal: Negative for back pain and joint pain.  Skin: Negative for rash.  Neurological: Negative for sensory change, speech change, focal weakness and headaches.  Endo/Heme/Allergies: Does not bruise/bleed easily.  Psychiatric/Behavioral: Negative for depression. The patient is not nervous/anxious.    DRUG ALLERGIES:   Allergies  Allergen Reactions  . Penicillins Shortness Of Breath        . Codeine Itching  . Amlodipine Rash    Peripheral edema  . Ciprofloxacin Rash  . Sulfa Antibiotics Rash    VITALS:  Blood pressure (!) 149/95, pulse (!) 113, temperature 98.4 F (36.9 C), temperature source Oral, resp. rate 18, height  (1.448 m), weight 59.2 kg (130 lb 8 oz), SpO2 97 %.  PHYSICAL EXAMINATION:   Physical Exam  GENERAL:  83 y.o.-year-old patient lying in the bed with no acute distress.  EYES: Pupils equal, round, reactive to light and accommodation. No scleral icterus. Extraocular muscles intact.  HEENT: Head atraumatic, normocephalic. Oropharynx and nasopharynx clear.  NECK:   Supple, no jugular venous distention. No thyroid enlargement, no tenderness.  LUNGS: Normal breath sounds bilaterally, no wheezing, rales, rhonchi. No use of accessory muscles of respiration.  CARDIOVASCULAR: S1, S2 normal. No murmurs, rubs, or gallops.  ABDOMEN: Soft, nontender, nondistended. Bowel sounds present. No organomegaly or mass.  EXTREMITIES: No cyanosis, clubbing or edema b/l.    NEUROLOGIC: Cranial nerves II through XII are intact. No focal Motor or sensory deficits b/l.   PSYCHIATRIC: The patient is alert and awake. Oriented. SKIN: No obvious rash, lesion, or ulcer.  LABORATORY PANEL:   CBC  Recent Labs Lab 03/18/17 0321  WBC 3.4*  HGB 7.8*  HCT 22.3*  PLT 175   ------------------------------------------------------------------------------------------------------------------ Chemistries   Recent Labs Lab 03/18/17 0321  03/20/17 0357  NA 125*  126*  < > 126*  K 3.7  < > 3.4*  CL 95*  < > 92*  CO2 23  < > 26  GLUCOSE 76  < > 84  BUN 25*  < > 31*  CREATININE 1.58*  < > 1.71*  CALCIUM 8.3*  < > 8.3*  AST 16  --   --   ALT 11*  --   --   ALKPHOS 54  --   --   BILITOT 0.5  --   --   < > = values in this interval not displayed. ------------------------------------------------------------------------------------------------------------------  Cardiac Enzymes No results for input(s): TROPONINI in the last 168 hours. ------------------------------------------------------------------------------------------------------------------  RADIOLOGY:  No results found.   ASSESSMENT AND PLAN:  Laura [name]  is a 83 y.o. female with a known history of Chronic hyponatremia baseline at 125-130  Admitted for hyponatremia and some confusion  # Acute on chronic hyponatremia, hypervolemic   likely from her congestive heart failure and also decreased oral intake. - fluid restriction per nephrology - Sodium went upto 126  * hypokalemia - Replete and recheck   # Acute  on chronic diastolic CHF - Monitor input and output. Monitor creatinine and potassium.  # Chronic kidney disease stage IV: at her baseline - monitor renal function closely. Avoid nephrotoxins  # Essential hypertension continue home medications Imdur, Toprol, Coreg, Cardizem holding torsemide  # Chronic atrial fibrillation . Continue home medication aspirin - continue Coreg and Cardizem for rate control  # Mild cognitive impairment at baseline Watch for inpatient delirium  All the records are reviewed and case discussed with Care Management/Social Worker Management plans discussed with the patient, nursing and they are in agreement.  CODE STATUS: DNR  DVT Prophylaxis: SCDs  TOTAL TIME TAKING CARE OF THIS PATIENT: 20 minutes.   POSSIBLE D/C IN 1-2 DAYS, DEPENDING ON CLINICAL CONDITION.would like to see her sodium, and her heart rate stabilized before discharge.  Delfino Lovett M.D on 03/20/2017 at 11:18 AM  Between 7am to 6pm - Pager - [phone]  After 6pm go to [email or web address] - password [email or web address] [facility]  [facility]  Office  [phone]  CC: Primary care physician; Marisue Ivan, MD  Note: This dictation was prepared with Dragon dictation along with smaller phrase technology. Any transcriptional errors that result from this process are unintentional.

## 2017-03-20 NOTE — Progress Notes (Signed)
[facility]  ROUNDING NOTE   Subjective:   Patient working with physical therapy today.   Objective:  Vital signs in last 24 hours:  Temp:  [98 F (36.7 C)-98.4 F (36.9 C)] 98.4 F (36.9 C) (04/30 0724) Pulse Rate:  [98-144] 113 (04/30 0725) Resp:  [18-20] 18 (04/30 0724) BP: (124-149)/(60-95) 149/95 (04/30 0724) SpO2:  [94 %-98 %] 97 % (04/30 0725) Weight:  [59.2 kg (130 lb 8 oz)] 59.2 kg (130 lb 8 oz) (04/30 0502)  Weight change: 1.361 kg (3 lb) Filed Weights   03/18/17 0500 03/19/17 0506 03/20/17 0502  Weight: 59.1 kg (130 lb 3.2 oz) 57.8 kg (127 lb 8 oz) 59.2 kg (130 lb 8 oz)    Intake/Output: I/O last 3 completed shifts: In: 483 [P.O.:480; I.V.:3] Out: -    Intake/Output this shift:  Total I/O In: 240 [P.O.:240] Out: -   Physical Exam: General: No acute distress  Head: Normocephalic, atraumatic. Moist oral mucosal membranes  Eyes: Anicteric  Neck: Supple, trachea midline  Lungs:  Basilar rales, normal effort  Heart: S1S2 no rubs  Abdomen:  Soft, nontender, bowel sounds present  Extremities: trace peripheral edema.  Neurologic: Awake, alert, following commands  Skin: No lesions       Basic Metabolic Panel:  Recent Labs Lab 03/17/17 1625 03/17/17 2154 03/18/17 0321 03/18/17 1710 03/19/17 0336 03/20/17 0357  NA 124* 125* 125*  126* 124* 125* 126*  K 4.1  --  3.7  --  3.5 3.4*  CL 91*  --  95*  --  92* 92*  CO2 23  --  23  --  25 26  GLUCOSE 108*  --  76  --  93 84  BUN 30*  --  25*  --  31* 31*  CREATININE 1.69*  --  1.58*  --  1.75* 1.71*  CALCIUM 8.7*  --  8.3*  --  8.7* 8.3*    Liver Function Tests:  Recent Labs Lab 03/17/17 1625 03/18/17 0321  AST 21 16  ALT 11* 11*  ALKPHOS 76 54  BILITOT 0.5 0.5  PROT 5.9* 5.0*  ALBUMIN 3.4* 2.9*   No results for input(s): LIPASE, AMYLASE in the last 168 hours. No results for input(s): AMMONIA in the last 168 hours.  CBC:  Recent Labs Lab 03/17/17 1625 03/18/17 0321   WBC 3.8 3.4*  HGB 8.8* 7.8*  HCT 25.4* 22.3*  MCV 89.9 90.3  PLT 191 175    Cardiac Enzymes: No results for input(s): CKTOTAL, CKMB, CKMBINDEX, TROPONINI in the last 168 hours.  BNP: Invalid input(s): POCBNP  CBG: No results for input(s): GLUCAP in the last 168 hours.  Microbiology: Results for orders placed or performed during the hospital encounter of 02/19/17  MRSA PCR Screening     Status: None   Collection Time: 02/19/17 11:30 PM  Result Value Ref Range Status   MRSA by PCR NEGATIVE NEGATIVE Final    Comment:        The GeneXpert MRSA Assay (FDA approved for NASAL specimens only), is one component of a comprehensive MRSA colonization surveillance program. It is not intended to diagnose MRSA infection nor to guide or monitor treatment for MRSA infections.     Coagulation Studies: No results for input(s): LABPROT, INR in the last 72 hours.  Urinalysis:  Recent Labs  03/17/17 1911  COLORURINE YELLOW*  LABSPEC 1.013  PHURINE 5.0  GLUCOSEU NEGATIVE  HGBUR NEGATIVE  BILIRUBINUR NEGATIVE  KETONESUR NEGATIVE  PROTEINUR 100*  NITRITE NEGATIVE  LEUKOCYTESUR NEGATIVE      Imaging: No results found.   Medications:    . acetaminophen  1,000 mg Oral Daily  . aspirin EC  81 mg Oral Daily  . carvedilol  25 mg Oral BID WC  . darbepoetin (ARANESP) injection - NON-DIALYSIS  60 mcg Subcutaneous Weekly  . diltiazem  240 mg Oral Daily  . docusate sodium  100 mg Oral BID  . ferrous gluconate  324 mg Oral BID  . fluticasone  2 spray Each Nare Daily  . folic acid  0.5 mg Oral Daily  . folic acid  0.5 mg Oral Daily  . furosemide  40 mg Intravenous BID  . heparin subcutaneous  5,000 Units Subcutaneous Q12H  . hydrALAZINE  100 mg Oral BID  . isosorbide mononitrate  120 mg Oral Daily  . lisinopril  10 mg Oral Daily  . multivitamin-lutein  1 capsule Oral Daily  . multivitamin-lutein   Oral Daily  . pantoprazole  40 mg Oral Daily  . simvastatin  40 mg Oral  Daily  . sodium chloride flush  3 mL Intravenous Q12H  . vitamin C  250 mg Oral Daily   And  . vitamin B-12  100 mcg Oral Daily   acetaminophen **OR** acetaminophen, docusate sodium, ondansetron **OR** ondansetron (ZOFRAN) IV, traMADol  Assessment/ Plan:  83 y.o.white female with hypertension, osteoporosis, GERD, anemia, superventricular tachycardia, chronic kidney disease stage IV, hypertension, hyponatremia, second hyperparathyroidism, and anemia of chronic kidney disease.   1. Hyponatremia. Fluctuates with fluid status. Secondary to diastolic congestive failure.  - Continue fluid restriction: - Furosemide IV  2. Chronic kidney disease stage IV: creatinine at baseline.  - Continue lisinopril - Renally dose all medications.   3. Anemia of chronic kidney disease. Hemoglobin 7.8 - Aranesp given on 4/29  4. Hypertension: history of difficult to control. Currently at goal.  - Continue carvedilol, diltiazem, furosemide, hydralazine, imdur, and lisinopril.      LOS: 3 Laura [name] 4/30/20181:42 PM

## 2017-03-20 NOTE — Progress Notes (Addendum)
Clinical Education officer, museum (CSW) met with patient and presented bed offers. Patient chose Laura [name]. Per MD patient will likely D/C tomorrow pending medical clearance. Deborah admissions coordinator at [facility] is aware of above. CSW will continue to follow and assist as needed.   Patient's daughter [name] is agreeable for patient to D/C to [facility] tomorrow to a semi-private room.   [employer], LCSW [phone]

## 2017-03-20 NOTE — Progress Notes (Signed)
Physical Therapy Treatment Patient Details Name: Laura [name] MRN: [id number] DOB: Apr 02, 1924 Today's Date: 03/20/2017    History of Present Illness Pt is a 83 y.o. female presenting to hospital from PCP with low sodium, increased weakness, and confusion.  Per notes, pt was seeing PCP for increasing SOB and weight gain.  Pt admitted to hospital with acute on chronic hyponatremia, hypovolemic likely from CHF and decreased oral intake.  PMH includes CHF, CKD, htn, and B TKR.    PT Comments    Pt sitting in recliner watching TV upon entry. Pt very pleasant and agreeable to PT session. Pt required CGA for transfers and CGA to min assist for ambulation d/t LOB x2 toward the R side. Pt required CGA to min assist for moderate level balance activities in standing with LOB x2 to the R side. Pt tolerated exercises well in the chair at end of session. PT will attempt to perform a standardized balance assessment during next session and attempt to increase ambulation distance. Current recommendations remain appropriate.    Follow Up Recommendations  SNF     Equipment Recommendations  Rolling walker with 5" wheels    Recommendations for Other Services       Precautions / Restrictions Precautions Precautions: Fall Restrictions Weight Bearing Restrictions: No    Mobility  Bed Mobility Overal bed mobility: Needs Assistance Bed Mobility:  (Not attempted d/t pt starting and ending session in recliner.)              Transfers Overall transfer level: Needs assistance Equipment used: Rolling walker (2 wheeled) Transfers: Sit to/from Stand Sit to Stand: Min guard         General transfer comment: CGA to stand from chair using RW  Ambulation/Gait Ambulation/Gait assistance: Min guard;Min assist Ambulation Distance (Feet): 90 Feet Assistive device: Rolling walker (2 wheeled) Gait Pattern/deviations: Antalgic;Decreased stance time - left Gait velocity: decreased Gait velocity  interpretation: Below normal speed for age/gender General Gait Details: 2 loss of balance to R requiring min assist to steady; R knee bent grossly to 20 degrees in stance phase; L LE circumducting in swing phase; v/c's provided to step into the RW during ambulation for safety (pt tends to walk behind the RW)   Stairs            Wheelchair Mobility    Modified Rankin (Stroke Patients Only)       Balance Overall balance assessment: History of Falls;Needs assistance Sitting-balance support: No upper extremity supported;Feet supported Sitting balance-Leahy Scale: Good     Standing balance support: Bilateral upper extremity supported (on RW) Standing balance-Leahy Scale: Fair Standing balance comment: pt stood with feet together w/o support for 10 seconds w/o LOB; pt stood in tandem stance R and L and lost her balance to the R immediately after letting go of the RW on both sides and required min assist to correct balance with RW                            Cognition Arousal/Alertness: Awake/alert Behavior During Therapy: WFL for tasks assessed/performed Overall Cognitive Status: Within Functional Limits for tasks assessed                                        Exercises General Exercises - Lower Extremity Long Arc Quad: AROM;Both;10 reps;Seated;Strengthening Straight Leg Raises: AROM;Both;10 reps;Seated;Strengthening (with  chair reclined) Hip Flexion/Marching: AROM;Both;10 reps;Seated;Strengthening    General Comments        Pertinent Vitals/Pain Pain Assessment: No/denies pain    Home Living                      Prior Function            PT Goals (current goals can now be found in the care plan section) Acute Rehab PT Goals Patient Stated Goal: to go home PT Goal Formulation: With patient Time For Goal Achievement: 04/01/17 Potential to Achieve Goals: Good Additional Goals Additional Goal #1: Perform objective balance  assessment.    Frequency    Min 2X/week      PT Plan Current plan remains appropriate    Co-evaluation              AM-PAC PT "6 Clicks" Daily Activity  Outcome Measure  Difficulty turning over in bed (including adjusting bedclothes, sheets and blankets)?: A Little Difficulty moving from lying on back to sitting on the side of the bed? : A Lot Difficulty sitting down on and standing up from a chair with arms (e.g., wheelchair, bedside commode, etc,.)?: A Little Help needed moving to and from a bed to chair (including a wheelchair)?: A Little Help needed walking in hospital room?: A Little Help needed climbing 3-5 steps with a railing? : A Little 6 Click Score: 17    End of Session Equipment Utilized During Treatment: Gait belt Activity Tolerance: Patient tolerated treatment well Patient left: in chair;with call bell/phone within reach;with chair alarm set Nurse Communication: Mobility status;Precautions (via white board) PT Visit Diagnosis: Unsteadiness on feet (R26.81);Other abnormalities of gait and mobility (R26.89);Muscle weakness (generalized) (M62.81);History of falling (Z91.81)     Time: 1610-9604 PT Time Calculation (min) (ACUTE ONLY): 26 min  Charges:                       G Codes:        Mayli Covington, SPT 03/20/2017, 1:23 PM

## 2017-03-21 ENCOUNTER — Ambulatory Visit: Payer: Medicare Other | Admitting: *Deleted

## 2017-03-21 DIAGNOSIS — R9431 Abnormal electrocardiogram [ECG] [EKG]: Secondary | ICD-10-CM | POA: Diagnosis not present

## 2017-03-21 DIAGNOSIS — I480 Paroxysmal atrial fibrillation: Secondary | ICD-10-CM | POA: Diagnosis not present

## 2017-03-21 DIAGNOSIS — N184 Chronic kidney disease, stage 4 (severe): Secondary | ICD-10-CM | POA: Diagnosis not present

## 2017-03-21 DIAGNOSIS — D508 Other iron deficiency anemias: Secondary | ICD-10-CM | POA: Diagnosis not present

## 2017-03-21 DIAGNOSIS — E871 Hypo-osmolality and hyponatremia: Secondary | ICD-10-CM | POA: Diagnosis not present

## 2017-03-21 DIAGNOSIS — I5032 Chronic diastolic (congestive) heart failure: Secondary | ICD-10-CM | POA: Diagnosis not present

## 2017-03-21 DIAGNOSIS — E1122 Type 2 diabetes mellitus with diabetic chronic kidney disease: Secondary | ICD-10-CM | POA: Diagnosis not present

## 2017-03-21 DIAGNOSIS — Z7401 Bed confinement status: Secondary | ICD-10-CM | POA: Diagnosis not present

## 2017-03-21 DIAGNOSIS — R109 Unspecified abdominal pain: Secondary | ICD-10-CM | POA: Diagnosis not present

## 2017-03-21 DIAGNOSIS — R1084 Generalized abdominal pain: Secondary | ICD-10-CM | POA: Diagnosis not present

## 2017-03-21 DIAGNOSIS — M1612 Unilateral primary osteoarthritis, left hip: Secondary | ICD-10-CM | POA: Diagnosis not present

## 2017-03-21 DIAGNOSIS — E876 Hypokalemia: Secondary | ICD-10-CM | POA: Diagnosis not present

## 2017-03-21 DIAGNOSIS — I4891 Unspecified atrial fibrillation: Secondary | ICD-10-CM | POA: Diagnosis not present

## 2017-03-21 DIAGNOSIS — R4182 Altered mental status, unspecified: Secondary | ICD-10-CM | POA: Diagnosis not present

## 2017-03-21 DIAGNOSIS — J9811 Atelectasis: Secondary | ICD-10-CM | POA: Diagnosis not present

## 2017-03-21 DIAGNOSIS — M6281 Muscle weakness (generalized): Secondary | ICD-10-CM | POA: Diagnosis not present

## 2017-03-21 DIAGNOSIS — I509 Heart failure, unspecified: Secondary | ICD-10-CM | POA: Diagnosis not present

## 2017-03-21 DIAGNOSIS — R600 Localized edema: Secondary | ICD-10-CM | POA: Diagnosis not present

## 2017-03-21 DIAGNOSIS — M25552 Pain in left hip: Secondary | ICD-10-CM | POA: Diagnosis not present

## 2017-03-21 DIAGNOSIS — I10 Essential (primary) hypertension: Secondary | ICD-10-CM | POA: Diagnosis not present

## 2017-03-21 DIAGNOSIS — J3089 Other allergic rhinitis: Secondary | ICD-10-CM | POA: Diagnosis not present

## 2017-03-21 LAB — BASIC METABOLIC PANEL
Anion gap: 8 (ref 5–15)
BUN: 34 mg/dL — AB (ref 6–20)
CALCIUM: 8.6 mg/dL — AB (ref 8.9–10.3)
CHLORIDE: 92 mmol/L — AB (ref 101–111)
CO2: 26 mmol/L (ref 22–32)
CREATININE: 1.88 mg/dL — AB (ref 0.44–1.00)
GFR calc Af Amer: 26 mL/min — ABNORMAL LOW (ref >60)
GFR calc non Af Amer: 22 mL/min — ABNORMAL LOW (ref >60)
GLUCOSE: 90 mg/dL (ref 65–99)
Potassium: 4 mmol/L (ref 3.5–5.1)
Sodium: 126 mmol/L — ABNORMAL LOW (ref 135–145)

## 2017-03-21 LAB — CBC
HEMATOCRIT: 23.9 % — AB (ref 35.0–47.0)
HEMOGLOBIN: 8.2 g/dL — AB (ref 12.0–16.0)
MCH: 31.2 pg (ref 26.0–34.0)
MCHC: 34.2 g/dL (ref 32.0–36.0)
MCV: 91 fL (ref 80.0–100.0)
Platelets: 182 10*3/uL (ref 150–440)
RBC: 2.62 MIL/uL — ABNORMAL LOW (ref 3.80–5.20)
RDW: 13.5 % (ref 11.5–14.5)
WBC: 2.9 10*3/uL — ABNORMAL LOW (ref 3.6–11.0)

## 2017-03-21 NOTE — Discharge Instructions (Signed)
Hyponatremia  Hyponatremia is when the amount of salt (sodium) in your blood is too low. When salt levels are low, your cells absorb extra water and they swell. The swelling happens throughout the body, but it mostly affects the brain.  Follow these instructions at home:  · Take medicines only as told by your doctor. Many medicines can make this condition worse. Talk with your doctor about any medicines that you are currently taking.  · Carefully follow a recommended diet as told by your doctor.  · Carefully follow instructions from your doctor about fluid restrictions.  · Keep all follow-up visits as told by your doctor. This is important.  · Do not drink alcohol.  Contact a doctor if:  · You feel sicker to your stomach (nauseous).  · You feel more confused.  · You feel more tired (fatigued).  · Your headache gets worse.  · You feel weaker.  · Your symptoms go away and then they come back.  · You have trouble following the diet instructions.  Get help right away if:  · You start to twitch and shake (have a seizure).  · You pass out (faint).  · You keep having watery poop (diarrhea).  · You keep throwing up (vomiting).  This information is not intended to replace advice given to you by your health care provider. Make sure you discuss any questions you have with your health care provider.  Document Released: 07/20/2011 Document Revised: 04/14/2016 Document Reviewed: 11/03/2014  Elsevier Interactive Patient Education © 2017 [employer]

## 2017-03-21 NOTE — Care Management Note (Signed)
Case Management Note  Patient Details  Name: Laura [name] MRN: [id number] Date of Birth: [birth year]  Subjective/Objective:  Notified [facility] that patient would be discharging to [facility].                    Action/Plan:   Expected Discharge Date:  03/21/17               Expected Discharge Plan:  Skilled Nursing Facility  In-House Referral:  Clinical Social Work  Discharge planning Services  CM Consult  Post Acute Care Choice:    Choice offered to:     DME Arranged:    DME Agency:     HH Arranged:    HH Agency:     Status of Service:  Completed, signed off  If discussed at [employer], dates discussed:    Additional Comments:  [name], RN 03/21/2017, 10:55 AM

## 2017-03-21 NOTE — Discharge Summary (Signed)
[facility] [place] [facility]   PATIENT NAME: Laura [name]    MR#:  [id number]  DATE OF BIRTH:  21-May-1924  DATE OF ADMISSION:  03/17/2017   ADMITTING PHYSICIAN: Ramonita Lab, MD  DATE OF DISCHARGE: 03/21/2017  PRIMARY CARE PHYSICIAN: Marisue Ivan, MD   ADMISSION DIAGNOSIS:  Hyponatremia [E87.1] Weakness [R53.1] DISCHARGE DIAGNOSIS:  Active Problems:   Hyponatremia  SECONDARY DIAGNOSIS:   Past Medical History:  Diagnosis Date  . Anemia   . Arrhythmia    atrial fibrillation  . CHF (congestive heart failure) (HCC)   . Chronic kidney disease   . GERD (gastroesophageal reflux disease)   . GI bleed   . High cholesterol   . Hypertension   . Hyponatremia   . Macular degeneration   . SVT (supraventricular tachycardia) [facility])    HOSPITAL COURSE:  Laura [name] a 83 y.o. femalewith a known history of Chronic hyponatremia baseline at 125-130 Admitted for hyponatremia and some confusion  # Acute on chronic hyponatremia, hypervolemic - Sodium is 126, per nephrology .  She is asymptomatic and this does not need to be treated at this point.  * hypokalemia - Repleted and resolved  # Acute on chronic diastolic CHF - Now compensated at this time  # Chronic kidney disease stage IV: at her baseline  # Essential hypertension continue home medications Imdur, Toprol, Coreg, Cardizem, torsemide  # Chronic atrial fibrillation . Continue home medication aspirin - continue Coreg and Cardizem for rate control  # Mild cognitive impairment at baseline: Avoid narcotics and tramadol if possible DISCHARGE CONDITIONS:  Fair CONSULTS OBTAINED:   DRUG ALLERGIES:   Allergies  Allergen Reactions  . Penicillins Shortness Of Breath        . Codeine Itching  . Amlodipine Rash    Peripheral edema  . Ciprofloxacin Rash  . Sulfa Antibiotics Rash   DISCHARGE MEDICATIONS:   Allergies as of 03/21/2017      Reactions   Penicillins Shortness Of  Breath      Codeine Itching   Amlodipine Rash   Peripheral edema   Ciprofloxacin Rash   Sulfa Antibiotics Rash      Medication List    STOP taking these medications   traMADol 50 MG tablet Commonly known as:  ULTRAM     TAKE these medications   acetaminophen 500 MG tablet Commonly known as:  TYLENOL Take 1,000 mg by mouth daily. Take 2 tabs ( ) by mouth every morning.  Take 2 tablets in evening if needed.   aspirin EC 81 MG tablet Take 81 mg by mouth daily.   carvedilol 25 MG tablet Commonly known as:  COREG Take 1 tablet (25 mg total) by mouth 2 (two) times daily with a meal.   diltiazem 240 MG 24 hr capsule Commonly known as:  CARDIZEM CD Take 1 capsule (240 mg total) by mouth daily.   docusate sodium 100 MG capsule Commonly known as:  COLACE Take 100 mg by mouth 2 (two) times daily.   Ferrous Gluconate 324 (37.5 Fe) MG Tabs Take 1 tablet by mouth 2 (two) times daily.   fluticasone 50 MCG/ACT nasal spray Commonly known as:  FLONASE Place 2 sprays into both nostrils daily.   hydrALAZINE 100 MG tablet Commonly known as:  APRESOLINE Take 100 mg by mouth 2 (two) times daily.   isosorbide mononitrate 120 MG 24 hr tablet Commonly known as:  IMDUR Take 1 tablet by mouth daily.   lisinopril 10 MG tablet Commonly  known as:  PRINIVIL,ZESTRIL Take 10 mg by mouth daily.   Lutein 20 MG Tabs Take 1 tablet by mouth daily.   pantoprazole 40 MG tablet Commonly known as:  PROTONIX Take 40 mg by mouth daily.   PRESERVISION AREDS PO Take by mouth daily. Pt taking one a day   simvastatin 40 MG tablet Commonly known as:  ZOCOR Take 40 mg by mouth daily.   torsemide 20 MG tablet Commonly known as:  DEMADEX Take 20 mg by mouth daily.   TRIGELS-F FORTE 460-60-0.01-1 MG Caps capsule Generic drug:  Fe Fum-Vit C-Vit B12-FA Take 1 tablet by mouth daily.      DISCHARGE INSTRUCTIONS:   DIET:  Cardiac diet DISCHARGE CONDITION:  Fair ACTIVITY:  Activity as  tolerated OXYGEN:  Home Oxygen: No.  Oxygen Delivery: room air DISCHARGE LOCATION:  nursing home With palliative care evaluation.  While at the facility  If you experience worsening of your admission symptoms, develop shortness of breath, life threatening emergency, suicidal or homicidal thoughts you must seek medical attention immediately by calling 911 or calling your MD immediately  if symptoms less severe.  You Must read complete instructions/literature along with all the possible adverse reactions/side effects for all the Medicines you take and that have been prescribed to you. Take any new Medicines after you have completely understood and accpet all the possible adverse reactions/side effects.   Please note  You were cared for by a hospitalist during your hospital stay. If you have any questions about your discharge medications or the care you received while you were in the hospital after you are discharged, you can call the unit and asked to speak with the hospitalist on call if the hospitalist that took care of you is not available. Once you are discharged, your primary care physician will handle any further medical issues. Please note that NO REFILLS for any discharge medications will be authorized once you are discharged, as it is imperative that you return to your primary care physician (or establish a relationship with a primary care physician if you do not have one) for your aftercare needs so that they can reassess your need for medications and monitor your lab values.    On the day of Discharge:  VITAL SIGNS:  Blood pressure (!) 136/101, pulse 73, temperature 98.3 F (36.8 C), temperature source Oral, resp. rate 18, height  (1.448 m), weight 59.2 kg (130 lb 8 oz), SpO2 94 %. PHYSICAL EXAMINATION:  GENERAL:  83 y.o.-year-old patient lying in the bed with no acute distress.  EYES: Pupils equal, round, reactive to light and accommodation. No scleral icterus. Extraocular  muscles intact.  HEENT: Head atraumatic, normocephalic. Oropharynx and nasopharynx clear.  NECK:  Supple, no jugular venous distention. No thyroid enlargement, no tenderness.  LUNGS: Normal breath sounds bilaterally, no wheezing, rales,rhonchi or crepitation. No use of accessory muscles of respiration.  CARDIOVASCULAR: S1, S2 normal. No murmurs, rubs, or gallops.  ABDOMEN: Soft, non-tender, non-distended. Bowel sounds present. No organomegaly or mass.  EXTREMITIES: No pedal edema, cyanosis, or clubbing.  NEUROLOGIC: Cranial nerves II through XII are intact. Muscle strength 5/5 in all extremities. Sensation intact. Gait not checked.  PSYCHIATRIC: The patient is alert and oriented x 3.  SKIN: No obvious rash, lesion, or ulcer.  DATA REVIEW:   CBC  Recent Labs Lab 03/21/17 0510  WBC 2.9*  HGB 8.2*  HCT 23.9*  PLT 182    Chemistries   Recent Labs Lab 03/18/17 0321  03/21/17  0510  NA 125*  126*  < > 126*  K 3.7  < > 4.0  CL 95*  < > 92*  CO2 23  < > 26  GLUCOSE 76  < > 90  BUN 25*  < > 34*  CREATININE 1.58*  < > 1.88*  CALCIUM 8.3*  < > 8.6*  AST 16  --   --   ALT 11*  --   --   ALKPHOS 54  --   --   BILITOT 0.5  --   --   < > = values in this interval not displayed.    Contact information for follow-up providers    Marisue Ivan, MD. Schedule an appointment as soon as possible for a visit in 1 week(s).   Specialty:  Family Medicine Contact information: [address] [facility] [place] [address] [phone]        Lamont Dowdy, MD. Schedule an appointment as soon as possible for a visit on 03/29/2017.   Specialty:  Internal Medicine Why:  @ 9:30 am Contact information: [address] [place] [address] [phone]            Contact information for after-discharge care    Destination    [facility] Follow up.   Specialty:  Skilled Nursing Facility Contact information: [address] [place] [address] [phone]                  Management plans discussed with the patient, family and they are in agreement.  CODE STATUS: DNR   TOTAL TIME TAKING CARE OF THIS PATIENT: 45 minutes.    Delfino Lovett M.D on 03/21/2017 at 10:17 AM  Between 7am to 6pm - Pager - [phone]  After 6pm go to [email or web address] - Social research officer, government  [facility] [place] [facility]  Office  [phone]  CC: Primary care physician; Marisue Ivan, MD   Note: This dictation was prepared with Dragon dictation along with smaller phrase technology. Any transcriptional errors that result from this process are unintentional.

## 2017-03-21 NOTE — Progress Notes (Signed)
Patient is medically stable for D/C to [facility] today. Per Gavin Pound admissions coordinator at [facility] patient can come today to room 116-A. RN will call report to [facility] and arrange EMS for transport. Clinical Child psychotherapist (CSW) sent D/C orders to [employer] via [employer]. Patient is aware of above. CSW contacted patient's daughter Lucindy and made her aware of above. Please reconsult if future social work needs arise. CSW signing off.   [employer], LCSW [phone]

## 2017-03-21 NOTE — Clinical Social Work Placement (Signed)
   CLINICAL SOCIAL WORK PLACEMENT  NOTE  Date:  03/21/2017  Patient Details  Name: Laura [name] MRN: [id number] Date of Birth: 1924/11/13  Clinical Social Work is seeking post-discharge placement for this patient at the Skilled  Nursing Facility level of care (*CSW will initial, date and re-position this form in  chart as items are completed):  Yes   Patient/family provided with [facility] Clinical Social Work Department's list of facilities offering this level of care within the geographic area requested by the patient (or if unable, by the patient's family).  Yes   Patient/family informed of their freedom to choose among providers that offer the needed level of care, that participate in Medicare, Medicaid or managed care program needed by the patient, have an available bed and are willing to accept the patient.  Yes   Patient/family informed of [facility]'s ownership interest in [facility] and [facility], as well as of the fact that they are under no obligation to receive care at these facilities.  PASRR submitted to EDS on       PASRR number received on       Existing PASRR number confirmed on 03/19/17     FL2 transmitted to all facilities in geographic area requested by pt/family on 03/19/17     FL2 transmitted to all facilities within larger geographic area on       Patient informed that his/her managed care company has contracts with or will negotiate with certain facilities, including the following:        Yes   Patient/family informed of bed offers received.  Patient chooses bed at  [facility] )     Physician recommends and patient chooses bed at      Patient to be transferred to  [facility] ) on 03/21/17.  Patient to be transferred to facility by  [facility] )     Patient family notified on 03/21/17 of transfer.  Name of family member notified:   (Patient's daughter Laura [name] is aware of D/C today. )     PHYSICIAN        Additional Comment:    _______________________________________________ Laura [name], Laura Crocker, LCSW 03/21/2017, 11:01 AM

## 2017-03-21 NOTE — Care Management Important Message (Signed)
Important Message  Patient Details  Name: NYELLE WOLFSON MRN: [id number] Date of Birth: 08/10/1924   Medicare Important Message Given:  Yes    [name], RN 03/21/2017, 10:54 AM

## 2017-03-21 NOTE — Progress Notes (Signed)
Patient is alert and oriented and able to verbalize needs. No complaints of pain at this time. PIV removed. Vital signs stable. Discharge instructions gone over with patient at this time. Patient has no concerns at this time.  Patient will be transported from [facility] via EMS to [facility]. Called in report to [place].

## 2017-03-22 ENCOUNTER — Ambulatory Visit: Payer: Self-pay | Admitting: *Deleted

## 2017-03-23 DIAGNOSIS — E871 Hypo-osmolality and hyponatremia: Secondary | ICD-10-CM | POA: Diagnosis not present

## 2017-03-23 DIAGNOSIS — M25552 Pain in left hip: Secondary | ICD-10-CM | POA: Diagnosis not present

## 2017-03-23 DIAGNOSIS — M6281 Muscle weakness (generalized): Secondary | ICD-10-CM | POA: Diagnosis not present

## 2017-03-23 DIAGNOSIS — I5032 Chronic diastolic (congestive) heart failure: Secondary | ICD-10-CM | POA: Diagnosis not present

## 2017-03-29 ENCOUNTER — Ambulatory Visit: Payer: Medicare Other | Admitting: Family

## 2017-03-30 DIAGNOSIS — I5032 Chronic diastolic (congestive) heart failure: Secondary | ICD-10-CM | POA: Diagnosis not present

## 2017-03-30 DIAGNOSIS — I10 Essential (primary) hypertension: Secondary | ICD-10-CM | POA: Diagnosis not present

## 2017-03-31 ENCOUNTER — Ambulatory Visit: Payer: Medicare Other | Admitting: *Deleted

## 2017-04-04 DIAGNOSIS — J3089 Other allergic rhinitis: Secondary | ICD-10-CM | POA: Diagnosis not present

## 2017-04-04 DIAGNOSIS — R600 Localized edema: Secondary | ICD-10-CM | POA: Diagnosis not present

## 2017-04-04 DIAGNOSIS — R1084 Generalized abdominal pain: Secondary | ICD-10-CM | POA: Diagnosis not present

## 2017-04-04 DIAGNOSIS — I480 Paroxysmal atrial fibrillation: Secondary | ICD-10-CM | POA: Diagnosis not present

## 2017-04-06 ENCOUNTER — Ambulatory Visit: Payer: Medicare Other | Admitting: Family

## 2017-04-06 ENCOUNTER — Other Ambulatory Visit
Admission: RE | Admit: 2017-04-06 | Discharge: 2017-04-06 | Disposition: A | Discharge status: Home / Self Care | Payer: Medicare Other | Source: Ambulatory Visit | Attending: Emergency Medicine | Admitting: Emergency Medicine

## 2017-04-06 DIAGNOSIS — I10 Essential (primary) hypertension: Secondary | ICD-10-CM | POA: Diagnosis not present

## 2017-04-06 DIAGNOSIS — I4891 Unspecified atrial fibrillation: Secondary | ICD-10-CM | POA: Insufficient documentation

## 2017-04-06 DIAGNOSIS — I480 Paroxysmal atrial fibrillation: Secondary | ICD-10-CM | POA: Diagnosis not present

## 2017-04-06 DIAGNOSIS — I5032 Chronic diastolic (congestive) heart failure: Secondary | ICD-10-CM | POA: Diagnosis not present

## 2017-04-06 DIAGNOSIS — D508 Other iron deficiency anemias: Secondary | ICD-10-CM | POA: Diagnosis not present

## 2017-04-06 LAB — CBC WITH DIFFERENTIAL/PLATELET
Basophils Absolute: 0 10*3/uL (ref 0–0.1)
Basophils Relative: 1 %
Eosinophils Absolute: 0.2 10*3/uL (ref 0–0.7)
Eosinophils Relative: 4 %
HCT: 26.6 % — ABNORMAL LOW (ref 35.0–47.0)
Hemoglobin: 9 g/dL — ABNORMAL LOW (ref 12.0–16.0)
LYMPHS ABS: 0.4 10*3/uL — AB (ref 1.0–3.6)
LYMPHS PCT: 10 %
MCH: 30.8 pg (ref 26.0–34.0)
MCHC: 34 g/dL (ref 32.0–36.0)
MCV: 90.6 fL (ref 80.0–100.0)
MONO ABS: 0.4 10*3/uL (ref 0.2–0.9)
MONOS PCT: 8 %
Neutro Abs: 3.3 10*3/uL (ref 1.4–6.5)
Neutrophils Relative %: 77 %
PLATELETS: 193 10*3/uL (ref 150–440)
RBC: 2.93 MIL/uL — ABNORMAL LOW (ref 3.80–5.20)
RDW: 14.8 % — AB (ref 11.5–14.5)
WBC: 4.3 10*3/uL (ref 3.6–11.0)

## 2017-04-07 ENCOUNTER — Telehealth: Payer: Self-pay | Admitting: Family

## 2017-04-07 NOTE — Telephone Encounter (Signed)
Patient did not show for her [facility] appointment on 04/06/17. Will attempt to reschedule.

## 2017-04-08 ENCOUNTER — Emergency Department: Payer: Medicare Other

## 2017-04-08 ENCOUNTER — Inpatient Hospital Stay
Admission: EM | Admit: 2017-04-08 | Discharge: 2017-04-10 | DRG: 291 | Disposition: A | Discharge status: Home Health | Payer: Medicare Other | Attending: Internal Medicine | Admitting: Internal Medicine

## 2017-04-08 ENCOUNTER — Encounter: Payer: Self-pay | Admitting: Emergency Medicine

## 2017-04-08 DIAGNOSIS — Z882 Allergy status to sulfonamides status: Secondary | ICD-10-CM | POA: Diagnosis not present

## 2017-04-08 DIAGNOSIS — Z888 Allergy status to other drugs, medicaments and biological substances status: Secondary | ICD-10-CM

## 2017-04-08 DIAGNOSIS — Z96653 Presence of artificial knee joint, bilateral: Secondary | ICD-10-CM | POA: Diagnosis present

## 2017-04-08 DIAGNOSIS — I248 Other forms of acute ischemic heart disease: Secondary | ICD-10-CM | POA: Diagnosis present

## 2017-04-08 DIAGNOSIS — Z880 Allergy status to penicillin: Secondary | ICD-10-CM

## 2017-04-08 DIAGNOSIS — J9601 Acute respiratory failure with hypoxia: Secondary | ICD-10-CM | POA: Diagnosis present

## 2017-04-08 DIAGNOSIS — Z66 Do not resuscitate: Secondary | ICD-10-CM | POA: Diagnosis present

## 2017-04-08 DIAGNOSIS — K219 Gastro-esophageal reflux disease without esophagitis: Secondary | ICD-10-CM | POA: Diagnosis present

## 2017-04-08 DIAGNOSIS — J189 Pneumonia, unspecified organism: Secondary | ICD-10-CM | POA: Diagnosis present

## 2017-04-08 DIAGNOSIS — I4891 Unspecified atrial fibrillation: Secondary | ICD-10-CM | POA: Diagnosis present

## 2017-04-08 DIAGNOSIS — I5033 Acute on chronic diastolic (congestive) heart failure: Secondary | ICD-10-CM | POA: Diagnosis present

## 2017-04-08 DIAGNOSIS — N184 Chronic kidney disease, stage 4 (severe): Secondary | ICD-10-CM | POA: Diagnosis present

## 2017-04-08 DIAGNOSIS — I509 Heart failure, unspecified: Secondary | ICD-10-CM

## 2017-04-08 DIAGNOSIS — E876 Hypokalemia: Secondary | ICD-10-CM | POA: Diagnosis present

## 2017-04-08 DIAGNOSIS — Z9111 Patient's noncompliance with dietary regimen: Secondary | ICD-10-CM

## 2017-04-08 DIAGNOSIS — R7989 Other specified abnormal findings of blood chemistry: Secondary | ICD-10-CM | POA: Diagnosis not present

## 2017-04-08 DIAGNOSIS — H3530 Unspecified macular degeneration: Secondary | ICD-10-CM | POA: Diagnosis present

## 2017-04-08 DIAGNOSIS — Z79899 Other long term (current) drug therapy: Secondary | ICD-10-CM | POA: Diagnosis not present

## 2017-04-08 DIAGNOSIS — Z8679 Personal history of other diseases of the circulatory system: Secondary | ICD-10-CM | POA: Diagnosis not present

## 2017-04-08 DIAGNOSIS — E785 Hyperlipidemia, unspecified: Secondary | ICD-10-CM | POA: Diagnosis not present

## 2017-04-08 DIAGNOSIS — Z7982 Long term (current) use of aspirin: Secondary | ICD-10-CM

## 2017-04-08 DIAGNOSIS — I110 Hypertensive heart disease with heart failure: Secondary | ICD-10-CM | POA: Diagnosis not present

## 2017-04-08 DIAGNOSIS — I130 Hypertensive heart and chronic kidney disease with heart failure and stage 1 through stage 4 chronic kidney disease, or unspecified chronic kidney disease: Principal | ICD-10-CM | POA: Diagnosis present

## 2017-04-08 DIAGNOSIS — Z881 Allergy status to other antibiotic agents status: Secondary | ICD-10-CM | POA: Diagnosis not present

## 2017-04-08 DIAGNOSIS — J45909 Unspecified asthma, uncomplicated: Secondary | ICD-10-CM | POA: Diagnosis present

## 2017-04-08 DIAGNOSIS — Z885 Allergy status to narcotic agent status: Secondary | ICD-10-CM | POA: Diagnosis not present

## 2017-04-08 DIAGNOSIS — I10 Essential (primary) hypertension: Secondary | ICD-10-CM | POA: Diagnosis not present

## 2017-04-08 DIAGNOSIS — I5023 Acute on chronic systolic (congestive) heart failure: Secondary | ICD-10-CM | POA: Diagnosis not present

## 2017-04-08 DIAGNOSIS — E782 Mixed hyperlipidemia: Secondary | ICD-10-CM | POA: Diagnosis present

## 2017-04-08 DIAGNOSIS — I480 Paroxysmal atrial fibrillation: Secondary | ICD-10-CM | POA: Diagnosis not present

## 2017-04-08 DIAGNOSIS — N189 Chronic kidney disease, unspecified: Secondary | ICD-10-CM | POA: Diagnosis not present

## 2017-04-08 HISTORY — DX: Unspecified atrial fibrillation: I48.91

## 2017-04-08 LAB — BASIC METABOLIC PANEL
Anion gap: 11 (ref 5–15)
BUN: 47 mg/dL — AB (ref 6–20)
CHLORIDE: 95 mmol/L — AB (ref 101–111)
CO2: 26 mmol/L (ref 22–32)
Calcium: 9.2 mg/dL (ref 8.9–10.3)
Creatinine, Ser: 1.91 mg/dL — ABNORMAL HIGH (ref 0.44–1.00)
GFR calc Af Amer: 26 mL/min — ABNORMAL LOW (ref >60)
GFR calc non Af Amer: 22 mL/min — ABNORMAL LOW (ref >60)
GLUCOSE: 106 mg/dL — AB (ref 65–99)
POTASSIUM: 4.2 mmol/L (ref 3.5–5.1)
Sodium: 132 mmol/L — ABNORMAL LOW (ref 135–145)

## 2017-04-08 LAB — BRAIN NATRIURETIC PEPTIDE: B Natriuretic Peptide: 2049 pg/mL — ABNORMAL HIGH (ref 0.0–100.0)

## 2017-04-08 LAB — CBC
HEMATOCRIT: 31.9 % — AB (ref 35.0–47.0)
Hemoglobin: 10.6 g/dL — ABNORMAL LOW (ref 12.0–16.0)
MCH: 30.7 pg (ref 26.0–34.0)
MCHC: 33.3 g/dL (ref 32.0–36.0)
MCV: 92.1 fL (ref 80.0–100.0)
Platelets: 233 10*3/uL (ref 150–440)
RBC: 3.47 MIL/uL — ABNORMAL LOW (ref 3.80–5.20)
RDW: 14.5 % (ref 11.5–14.5)
WBC: 4.7 10*3/uL (ref 3.6–11.0)

## 2017-04-08 LAB — MRSA PCR SCREENING: MRSA by PCR: NEGATIVE

## 2017-04-08 LAB — TROPONIN I: Troponin I: 0.03 ng/mL (ref <0.03)

## 2017-04-08 MED ORDER — ACETAMINOPHEN 325 MG PO TABS: 650.0000 mg | ORAL_TABLET | ORAL | Status: DC | PRN

## 2017-04-08 MED ORDER — DEXTROSE 5 % IV SOLN
5.0000 mg/h | Freq: Once | INTRAVENOUS | Status: AC
  Administered 2017-04-08: 10 mg/h via INTRAVENOUS
  Filled 2017-04-08: qty 100

## 2017-04-08 MED ORDER — METOPROLOL TARTRATE 5 MG/5ML IV SOLN
INTRAVENOUS | Status: AC
  Filled 2017-04-08: qty 5

## 2017-04-08 MED ORDER — FERROUS FUMARATE 86 (27 FE) MG PO CAPS
1.0000 | ORAL_CAPSULE | Freq: Two times a day (BID) | ORAL | Status: DC
  Filled 2017-04-08: qty 1

## 2017-04-08 MED ORDER — FUROSEMIDE 10 MG/ML IJ SOLN
40.0000 mg | Freq: Once | INTRAMUSCULAR | Status: AC
  Administered 2017-04-08: 40 mg via INTRAVENOUS
  Filled 2017-04-08: qty 4

## 2017-04-08 MED ORDER — ONDANSETRON HCL 4 MG/2ML IJ SOLN: 4.0000 mg | Freq: Four times a day (QID) | INTRAMUSCULAR | Status: DC | PRN

## 2017-04-08 MED ORDER — FUROSEMIDE 10 MG/ML IJ SOLN
40.0000 mg | Freq: Two times a day (BID) | INTRAMUSCULAR | Status: DC
  Administered 2017-04-08 – 2017-04-10 (×4): 40 mg via INTRAVENOUS
  Filled 2017-04-08 (×4): qty 4

## 2017-04-08 MED ORDER — FERROUS GLUCONATE 240 (27 FE) MG PO TABS
240.0000 mg | ORAL_TABLET | Freq: Two times a day (BID) | ORAL | Status: DC
  Administered 2017-04-08 – 2017-04-09 (×2): 240 mg via ORAL
  Filled 2017-04-08 (×5): qty 1

## 2017-04-08 MED ORDER — HYDRALAZINE HCL 50 MG PO TABS
100.0000 mg | ORAL_TABLET | Freq: Two times a day (BID) | ORAL | Status: DC
  Administered 2017-04-08 – 2017-04-10 (×4): 100 mg via ORAL
  Filled 2017-04-08 (×4): qty 2

## 2017-04-08 MED ORDER — SODIUM CHLORIDE 0.9 % IV SOLN: 250.0000 mL | INTRAVENOUS | Status: DC | PRN

## 2017-04-08 MED ORDER — SIMVASTATIN 40 MG PO TABS
40.0000 mg | ORAL_TABLET | Freq: Every day | ORAL | Status: DC
  Administered 2017-04-08 – 2017-04-09 (×2): 40 mg via ORAL
  Filled 2017-04-08 (×2): qty 1

## 2017-04-08 MED ORDER — SODIUM CHLORIDE 0.9% FLUSH
3.0000 mL | INTRAVENOUS | Status: DC | PRN
  Administered 2017-04-08 – 2017-04-09 (×2): 3 mL via INTRAVENOUS
  Filled 2017-04-08 (×2): qty 3

## 2017-04-08 MED ORDER — ISOSORBIDE MONONITRATE ER 60 MG PO TB24
120.0000 mg | ORAL_TABLET | Freq: Every day | ORAL | Status: DC
  Administered 2017-04-09 – 2017-04-10 (×2): 120 mg via ORAL
  Filled 2017-04-08 (×2): qty 2

## 2017-04-08 MED ORDER — LISINOPRIL 10 MG PO TABS
10.0000 mg | ORAL_TABLET | Freq: Every day | ORAL | Status: DC
  Administered 2017-04-09 – 2017-04-10 (×2): 10 mg via ORAL
  Filled 2017-04-08 (×2): qty 1

## 2017-04-08 MED ORDER — DILTIAZEM HCL ER COATED BEADS 240 MG PO CP24
240.0000 mg | ORAL_CAPSULE | Freq: Every day | ORAL | Status: DC
  Administered 2017-04-09 – 2017-04-10 (×2): 240 mg via ORAL
  Filled 2017-04-08 (×2): qty 1

## 2017-04-08 MED ORDER — OCUVITE-LUTEIN PO CAPS
1.0000 | ORAL_CAPSULE | Freq: Every day | ORAL | Status: DC
  Administered 2017-04-09 – 2017-04-10 (×2): 1 via ORAL
  Filled 2017-04-08 (×2): qty 1

## 2017-04-08 MED ORDER — PANTOPRAZOLE SODIUM 40 MG PO TBEC
40.0000 mg | DELAYED_RELEASE_TABLET | Freq: Every day | ORAL | Status: DC
Start: 2017-04-09 — End: 2017-04-10
  Administered 2017-04-09 – 2017-04-10 (×2): 40 mg via ORAL
  Filled 2017-04-08 (×2): qty 1

## 2017-04-08 MED ORDER — FLUTICASONE PROPIONATE 50 MCG/ACT NA SUSP
2.0000 | Freq: Every day | NASAL | Status: DC
  Administered 2017-04-09 – 2017-04-10 (×2): 2 via NASAL
  Filled 2017-04-08: qty 16

## 2017-04-08 MED ORDER — SODIUM CHLORIDE 0.9% FLUSH
3.0000 mL | Freq: Two times a day (BID) | INTRAVENOUS | Status: DC
  Administered 2017-04-08: 3 mL via INTRAVENOUS

## 2017-04-08 MED ORDER — ENOXAPARIN SODIUM 30 MG/0.3ML ~~LOC~~ SOLN
30.0000 mg | SUBCUTANEOUS | Status: DC
  Administered 2017-04-08 – 2017-04-09 (×2): 30 mg via SUBCUTANEOUS
  Filled 2017-04-08 (×2): qty 0.3

## 2017-04-08 MED ORDER — CARVEDILOL 25 MG PO TABS
25.0000 mg | ORAL_TABLET | Freq: Two times a day (BID) | ORAL | Status: DC
  Administered 2017-04-08 – 2017-04-09 (×3): 25 mg via ORAL
  Filled 2017-04-08 (×4): qty 1

## 2017-04-08 MED ORDER — TRAMADOL HCL 50 MG PO TABS: 50.0000 mg | ORAL_TABLET | Freq: Four times a day (QID) | ORAL | Status: DC | PRN

## 2017-04-08 MED ORDER — METOLAZONE 2.5 MG PO TABS
2.5000 mg | ORAL_TABLET | Freq: Every day | ORAL | Status: DC
  Administered 2017-04-08 – 2017-04-10 (×3): 2.5 mg via ORAL
  Filled 2017-04-08 (×3): qty 1

## 2017-04-08 MED ORDER — FUROSEMIDE 10 MG/ML IJ SOLN: 60.0000 mg | Freq: Once | INTRAMUSCULAR | Status: DC

## 2017-04-08 MED ORDER — METOPROLOL TARTRATE 5 MG/5ML IV SOLN: 5.0000 mg | Freq: Once | INTRAVENOUS | Status: DC

## 2017-04-08 MED ORDER — ACETAMINOPHEN 500 MG PO TABS
1000.0000 mg | ORAL_TABLET | Freq: Every day | ORAL | Status: DC
  Administered 2017-04-09 – 2017-04-10 (×2): 1000 mg via ORAL
  Filled 2017-04-08 (×2): qty 2

## 2017-04-08 MED ORDER — ASPIRIN EC 81 MG PO TBEC
81.0000 mg | DELAYED_RELEASE_TABLET | Freq: Every day | ORAL | Status: DC
Start: 2017-04-09 — End: 2017-04-10
  Administered 2017-04-09 – 2017-04-10 (×2): 81 mg via ORAL
  Filled 2017-04-08 (×2): qty 1

## 2017-04-08 MED ORDER — METOPROLOL TARTRATE 5 MG/5ML IV SOLN: 5.0000 mg | INTRAVENOUS | Status: DC | PRN

## 2017-04-08 MED ORDER — LUTEIN 20 MG PO TABS: 1.0000 | ORAL_TABLET | Freq: Every day | ORAL | Status: DC

## 2017-04-08 NOTE — ED Notes (Signed)
Pt ambulated to bathroom with assistance.

## 2017-04-08 NOTE — H&P (Signed)
[facility] [place] at [facility]   PATIENT NAME: Laura [name]    MR#:  [id number]  DATE OF BIRTH:  1924/10/21  DATE OF ADMISSION:  04/08/2017  PRIMARY CARE PHYSICIAN: Marisue Ivan, MD   REQUESTING/REFERRING PHYSICIAN: Nita Sickle, MD  CHIEF COMPLAINT:   SOB  HISTORY OF PRESENT ILLNESS:  Laura [name]  is a 83 y.o. female with a known history of Atrial fibrillation, congestive heart failure sees Dr. Juliann Pares as an outpatient, hypertension, chronic kidney disease and multiple other medical problems as presented to the ED with a chief complaint of shortness of breath. Patient was just discharged from the rehabilitation center yesterday May 18  after being admitted for CHF exacerbation. Patient has been progressively getting short of breath for the past 3 days and worsening of lower extremity swelling. Patient was unable to sleep last night because of her shortness of breath and orthopnea. Gained approximately 5 pounds the past 3-4 days. Patient was taking metolazone 2.5 mg which was discontinued on May 18. Patient was in atrial fibrillation with RVR with heart rate 1:30 at the time of arrival. Cardizem bolus was given in the ED and her heart rate was at around 100-110 during my examination. Patient denies any palpitations. Her daughter and daughter-in-law are at bedside   PAST MEDICAL HISTORY:   Past Medical History:  Diagnosis Date  . Anemia   . Arrhythmia    atrial fibrillation  . Atrial fibrillation (HCC)   . CHF (congestive heart failure) (HCC)   . Chronic kidney disease   . GERD (gastroesophageal reflux disease)   . GI bleed   . High cholesterol   . Hypertension   . Hyponatremia   . Macular degeneration   . SVT (supraventricular tachycardia) (HCC)     PAST SURGICAL HISTOIRY:   Past Surgical History:  Procedure Laterality Date  . ABDOMINAL HYSTERECTOMY    . JOINT REPLACEMENT    . REPLACEMENT TOTAL KNEE BILATERAL    . VAGINAL  HYSTERECTOMY     Partial    SOCIAL HISTORY:   Social History  Substance Use Topics  . Smoking status: Never Smoker  . Smokeless tobacco: Never Used  . Alcohol use No    FAMILY HISTORY:   Family History  Problem Relation Age of Onset  . Hypertension Mother   . CAD Father   . Heart disease Father   . Arthritis Father   . Pancreatic cancer Son   . Heart failure Sister   . Diabetes Brother   . Heart failure Sister   . Diabetes Son     DRUG ALLERGIES:   Allergies  Allergen Reactions  . Penicillins Shortness Of Breath        . Codeine Itching  . Amlodipine Rash    Peripheral edema  . Ciprofloxacin Rash  . Sulfa Antibiotics Rash    REVIEW OF SYSTEMS:  CONSTITUTIONAL: No fever, fatigue or weakness.  EYES: No blurred or double vision.  EARS, NOSE, AND THROAT: No tinnitus or ear pain.  RESPIRATORY: No cough, reports shortness of breath, and wt gain.  CARDIOVASCULAR: No chest pain,  Has orthopnea, edema.  GASTROINTESTINAL: No nausea, vomiting, diarrhea or abdominal pain.  GENITOURINARY: No dysuria, hematuria.  ENDOCRINE: No polyuria, nocturia,  HEMATOLOGY: No anemia, easy bruising or bleeding SKIN: No rash or lesion. MUSCULOSKELETAL: No joint pain or arthritis.  Reporting lower extremity swelling NEUROLOGIC: No tingling, numbness, weakness.  PSYCHIATRY: No anxiety or depression.   MEDICATIONS AT HOME:   Prior  to Admission medications   Medication Sig Start Date End Date Taking? Authorizing Provider  acetaminophen (TYLENOL) 500 MG tablet Take 1,000 mg by mouth daily. Take 2 tabs (1000mg ) by mouth every morning.  Take 2 tablets in evening if needed.   Yes [provider]  aspirin EC 81 MG tablet Take 81 mg by mouth daily.   Yes [provider]  carvedilol (COREG) 25 MG tablet Take 1 tablet (25 mg total) by mouth 2 (two) times daily with a meal. 02/27/17  Yes Mody, Sital, MD  diltiazem (CARDIZEM CD) 240 MG 24 hr capsule Take 1 capsule (240 mg total)  by mouth daily. 02/28/17  Yes Mody, Patricia PesaSital, MD  docusate sodium (COLACE) 100 MG capsule Take 100 mg by mouth 2 (two) times daily. 01/02/17  Yes [provider]  Ferrous Gluconate 324 (37.5 Fe) MG TABS Take 1 tablet by mouth 2 (two) times daily. 11/15/16  Yes [provider]  fluticasone (FLONASE) 50 MCG/ACT nasal spray Place 2 sprays into both nostrils daily.   Yes [provider]  hydrALAZINE (APRESOLINE) 100 MG tablet Take 100 mg by mouth 2 (two) times daily. 12/23/16  Yes [provider]  isosorbide mononitrate (IMDUR) 120 MG 24 hr tablet Take 1 tablet by mouth daily. 12/23/16  Yes [provider]  lisinopril (PRINIVIL,ZESTRIL) 10 MG tablet Take 10 mg by mouth daily. 11/15/16  Yes [provider]  Lutein 20 MG TABS Take 1 tablet by mouth daily.   Yes [provider]  Multiple Vitamins-Minerals (PRESERVISION AREDS PO) Take 1 tablet by mouth daily.    Yes [provider]  pantoprazole (PROTONIX) 40 MG tablet Take 40 mg by mouth daily. 12/23/16  Yes [provider]  simvastatin (ZOCOR) 40 MG tablet Take 40 mg by mouth daily at 6 PM.  12/23/16  Yes [provider]  torsemide (DEMADEX) 20 MG tablet Take 20 mg by mouth daily.    Yes [provider]  traMADol (ULTRAM) 50 MG tablet Take 50 mg by mouth every 4 (four) hours as needed.   Yes [provider]      VITAL SIGNS:  Blood pressure 137/84, pulse 60, temperature 97.7 F (36.5 C), temperature source Oral, resp. rate 19, height 4\' 9"  (1.448 m), weight 59 kg (130 lb), SpO2 95 %.  PHYSICAL EXAMINATION:  GENERAL:  83 y.o.-year-old patient lying in the bed with no acute distress.  EYES: Pupils equal, round, reactive to light and accommodation. No scleral icterus. Extraocular muscles intact.  HEENT: Head atraumatic, normocephalic. Oropharynx and nasopharynx clear.  NECK:  Supple, no jugular venous distention. No thyroid enlargement, no tenderness.   LUNGS: Diminished  breath sounds bilaterally, with rales and rhonchi, no wheezing. No use of accessory muscles of respiration.  CARDIOVASCULAR: S1, S2 normal. No murmurs, rubs, or gallops.  ABDOMEN: Soft, nontender, nondistended. Bowel sounds present. No organomegaly or mass.  EXTREMITIES:2 + pedal edema,  No cyanosis, or clubbing.  NEUROLOGIC: Cranial nerves II through XII are intact. Muscle strength 5/5 in all extremities. Sensation intact. Gait not checked.  PSYCHIATRIC: The patient is alert and oriented x 3.  SKIN: No obvious rash, lesion, or ulcer.   LABORATORY PANEL:   CBC  Recent Labs Lab 04/08/17 1234  WBC 4.7  HGB 10.6*  HCT 31.9*  PLT 233   ------------------------------------------------------------------------------------------------------------------  Chemistries   Recent Labs Lab 04/08/17 1234  NA 132*  K 4.2  CL 95*  CO2 26  GLUCOSE 106*  BUN 47*  CREATININE 1.91*  CALCIUM 9.2   ------------------------------------------------------------------------------------------------------------------  Cardiac Enzymes  Recent Labs Lab 04/08/17 1234  TROPONINI 0.03*   ------------------------------------------------------------------------------------------------------------------  RADIOLOGY:  Dg Chest Portable 1 View  Result Date: 04/08/2017 CLINICAL DATA:  Was recently admitted for CHF then went to rehab. Has been having dyspnea and was in afib rvr at [facility]. accessory muscle use, tachypnea and labored int triage. Has had swelling to legs. EXAM: PORTABLE CHEST 1 VIEW COMPARISON:  03/17/2017 FINDINGS: Cardiac silhouette is mildly enlarged, stable from prior exam. No mediastinal or hilar masses. There are thickened bronchovascular markings bilaterally. Thickening is noted along the minor fissure. There is evidence of small pleural effusions. No evidence of pneumonia. No pneumothorax. Bony thorax is demineralized but grossly intact. Findings are stable when compared  to the prior study. IMPRESSION: 1. No convincing acute cardiopulmonary disease. No overt pulmonary edema or evidence of pneumonia. 2. Cardiomegaly with small pleural effusions similar to the prior chest radiograph. Electronically Signed   By: Amie Portland M.D.   On: 04/08/2017 13:02    EKG:   Orders placed or performed during the hospital encounter of 04/08/17  . EKG 12-Lead  . EKG 12-Lead  . ED EKG within 10 minutes  . ED EKG within 10 minutes    IMPRESSION AND PLAN:   Laura [name]  is a 83 y.o. female with a known history of Atrial fibrillation, congestive heart failure sees Dr. Juliann Pares as an outpatient, hypertension, chronic kidney disease and multiple other medical problems as presented to the ED with a chief complaint of shortness of breath. Patient was just discharged from the rehabilitation center yesterday May 18  after being admitted for CHF exacerbation. Patient has been progressively getting short of breath for the past 3 days and worsening of lower extremity swelling.  #Acute respiratory failure secondary to CHF exacerbation /atrial fibrillation with RVR Admit to telemetry Diuresis with Lasix 40 mg IV every 12 hours and metolazone 2.5 mg is received it was discontinued on May 18 Daily weights, intake and output Cardiology consult is placed to Dr. Juliann Pares Monitor renal function closely Continue home medication Imdur, ACE inhibitor, Coreg, statin Patient recently had echocardiogram in April 2018  #Atrial fibrillation with RVR Monitor patient on telemetry and continue aspirin 81 mg enteric-coated and Coreg 25 mg by mouth twice a day continue Cardizem and provided Lopressor boluses as needed.  #Essential hypertension continue her home medications Coreg, Cardizem CD, lisinopril, hydralazine and titrate as needed  #Hyperlipidemia continue Zocor  GI prophylaxis with Protonix. DVT prophylaxis with Lovenox subcutaneous  All the records are reviewed and case discussed with ED  provider. Management plans discussed with the patient, family and they are in agreement.  CODE STATUS: DNR,  Daughter cindy Denver Faster is HCPOA  TOTAL TIME TAKING CARE OF THIS PATIENT: 45  minutes.   Note: This dictation was prepared with Dragon dictation along with smaller phrase technology. Any transcriptional errors that result from this process are unintentional.  Ramonita Lab M.D on 04/08/2017 at 3:40 PM  Between 7am to 6pm - Pager - [phone]  After 6pm go to [email or web address] - password [email or web address] [facility]  [place] [facility]  Office  [phone]  CC: Primary care physician; Marisue Ivan, MD

## 2017-04-08 NOTE — ED Provider Notes (Signed)
[facility] Emergency Department Provider Note  ____________________________________________  Time seen: Approximately 1:22 PM  I have reviewed the triage vital signs and the nursing notes.   HISTORY  Chief Complaint Atrial Fibrillation and Shortness of Breath   HPI Laura [name] is a 83 y.o. female with a history of atrial fibrillation, CHF, hypertension, hyponatremia who presents for evaluation of shortness of breath. Patient was recently discharged to a rehabilitation facility 2 weeks ago after being admitted for CHF exacerbation. She went home yesterday. She has had progressively worsening shortness of breath over the course of the last 3 days and bilateral lower extremity edema. She reports that she was unable to sleep last night due to significant shortness of breath and orthopnea. She believes that she gained 3-5 pounds over the course of the last few days. She has been taking her medications. This morning she also noted that her heart rate was elevated. She has had a cough that is dry for a week but no fever or chills. She denies chest pain. She reports that her shortness of breath is worse with exertion and laying flat. She currently endorses moderate severe shortness of breath.  Past Medical History:  Diagnosis Date  . Anemia   . Arrhythmia    atrial fibrillation  . Atrial fibrillation (HCC)   . CHF (congestive heart failure) (HCC)   . Chronic kidney disease   . GERD (gastroesophageal reflux disease)   . GI bleed   . High cholesterol   . Hypertension   . Hyponatremia   . Macular degeneration   . SVT (supraventricular tachycardia) [facility])     Patient Active Problem List   Diagnosis Date Noted  . Atrial fibrillation (HCC) 03/04/2017  . HTN (hypertension) 03/04/2017  . Hyponatremia 03/04/2017  . Acute CHF (HCC) 02/19/2017  . CHF (congestive heart failure) (HCC) 02/19/2017    Past Surgical History:  Procedure Laterality Date  . ABDOMINAL  HYSTERECTOMY    . JOINT REPLACEMENT    . REPLACEMENT TOTAL KNEE BILATERAL    . VAGINAL HYSTERECTOMY     Partial    Prior to Admission medications   Medication Sig Start Date End Date Taking? Authorizing Provider  acetaminophen (TYLENOL) 500 MG tablet Take 1,000 mg by mouth daily. Take 2 tabs (1000mg ) by mouth every morning.  Take 2 tablets in evening if needed.   Yes [provider]  aspirin EC 81 MG tablet Take 81 mg by mouth daily.   Yes [provider]  carvedilol (COREG) 25 MG tablet Take 1 tablet (25 mg total) by mouth 2 (two) times daily with a meal. 02/27/17  Yes Mody, Sital, MD  diltiazem (CARDIZEM CD) 240 MG 24 hr capsule Take 1 capsule (240 mg total) by mouth daily. 02/28/17  Yes Mody, Patricia Pesa, MD  docusate sodium (COLACE) 100 MG capsule Take 100 mg by mouth 2 (two) times daily. 01/02/17  Yes [provider]  Ferrous Gluconate 324 (37.5 Fe) MG TABS Take 1 tablet by mouth 2 (two) times daily. 11/15/16  Yes [provider]  fluticasone (FLONASE) 50 MCG/ACT nasal spray Place 2 sprays into both nostrils daily.   Yes [provider]  hydrALAZINE (APRESOLINE) 100 MG tablet Take 100 mg by mouth 2 (two) times daily. 12/23/16  Yes [provider]  isosorbide mononitrate (IMDUR) 120 MG 24 hr tablet Take 1 tablet by mouth daily. 12/23/16  Yes [provider]  lisinopril (PRINIVIL,ZESTRIL) 10 MG tablet Take 10 mg by mouth daily.  11/15/16  Yes [provider]  Lutein 20 MG TABS Take 1 tablet by mouth daily.   Yes [provider]  Multiple Vitamins-Minerals (PRESERVISION AREDS PO) Take 1 tablet by mouth daily.    Yes [provider]  pantoprazole (PROTONIX) 40 MG tablet Take 40 mg by mouth daily. 12/23/16  Yes [provider]  simvastatin (ZOCOR) 40 MG tablet Take 40 mg by mouth daily at 6 PM.  12/23/16  Yes [provider]  torsemide (DEMADEX) 20 MG tablet Take 20 mg by mouth daily.    Yes [provider]  traMADol (ULTRAM) 50 MG tablet Take 50 mg by mouth every 4 (four) hours as needed.   Yes [provider]    Allergies Penicillins; Codeine; Amlodipine; Ciprofloxacin; and Sulfa antibiotics  Family History  Problem Relation Age of Onset  . Hypertension Mother   . CAD Father   . Heart disease Father   . Arthritis Father   . Pancreatic cancer Son   . Heart failure Sister   . Diabetes Brother   . Heart failure Sister   . Diabetes Son     Social History Social History  Substance Use Topics  . Smoking status: Never Smoker  . Smokeless tobacco: Never Used  . Alcohol use No    Review of Systems  Constitutional: Negative for fever. Eyes: Negative for visual changes. ENT: Negative for sore throat. Neck: No neck pain  Cardiovascular: Negative for chest pain. Respiratory: +shortness of breath. Gastrointestinal: Negative for abdominal pain, vomiting or diarrhea. Genitourinary: Negative for dysuria. Musculoskeletal: Negative for back pain. + b/l LE edema Skin: Negative for rash. Neurological: Negative for headaches, weakness or numbness. Psych: No SI or HI  ____________________________________________   PHYSICAL EXAM:  VITAL SIGNS: ED Triage Vitals  Enc Vitals Group     BP 04/08/17 1231 (!) 140/111     Pulse Rate 04/08/17 1231 (!) 142     Resp 04/08/17 1231 (!) 28     Temp 04/08/17 1231 97.7 F (36.5 C)     Temp Source 04/08/17 1231 Oral     SpO2 04/08/17 1231 97 %     Weight 04/08/17 1232 130 lb (59 kg)     Height 04/08/17 1232 4\' 9"  (1.448 m)     Head Circumference --      Peak Flow --      Pain Score 04/08/17 1230 7     Pain Loc --      Pain Edu? --      Excl. in GC? --     Constitutional: Alert and oriented. Well appearing and in no apparent distress. HEENT:      Head: Normocephalic and atraumatic.         Eyes: Conjunctivae are normal. Sclera is non-icteric.       Mouth/Throat: Mucous membranes are moist.       Neck: Supple with  no signs of meningismus. Cardiovascular: Irregularly irregular rhythm with tachycardic rate. No murmurs, gallops, or rubs. 2+ symmetrical distal pulses are present in all extremities. Elevated JVD. Respiratory: Increased work of breathing, normal sats, decreased breath sounds in bilateral bases with expiratory wheezes on the right  Gastrointestinal: Soft, non tender, and non distended with positive bowel sounds. No rebound or guarding. Musculoskeletal: 2+ pitting edema bilateral lower extremity to the knees Neurologic: Normal speech and language. Face is symmetric. Moving all extremities. No gross focal neurologic deficits are appreciated. Skin: Skin is warm, dry and intact. No rash noted. Psychiatric:  Mood and affect are normal. Speech and behavior are normal.  ____________________________________________   LABS (all labs ordered are listed, but only abnormal results are displayed)  Labs Reviewed  BASIC METABOLIC PANEL - Abnormal; Notable for the following:       Result Value   Sodium 132 (*)    Chloride 95 (*)    Glucose, Bld 106 (*)    BUN 47 (*)    Creatinine, Ser 1.91 (*)    GFR calc non Af Amer 22 (*)    GFR calc Af Amer 26 (*)    All other components within normal limits  CBC - Abnormal; Notable for the following:    RBC 3.47 (*)    Hemoglobin 10.6 (*)    HCT 31.9 (*)    All other components within normal limits  TROPONIN I - Abnormal; Notable for the following:    Troponin I 0.03 (*)    All other components within normal limits  BRAIN NATRIURETIC PEPTIDE - Abnormal; Notable for the following:    B Natriuretic Peptide 2,049.0 (*)    All other components within normal limits   ____________________________________________  EKG  ED ECG REPORT I, Nita Sickle, the attending physician, personally viewed and interpreted this ECG.  Atrial fibrillation, rate of 131, normal QRS and QTc intervals, left axis deviation, no ST elevations or depressions. Unchanged from prior  from February 2008 ____________________________________________  RADIOLOGY  CXR:   1. No convincing acute cardiopulmonary disease. No overt pulmonary edema or evidence of pneumonia. 2. Cardiomegaly with small pleural effusions similar to the prior chest radiograph. ____________________________________________   PROCEDURES  Procedure(s) performed: None Procedures Critical Care performed: yes  CRITICAL CARE Performed by: Nita Sickle  ?  Total critical care time: 35 min  Critical care time was exclusive of separately billable procedures and treating other patients.  Critical care was necessary to treat or prevent imminent or life-threatening deterioration.  Critical care was time spent personally by me on the following activities: development of treatment plan with patient and/or surrogate as well as nursing, discussions with consultants, evaluation of patient's response to treatment, examination of patient, obtaining history from patient or surrogate, ordering and performing treatments and interventions, ordering and review of laboratory studies, ordering and review of radiographic studies, pulse oximetry and re-evaluation of patient's condition.  ____________________________________________   INITIAL IMPRESSION / ASSESSMENT AND PLAN / ED COURSE  83 y.o. female with a history of atrial fibrillation, CHF, hypertension, hyponatremia who presents for evaluation of shortness of breath, orthopnea, weight gain, increased abdominal girth, and lower extremity pitting edema 3 days. Patient is in A. fib with RVR. She has decreased air movement at bilateral bases, elevated JVD, and bilateral 2+ pitting edema concerning for volume overload. Her sats are normal on room air. Her BNP is over 2000 (it was 274 on 02/19/17). Patient was given 10 mg of IV diltiazem with good rate control. Patient was given 40 mg of IV Lasix. Will admit to Hospitalist     Pertinent labs & imaging results that  were available during my care of the patient were reviewed by me and considered in my medical decision making (see chart for details).    ____________________________________________   FINAL CLINICAL IMPRESSION(S) / ED DIAGNOSES  Final diagnoses:  Acute on chronic congestive heart failure, unspecified heart failure type (HCC)  Atrial fibrillation with RVR (HCC)      NEW MEDICATIONS STARTED DURING THIS VISIT:  New Prescriptions   No medications on file  Note:  This document was prepared using Dragon voice recognition software and may include unintentional dictation errors.    Nita SickleVeronese, [place], MD 04/08/17 1341

## 2017-04-08 NOTE — Progress Notes (Signed)
Family Meeting Note  Advance Directive:yes  Today a meeting took place with the Patient, daughter and daughter - in - law in patients room   The following clinical team members were present during this meeting:MD  The following were discussed:Patient's diagnosis and plan of care discussed in detail with the patient and her daughter at bedside, Patient's progosis: Unable to determine and Goals for treatment: DNR. Dr. Lawson Radarindy Pittard is healthcare power of attorney  Additional follow-up to be provided: hospitalist and cardiology   Time spent during discussion:18 min   Laura [name], Laura ArtisAruna, MD

## 2017-04-08 NOTE — ED Triage Notes (Signed)
[facility].  Was recently admitted for CHF then went to rehab.  Has been having dyspnea and was [facility]. accessory muscle use, tachypnea and labored int triage. Has had swelling to legs.

## 2017-04-09 ENCOUNTER — Inpatient Hospital Stay
Admit: 2017-04-09 | Discharge: 2017-04-09 | Disposition: A | Discharge status: Home / Self Care | Payer: Medicare Other | Attending: Internal Medicine | Admitting: Internal Medicine

## 2017-04-09 LAB — BASIC METABOLIC PANEL
Anion gap: 10 (ref 5–15)
BUN: 46 mg/dL — ABNORMAL HIGH (ref 6–20)
CHLORIDE: 95 mmol/L — AB (ref 101–111)
CO2: 26 mmol/L (ref 22–32)
Calcium: 8.5 mg/dL — ABNORMAL LOW (ref 8.9–10.3)
Creatinine, Ser: 2.03 mg/dL — ABNORMAL HIGH (ref 0.44–1.00)
GFR calc non Af Amer: 20 mL/min — ABNORMAL LOW (ref >60)
GFR, EST AFRICAN AMERICAN: 24 mL/min — AB (ref >60)
Glucose, Bld: 89 mg/dL (ref 65–99)
POTASSIUM: 2.9 mmol/L — AB (ref 3.5–5.1)
SODIUM: 131 mmol/L — AB (ref 135–145)

## 2017-04-09 LAB — ECHOCARDIOGRAM COMPLETE
Height: 57 in
Weight: 2007.68 oz

## 2017-04-09 MED ORDER — POTASSIUM CHLORIDE CRYS ER 20 MEQ PO TBCR
20.0000 meq | EXTENDED_RELEASE_TABLET | Freq: Every day | ORAL | Status: DC
  Administered 2017-04-10: 20 meq via ORAL
  Filled 2017-04-09: qty 1

## 2017-04-09 MED ORDER — IPRATROPIUM-ALBUTEROL 0.5-2.5 (3) MG/3ML IN SOLN
3.0000 mL | Freq: Four times a day (QID) | RESPIRATORY_TRACT | Status: DC | PRN
  Administered 2017-04-09: 3 mL via RESPIRATORY_TRACT
  Filled 2017-04-09: qty 3

## 2017-04-09 MED ORDER — POTASSIUM CHLORIDE CRYS ER 20 MEQ PO TBCR
40.0000 meq | EXTENDED_RELEASE_TABLET | ORAL | Status: AC
  Administered 2017-04-09 (×3): 40 meq via ORAL
  Filled 2017-04-09 (×3): qty 2

## 2017-04-09 NOTE — Therapy (Signed)
Called to patient room by nurse for PRN Duoneb tx. Patient found on room air with SpO2 97%, RR 20, HR 114 irregular. Bilateral breath sounds wheezes and faint crackles. Post tx, breath sounds essentially unchanged.

## 2017-04-09 NOTE — Evaluation (Signed)
Physical Therapy Evaluation Patient Details Name: Laura [name] MRN: [id number] DOB: [birth year] Today's Date: 04/09/2017   History of Present Illness  83 y.o. female recently here with low sodium, weakness, and confusion.  PMH includes CHF, CKD, htn, and B TKR.  Discharged from rehab 5/17, returns with LE edema and admitted with acute on chronic CHF.  Clinical Impression  Pt did well with ambulation (~100 ft) with walker and reports she is weak, but generally walking near her baseline (family agreed).  She typically uses a rollator and reported that the walker did not feel as steady for her.  Pt's O2 remained in the mid 90s t/o the effort, her HR was variable but did not get higher than the 110s with the effort.  Pt should be able to return home safely with the assist she has available.    Follow Up Recommendations Home health PT    Equipment Recommendations       Recommendations for Other Services       Precautions / Restrictions Precautions Precautions: Fall Restrictions Weight Bearing Restrictions: No      Mobility  Bed Mobility Overal bed mobility: Needs Assistance Bed Mobility: Supine to Sit     Supine to sit: Supervision     General bed mobility comments: light use of rail, able to get to EOB w/o assist  Transfers Overall transfer level: Modified independent Equipment used: Rolling walker (2 wheeled) Transfers: Sit to/from Stand Sit to Stand: Min guard         General transfer comment: Pt able to rise w/o assist, showed good confidence  Ambulation/Gait Ambulation/Gait assistance: Supervision Ambulation Distance (Feet): 100 Feet Assistive device: Rolling walker (2 wheeled)       General Gait Details: Pt did well with ambulation, per family she seemed near her baseline and though she had some mild limp she was safe and had no LOBs.  Stairs            Wheelchair Mobility    Modified Rankin (Stroke Patients Only)       Balance Overall  balance assessment: Modified Independent   Sitting balance-Leahy Scale: Good     Standing balance support: Bilateral upper extremity supported Standing balance-Leahy Scale: Fair                               Pertinent Vitals/Pain Pain Assessment:  (chronic minimal LBP, no acute pain during PT exam)    Home Living Family/patient expects to be discharged to:: Assisted living Living Arrangements: Alone (will have paid aides or family most of the day)             Home Equipment: Walker - 4 wheels      Prior Function Level of Independence: Independent with assistive device(s)         Comments: Uses 4ww for functional mobility.  Reports 2 falls in last 6 months.  Chronic back and hip pain.  Per notes, pt with mild confusion baseline.     Hand Dominance        Extremity/Trunk Assessment   Upper Extremity Assessment Upper Extremity Assessment: Generalized weakness;Overall WFL for tasks assessed (age appropriate limitations)    Lower Extremity Assessment Lower Extremity Assessment: Overall WFL for tasks assessed;Generalized weakness (age appropriate limitations)       Communication   Communication: No difficulties  Cognition Arousal/Alertness: Awake/alert Behavior During Therapy: WFL for tasks assessed/performed Overall Cognitive Status: Within Functional Limits for tasks  assessed                                        General Comments      Exercises     Assessment/Plan    PT Assessment Patient needs continued PT services  PT Problem List         PT Treatment Interventions Gait training;Therapeutic exercise;Balance training;Functional mobility training;Therapeutic activities;Patient/family education    PT Goals (Current goals can be found in the Care Plan section)  Acute Rehab PT Goals Patient Stated Goal: to go home PT Goal Formulation: With patient Time For Goal Achievement: 04/20/17 Potential to Achieve Goals: Good     Frequency Min 2X/week   Barriers to discharge        Co-evaluation               AM-PAC PT "6 Clicks" Daily Activity  Outcome Measure Difficulty turning over in bed (including adjusting bedclothes, sheets and blankets)?: A Little Difficulty moving from lying on back to sitting on the side of the bed? : A Little Difficulty sitting down on and standing up from a chair with arms (e.g., wheelchair, bedside commode, etc,.)?: A Little Help needed moving to and from a bed to chair (including a wheelchair)?: A Little Help needed walking in hospital room?: A Little Help needed climbing 3-5 steps with a railing? : A Little 6 Click Score: 18    End of Session Equipment Utilized During Treatment: Gait belt Activity Tolerance: Patient tolerated treatment well Patient left: with chair alarm set;with call bell/phone within reach;with family/visitor present   PT Visit Diagnosis: Muscle weakness (generalized) (M62.81);Difficulty in walking, not elsewhere classified (R26.2)    Time: 1914-7829 PT Time Calculation (min) (ACUTE ONLY): 20 min   Charges:   PT Evaluation $PT Eval Low Complexity: 1 Procedure     PT G Codes:        Malachi Pro, DPT 04/09/2017, 5:14 PM

## 2017-04-09 NOTE — Progress Notes (Signed)
*  PRELIMINARY RESULTS* Echocardiogram 2D Echocardiogram has been performed.  Laura [name] S Clive Parcel 04/09/2017, 8:11 AM

## 2017-04-09 NOTE — Clinical Social Work Note (Signed)
CSW received consult for possible SNF placement. CSW will assess pending PT recommendations.  Argentina PonderKaren Martha Cherene Dobbins, MSW, Theresia MajorsLCSWA [phone]

## 2017-04-09 NOTE — Progress Notes (Signed)
Notified MD of K+2.9 Awaiting orders. Will continue to monitor and assess.

## 2017-04-09 NOTE — Progress Notes (Signed)
[employer] [facility] [place] [facility]   PATIENT NAME: Laura [name]    MR#:  [id number]  DATE OF BIRTH:  1924/08/10  SUBJECTIVE:  CHIEF COMPLAINT:   Chief Complaint  Patient presents with  . Atrial Fibrillation  . Shortness of Breath   - Recent discharge from rehabilitation. Admitted with severe shortness of breath and noted to have pulmonary edema. -Breathing much better. On room air today  REVIEW OF SYSTEMS:  Review of Systems  Constitutional: Negative for chills, fever and malaise/fatigue.  HENT: Negative for congestion, ear discharge, hearing loss and nosebleeds.   Eyes: Negative for blurred vision and double vision.  Respiratory: Positive for shortness of breath. Negative for cough and wheezing.   Cardiovascular: Negative for chest pain, palpitations and leg swelling.  Gastrointestinal: Negative for abdominal pain, constipation, diarrhea, nausea and vomiting.  Genitourinary: Negative for dysuria.  Musculoskeletal: Negative for myalgias.  Neurological: Negative for dizziness, speech change, focal weakness, seizures and headaches.  Psychiatric/Behavioral: Negative for depression.    DRUG ALLERGIES:   Allergies  Allergen Reactions  . Penicillins Shortness Of Breath        . Codeine Itching  . Amlodipine Rash    Peripheral edema  . Ciprofloxacin Rash  . Sulfa Antibiotics Rash    VITALS:  Blood pressure (!) 146/86, pulse 76, temperature 98.4 F (36.9 C), temperature source Oral, resp. rate 16, height 4\' 9"  (1.448 m), weight 56.9 kg (125 lb 7.7 oz), SpO2 94 %.  PHYSICAL EXAMINATION:  Physical Exam  GENERAL:  83 y.o.-year-old elderly patient lying in the bed with no acute distress.  EYES: Pupils equal, round, reactive to light and accommodation. No scleral icterus. Extraocular muscles intact.  HEENT: Head atraumatic, normocephalic. Oropharynx and nasopharynx clear.  NECK:  Supple, no jugular venous distention. No thyroid enlargement, no  tenderness.  LUNGS: Normal breath sounds bilaterally, no wheezing, rhonchi or crepitation. Fine bibasilar rales. No use of accessory muscles of respiration.  CARDIOVASCULAR: S1, S2 normal. No rubs, or gallops. 3/6 systolic murmur present ABDOMEN: Soft, nontender, nondistended. Bowel sounds present. No organomegaly or mass.  EXTREMITIES: No pedal edema, cyanosis, or clubbing.  NEUROLOGIC: Cranial nerves II through XII are intact. Muscle strength 5/5 in all extremities. Sensation intact. Gait not checked.  PSYCHIATRIC: The patient is alert and oriented x 3.  SKIN: No obvious rash, lesion, or ulcer.    LABORATORY PANEL:   CBC  Recent Labs Lab 04/08/17 1234  WBC 4.7  HGB 10.6*  HCT 31.9*  PLT 233   ------------------------------------------------------------------------------------------------------------------  Chemistries   Recent Labs Lab 04/09/17 0425  NA 131*  K 2.9*  CL 95*  CO2 26  GLUCOSE 89  BUN 46*  CREATININE 2.03*  CALCIUM 8.5*   ------------------------------------------------------------------------------------------------------------------  Cardiac Enzymes  Recent Labs Lab 04/08/17 1234  TROPONINI 0.03*   ------------------------------------------------------------------------------------------------------------------  RADIOLOGY:  Dg Chest Portable 1 View  Result Date: 04/08/2017 CLINICAL DATA:  Was recently admitted for CHF then went to rehab. Has been having dyspnea and was in afib rvr at [facility]. accessory muscle use, tachypnea and labored int triage. Has had swelling to legs. EXAM: PORTABLE CHEST 1 VIEW COMPARISON:  03/17/2017 FINDINGS: Cardiac silhouette is mildly enlarged, stable from prior exam. No mediastinal or hilar masses. There are thickened bronchovascular markings bilaterally. Thickening is noted along the minor fissure. There is evidence of small pleural effusions. No evidence of pneumonia. No pneumothorax. Bony thorax is demineralized but  grossly intact. Findings are stable when compared to the prior study.  IMPRESSION: 1. No convincing acute cardiopulmonary disease. No overt pulmonary edema or evidence of pneumonia. 2. Cardiomegaly with small pleural effusions similar to the prior chest radiograph. Electronically Signed   By: Amie Portlandavid  Ormond M.D.   On: 04/08/2017 13:02    EKG:   Orders placed or performed during the hospital encounter of 04/08/17  . EKG 12-Lead  . EKG 12-Lead  . ED EKG within 10 minutes  . ED EKG within 10 minutes    ASSESSMENT AND PLAN:   83 year old female with past medical history significant for atrial fibrillation not on anticoagulation, diastolic CHF, hypertension, CK D, hyperlipidemia presents from home secondary to dyspnea and noted to have CHF and also was in A. fib with RVR.  #1 acute on chronic diastolic CHF exacerbation-likely secondary to dietary noncompliance at the rehabilitation. -Strict low sodium diet, daily weights. Urine output monitoring -Continue IV Lasix at this time. Also on oral metolazone -Appreciate cardiology consult. Recent echocardiogram with normal ejection fraction.  #2 hypokalemia-secondary to being on Lasix. Being replaced and started on daily supplements.  #3 atrial fibrillation with rapid ventricular response-likely triggered from her CHF. Rate well controlled at this time. Continue Coreg and Cardizem. -Only on aspirin for anticoagulation  #4 hypertension-patient on Coreg, Cardizem, lisinopril, Imdur and hydralazine. Monitor  #5 GERD-continue Protonix  #6 DVT prophylaxis-on Lovenox.  Physical therapy consulted   All the records are reviewed and case discussed with Care Management/Social Workerr. Management plans discussed with the patient, family and they are in agreement.  CODE STATUS: Full code  TOTAL TIME TAKING CARE OF THIS PATIENT: 38 minutes.   POSSIBLE D/C IN 1-2 DAYS, DEPENDING ON CLINICAL CONDITION.   Enid BaasKALISETTI,Evin Loiseau M.D on 04/09/2017 at 9:42  AM  Between 7am to 6pm - Pager - [phone]  After 6pm go to [email or web address] - Social research officer, governmentpassword EPAS [facility]  [facility]  Office  [phone]  CC: Primary care physician; Marisue IvanLinthavong, [place], MD

## 2017-04-09 NOTE — Consult Note (Signed)
[facility] Cardiology Consultation Note  Patient ID: Laura [name], MRN: [id number], DOB/AGE: 1924-07-02 83 y.o. Admit date: 04/08/2017   Date of Consult: 04/09/2017 Primary Physician: Marisue Ivan, MD Primary Cardiologist: [facility]  Chief Complaint:  Chief Complaint  Patient presents with  . Atrial Fibrillation  . Shortness of Breath   Reason for Consult: acute on chronic systolic dysfunction congestive heart failure  HPI: 83 y.o. female with the known chronic kidney disease stage 4 essential hypertension mixed hyperlipidemia chronic nonvalvular atrial fibrillation recently admitted to the hospital for acute on chronic heart failure for which the patient received appropriate medication management. Patient was discharged home and/or outside facility for which she appeared to have the appropriate diet and low-sodium intake although there is some question of this. During that time the patient had weight gain of approximately 5 pounds with significant shortness of breath weakness fatigue PND and orthopnea. When seen in the emergency room she had pulmonary edema with hypoxia and atrial fibrillation with rapid ventricular rate. She had previously been on appropriate medication management for atrial fibrillation with reasonable control and currently has slightly improved since her pulmonary edema has been controlled. The patient has had no evidence of myocardial infarction with a troponin of 0.03 and does currently have a BNP of 2049. Overnight with intravenous Lasix the patient has had significant improvement of symptoms and appears to be back to baseline at this time  Past Medical History:  Diagnosis Date  . Anemia   . Arrhythmia    atrial fibrillation  . Atrial fibrillation (HCC)   . CHF (congestive heart failure) (HCC)   . Chronic kidney disease   . GERD (gastroesophageal reflux disease)   . GI bleed   . High cholesterol   . Hypertension   . Hyponatremia   . Macular  degeneration   . SVT (supraventricular tachycardia) [facility])       Surgical History:  Past Surgical History:  Procedure Laterality Date  . ABDOMINAL HYSTERECTOMY    . JOINT REPLACEMENT    . REPLACEMENT TOTAL KNEE BILATERAL    . VAGINAL HYSTERECTOMY     Partial     Home Meds: Prior to Admission medications   Medication Sig Start Date End Date Taking? Authorizing Provider  acetaminophen (TYLENOL) 500 MG tablet Take 1,000 mg by mouth daily. Take 2 tabs (1000mg ) by mouth every morning.  Take 2 tablets in evening if needed.   Yes [provider]  aspirin EC 81 MG tablet Take 81 mg by mouth daily.   Yes [provider]  carvedilol (COREG) 25 MG tablet Take 1 tablet (25 mg total) by mouth 2 (two) times daily with a meal. 02/27/17  Yes Mody, Sital, MD  diltiazem (CARDIZEM CD) 240 MG 24 hr capsule Take 1 capsule (240 mg total) by mouth daily. 02/28/17  Yes Mody, Patricia Pesa, MD  docusate sodium (COLACE) 100 MG capsule Take 100 mg by mouth 2 (two) times daily. 01/02/17  Yes [provider]  Ferrous Gluconate 324 (37.5 Fe) MG TABS Take 1 tablet by mouth 2 (two) times daily. 11/15/16  Yes [provider]  fluticasone (FLONASE) 50 MCG/ACT nasal spray Place 2 sprays into both nostrils daily.   Yes [provider]  hydrALAZINE (APRESOLINE) 100 MG tablet Take 100 mg by mouth 2 (two) times daily. 12/23/16  Yes [provider]  isosorbide mononitrate (IMDUR) 120 MG 24 hr tablet Take 1 tablet by mouth daily. 12/23/16  Yes [provider]  lisinopril (PRINIVIL,ZESTRIL) 10  MG tablet Take 10 mg by mouth daily. 11/15/16  Yes [provider]  Lutein 20 MG TABS Take 1 tablet by mouth daily.   Yes [provider]  Multiple Vitamins-Minerals (PRESERVISION AREDS PO) Take 1 tablet by mouth daily.    Yes [provider]  pantoprazole (PROTONIX) 40 MG tablet Take 40 mg by mouth daily. 12/23/16  Yes [provider]  simvastatin (ZOCOR) 40  MG tablet Take 40 mg by mouth daily at 6 PM.  12/23/16  Yes [provider]  torsemide (DEMADEX) 20 MG tablet Take 20 mg by mouth daily.    Yes [provider]  traMADol (ULTRAM) 50 MG tablet Take 50 mg by mouth every 4 (four) hours as needed.   Yes [provider]    Inpatient Medications:  . acetaminophen  1,000 mg Oral Daily  . aspirin EC  81 mg Oral Daily  . carvedilol  25 mg Oral BID WC  . diltiazem  240 mg Oral Daily  . enoxaparin (LOVENOX) injection  30 mg Subcutaneous Q24H  . ferrous gluconate  240 mg Oral BID WC  . fluticasone  2 spray Each Nare Daily  . furosemide  40 mg Intravenous Q12H  . hydrALAZINE  100 mg Oral BID  . isosorbide mononitrate  120 mg Oral Daily  . lisinopril  10 mg Oral Daily  . metolazone  2.5 mg Oral Daily  . multivitamin-lutein  1 capsule Oral Daily  . pantoprazole  40 mg Oral Daily  . simvastatin  40 mg Oral q1800  . sodium chloride flush  3 mL Intravenous Q12H   . sodium chloride      Allergies:  Allergies  Allergen Reactions  . Penicillins Shortness Of Breath        . Codeine Itching  . Amlodipine Rash    Peripheral edema  . Ciprofloxacin Rash  . Sulfa Antibiotics Rash    Social History   Social History  . Marital status: Widowed    Spouse name: N/A  . Number of children: N/A  . Years of education: N/A   Occupational History  . Not on file.   Social History Main Topics  . Smoking status: Never Smoker  . Smokeless tobacco: Never Used  . Alcohol use No  . Drug use: No  . Sexual activity: Not on file   Other Topics Concern  . Not on file   Social History Narrative  . No narrative on file     Family History  Problem Relation Age of Onset  . Hypertension Mother   . CAD Father   . Heart disease Father   . Arthritis Father   . Pancreatic cancer Son   . Heart failure Sister   . Diabetes Brother   . Heart failure Sister   . Diabetes Son      Review of Systems Positive for PND orthopnea  shortness of breath Negative for: General:  chills, fever, night sweats or weight changes.  Cardiovascular: Positive for PND orthopnea negative for syncope dizziness  Dermatological skin lesions rashes Respiratory: Cough congestion Urologic: Frequent urination urination at night and hematuria Abdominal: negative for nausea, vomiting, diarrhea, bright red blood per rectum, melena, or hematemesis Neurologic: negative for visual changes, and/or hearing changes  All other systems reviewed and are otherwise negative except as noted above.  Labs:  Recent Labs  04/08/17 1234  TROPONINI 0.03*   Lab Results  Component Value Date   WBC 4.7 04/08/2017   HGB 10.6 (L)  04/08/2017   HCT 31.9 (L) 04/08/2017   MCV 92.1 04/08/2017   PLT 233 04/08/2017    Recent Labs Lab 04/09/17 0425  NA 131*  K 2.9*  CL 95*  CO2 26  BUN 46*  CREATININE 2.03*  CALCIUM 8.5*  GLUCOSE 89   Lab Results  Component Value Date   CHOL 103 03/18/2017   HDL 53 03/18/2017   LDLCALC 38 03/18/2017   TRIG 62 03/18/2017   No results found for: DDIMER  Radiology/Studies:  Dg Chest 2 View  Result Date: 03/17/2017 CLINICAL DATA:  Congestive heart failure.  Low sodium. EXAM: CHEST  2 VIEW COMPARISON:  02/22/2017 FINDINGS: Chronic cardiomegaly. Chronic aortic atherosclerosis with calcification and tortuosity. Small bilateral pleural effusions. Mild basilar atelectasis. Pulmonary venous hypertension without frank edema. IMPRESSION: Cardiomegaly.  Small effusions.  Basilar atelectasis. Electronically Signed   By: Paulina FusiMark  Shogry M.D.   On: 03/17/2017 18:34   Dg Chest Portable 1 View  Result Date: 04/08/2017 CLINICAL DATA:  Was recently admitted for CHF then went to rehab. Has been having dyspnea and [facility]. accessory muscle use, tachypnea and labored int triage. Has had swelling to legs. EXAM: PORTABLE CHEST 1 VIEW COMPARISON:  03/17/2017 FINDINGS: Cardiac silhouette is mildly enlarged, stable from prior  exam. No mediastinal or hilar masses. There are thickened bronchovascular markings bilaterally. Thickening is noted along the minor fissure. There is evidence of small pleural effusions. No evidence of pneumonia. No pneumothorax. Bony thorax is demineralized but grossly intact. Findings are stable when compared to the prior study. IMPRESSION: 1. No convincing acute cardiopulmonary disease. No overt pulmonary edema or evidence of pneumonia. 2. Cardiomegaly with small pleural effusions similar to the prior chest radiograph. Electronically Signed   By: Amie Portlandavid  Ormond M.D.   On: 04/08/2017 13:02    EKG: Atrial fibrillation with rapid ventricular rate and nonspecific ST and T-wave changes  Weights: Filed Weights   04/08/17 1232 04/08/17 1705 04/09/17 0530  Weight: 59 kg (130 lb) 57 kg (125 lb 11.2 oz) 56.9 kg (125 lb 7.7 oz)     Physical Exam: Blood pressure (!) 146/86, pulse 76, temperature 98.4 F (36.9 C), resp. rate 16, height 4\' 9"  (1.448 m), weight 56.9 kg (125 lb 7.7 oz), SpO2 94 %. Body mass index is 27.15 kg/m. General: Well developed, well nourished, in no acute distress. Head eyes ears nose throat: Normocephalic, atraumatic, sclera non-icteric, no xanthomas, nares are without discharge. No apparent thyromegaly and/or mass  Lungs: Normal respiratory effort.  no wheezes, Basilar rales, no rhonchi.  Heart: Irregular with normal S1 S2. no murmur gallop, no rub, PMI is normal size and placement, carotid upstroke normal without bruit, jugular venous pressure is normal Abdomen: Soft, non-tender, non-distended with normoactive bowel sounds. No hepatomegaly. No rebound/guarding. No obvious abdominal masses. Abdominal aorta is normal size without bruit Extremities: Trace edema. no cyanosis, no clubbing, no ulcers  Peripheral : 2+ bilateral upper extremity pulses, 2+ bilateral femoral pulses, 2+ bilateral dorsal pedal pulse Neuro: Alert and oriented. No facial asymmetry. No focal deficit. Moves all  extremities spontaneously. Musculoskeletal: Normal muscle tone without kyphosis Psych:  Responds to questions appropriately with a normal affect.    Assessment: 83 year old female with the acute on chronic systolic dysfunction congestive heart failure essential hypertension makes hyperlipidemia likely exacerbated by chronic kidney disease stage IV a dietary indiscretion and atrial fibrillation with rapid ventricular rate without evidence of myocardial infarction slowly improving with appropriate medication management  Plan: 1. Continue  intravenous Lasix and changed back to oral Lasix and metolazone combination which appeared to be working relatively well in the past 2. Heart rate control of atrial fibrillation with combination of carvedilol and diltiazem for goal heart rate between 60 and 90 bpm 3. Continue close monitoring of chronic kidney disease likely exacerbating current concerns of heart failure 4. Very close monitoring of sodium in diet which may contribute to above issues 5. No further cardiac diagnostics this time with no evidence of myocardial infarction 6. Begin ambulation and follow for improvements of symptoms and need for adjustments of medication management but further outpatient management as needed  Signed, Lamar Blinks M.D. [facility] Cardiology 04/09/2017, 6:09 AM

## 2017-04-10 ENCOUNTER — Inpatient Hospital Stay: Payer: Medicare Other

## 2017-04-10 LAB — BASIC METABOLIC PANEL
ANION GAP: 8 (ref 5–15)
BUN: 51 mg/dL — ABNORMAL HIGH (ref 6–20)
CALCIUM: 8.7 mg/dL — AB (ref 8.9–10.3)
CO2: 26 mmol/L (ref 22–32)
Chloride: 97 mmol/L — ABNORMAL LOW (ref 101–111)
Creatinine, Ser: 2.15 mg/dL — ABNORMAL HIGH (ref 0.44–1.00)
GFR, EST AFRICAN AMERICAN: 22 mL/min — AB (ref >60)
GFR, EST NON AFRICAN AMERICAN: 19 mL/min — AB (ref >60)
Glucose, Bld: 89 mg/dL (ref 65–99)
Potassium: 4.7 mmol/L (ref 3.5–5.1)
SODIUM: 131 mmol/L — AB (ref 135–145)

## 2017-04-10 LAB — GLUCOSE, CAPILLARY
GLUCOSE-CAPILLARY: 126 mg/dL — AB (ref 65–99)
Glucose-Capillary: 86 mg/dL (ref 65–99)

## 2017-04-10 MED ORDER — FUROSEMIDE 40 MG PO TABS
40.0000 mg | ORAL_TABLET | Freq: Two times a day (BID) | ORAL | Status: DC
  Filled 2017-04-10: qty 1

## 2017-04-10 MED ORDER — LEVOFLOXACIN 250 MG PO TABS: 250.0000 mg | ORAL_TABLET | Freq: Every day | ORAL | 0 refills | Status: DC

## 2017-04-10 MED ORDER — TORSEMIDE 20 MG PO TABS: 20.0000 mg | ORAL_TABLET | Freq: Every day | ORAL | 2 refills | Status: DC

## 2017-04-10 MED ORDER — MOMETASONE FURO-FORMOTEROL FUM 200-5 MCG/ACT IN AERO: 2.0000 | INHALATION_SPRAY | Freq: Two times a day (BID) | RESPIRATORY_TRACT | 2 refills | Status: AC

## 2017-04-10 MED ORDER — METHYLPREDNISOLONE SODIUM SUCC 125 MG IJ SOLR
60.0000 mg | INTRAMUSCULAR | Status: DC
  Filled 2017-04-10: qty 2

## 2017-04-10 MED ORDER — PREDNISONE 10 MG (21) PO TBPK: ORAL_TABLET | ORAL | 0 refills | Status: DC

## 2017-04-10 MED ORDER — GUAIFENESIN ER 600 MG PO TB12
600.0000 mg | ORAL_TABLET | Freq: Two times a day (BID) | ORAL | Status: DC
  Administered 2017-04-10: 600 mg via ORAL
  Filled 2017-04-10: qty 1

## 2017-04-10 MED ORDER — IPRATROPIUM BROMIDE HFA 17 MCG/ACT IN AERS: 2.0000 | INHALATION_SPRAY | Freq: Four times a day (QID) | RESPIRATORY_TRACT | 2 refills | Status: DC

## 2017-04-10 MED ORDER — IPRATROPIUM-ALBUTEROL 0.5-2.5 (3) MG/3ML IN SOLN: 3.0000 mL | Freq: Four times a day (QID) | RESPIRATORY_TRACT | 1 refills | Status: AC | PRN

## 2017-04-10 MED ORDER — LEVOFLOXACIN 500 MG PO TABS: 500.0000 mg | ORAL_TABLET | ORAL | Status: DC

## 2017-04-10 MED ORDER — MOMETASONE FURO-FORMOTEROL FUM 200-5 MCG/ACT IN AERO
2.0000 | INHALATION_SPRAY | Freq: Two times a day (BID) | RESPIRATORY_TRACT | Status: DC
  Filled 2017-04-10: qty 8.8

## 2017-04-10 MED ORDER — LEVOFLOXACIN 750 MG PO TABS
750.0000 mg | ORAL_TABLET | Freq: Every day | ORAL | Status: AC
  Administered 2017-04-10: 750 mg via ORAL
  Filled 2017-04-10: qty 1

## 2017-04-10 MED ORDER — LEVOFLOXACIN 500 MG PO TABS: 500.0000 mg | ORAL_TABLET | ORAL | 0 refills | Status: DC

## 2017-04-10 NOTE — Consult Note (Signed)
   [facility]   04/10/2017  Guinevere ScarletLucy N Guyton 08-Oct-1924 [id number]   [facility]. However, [facility]. Will make inpatient RNCM aware.   Raiford NobleAtika Kylin Genna, MSN-Ed, RN,BSN [facility] Hospital Liaison [phone]

## 2017-04-10 NOTE — Clinical Social Work Note (Signed)
CSW was monitoring in case patient needs SNF.  Case discussed with case manager and plan is to discharge home with home health.  CSW to sign off please re-consult if social work needs arise.  Ervin KnackEric R. Noriel Guthrie, MSW, [employer]CSWA [phone]

## 2017-04-10 NOTE — Care Management Important Message (Signed)
Important Message  Patient Details  Name: Laura [name] MRN: [id number] Date of Birth: [birth year]   Medicare Important Message Given:  Yes    Eber HongGreene, Marilyn Nihiser R, RN 04/10/2017, 5:03 PM

## 2017-04-10 NOTE — Discharge Instructions (Signed)
Heart Failure Clinic appointment on Apr 19, 2017 at 10:40am with Laura Kindredina Louine Tenpenny, FNP. Please call [phone] to reschedule.

## 2017-04-10 NOTE — Progress Notes (Signed)
Pharmacy Antibiotic Note  Laura [name] is a 83 y.o. female admitted on 04/08/2017 with CAP.  Pharmacy has been consulted for levofloxacin dosing.  Plan: Levofloxacin 750 mg PO x 1 followed by levofloxacin 500 mg po Q48H due to CrCl 10 to 19 mL/min.   Height: 4\' 9"  (144.8 cm) Weight: 121 lb 12.8 oz (55.2 kg) IBW/kg (Calculated) : 38.6  Temp (24hrs), Avg:98.1 F (36.7 C), Min:97.7 F (36.5 C), Max:98.2 F (36.8 C)   Recent Labs Lab 04/06/17 1600 04/08/17 1234 04/09/17 0425 04/10/17 0419  WBC 4.3 4.7  --   --   CREATININE  --  1.91* 2.03* 2.15*    Estimated Creatinine Clearance: 12.4 mL/min (A) (by C-G formula based on SCr of 2.15 mg/dL (H)).    Allergies  Allergen Reactions  . Penicillins Shortness Of Breath        . Codeine Itching  . Amlodipine Rash    Peripheral edema  . Ciprofloxacin Rash  . Sulfa Antibiotics Rash    Thank you for allowing pharmacy to be a part of this patient's care.  Carola FrostNathan A Joseguadalupe Stan, Pharm.D., BCPS Clinical Pharmacist 04/10/2017 10:41 AM

## 2017-04-10 NOTE — Discharge Summary (Signed)
[facility] [place] at [facility]   PATIENT NAME: Laura [name]    MR#:  [id number]  DATE OF BIRTH:  [birth year]  DATE OF ADMISSION:  04/08/2017   ADMITTING PHYSICIAN: Ramonita Lab, MD  DATE OF DISCHARGE: 04/10/2017  PRIMARY CARE PHYSICIAN: Marisue Ivan, MD   ADMISSION DIAGNOSIS:   Atrial fibrillation with RVR (HCC) [I48.91] Acute on chronic congestive heart failure, unspecified heart failure type (HCC) [I50.9]  DISCHARGE DIAGNOSIS:   Active Problems:   Acute CHF (congestive heart failure) (HCC)   SECONDARY DIAGNOSIS:   Past Medical History:  Diagnosis Date  . Anemia   . Arrhythmia    atrial fibrillation  . Atrial fibrillation (HCC)   . CHF (congestive heart failure) (HCC)   . Chronic kidney disease   . GERD (gastroesophageal reflux disease)   . GI bleed   . High cholesterol   . Hypertension   . Hyponatremia   . Macular degeneration   . SVT (supraventricular tachycardia) [facility])     HOSPITAL COURSE:   83 year old female with past medical history significant for atrial fibrillation not on anticoagulation, diastolic CHF, hypertension, CK D, hyperlipidemia presents from home secondary to dyspnea and noted to have CHF and also was in A. fib with RVR.  #1 acute on chronic diastolic CHF exacerbation-likely secondary to dietary noncompliance at the rehabilitation. -Strict low sodium diet, daily weights. -Received IV Lasix in the hospital, being discharged on home dose of torsemide -Appreciate cardiology consult. Recent echocardiogram with normal ejection fraction. -Follow up with CHF clinic after discharge  #2 right lower lobe pneumonia and reactive airway disease-no history of COPD but has significant wheezing on exam yesterday. -Started on nebulizer treatments and inhalers. -Chest x-ray with new infiltrate. Started on oral Levaquin. Saturations are normal. -Also on a prednisone taper -Repeat x-ray in 3 weeks to ensure resolution, if no  resolution noted, will need a CT chest.  #3 atrial fibrillation with rapid ventricular response-likely triggered from her CHF. Rate well controlled at this time. Continue Coreg and Cardizem. -Only on aspirin for anticoagulation  #4 hypertension-patient on Coreg, Cardizem, lisinopril, Imdur and hydralazine. Monitor  #5 GERD-continue Protonix  Will be discharged with home health today  DISCHARGE CONDITIONS:   Guarded  CONSULTS OBTAINED:   Treatment Team:  Lamar Blinks, MD  DRUG ALLERGIES:   Allergies  Allergen Reactions  . Penicillins Shortness Of Breath        . Codeine Itching  . Amlodipine Rash    Peripheral edema  . Ciprofloxacin Rash  . Sulfa Antibiotics Rash   DISCHARGE MEDICATIONS:   Allergies as of 04/10/2017      Reactions   Penicillins Shortness Of Breath      Codeine Itching   Amlodipine Rash   Peripheral edema   Ciprofloxacin Rash   Sulfa Antibiotics Rash      Medication List    TAKE these medications   acetaminophen 500 MG tablet Commonly known as:  TYLENOL Take 1,000 mg by mouth daily. Take 2 tabs (1000mg ) by mouth every morning.  Take 2 tablets in evening if needed.   aspirin EC 81 MG tablet Take 81 mg by mouth daily.   carvedilol 25 MG tablet Commonly known as:  COREG Take 1 tablet (25 mg total) by mouth 2 (two) times daily with a meal.   diltiazem 240 MG 24 hr capsule Commonly known as:  CARDIZEM CD Take 1 capsule (240 mg total) by mouth daily.   docusate sodium 100 MG capsule  Commonly known as:  COLACE Take 100 mg by mouth 2 (two) times daily.   Ferrous Gluconate 324 (37.5 Fe) MG Tabs Take 1 tablet by mouth 2 (two) times daily.   fluticasone 50 MCG/ACT nasal spray Commonly known as:  FLONASE Place 2 sprays into both nostrils daily.   hydrALAZINE 100 MG tablet Commonly known as:  APRESOLINE Take 100 mg by mouth 2 (two) times daily.   ipratropium 17 MCG/ACT inhaler Commonly known as:  ATROVENT HFA Inhale 2 puffs  into the lungs 4 (four) times daily.   ipratropium-albuterol 0.5-2.5 (3) MG/3ML Soln Commonly known as:  DUONEB Take 3 mLs by nebulization every 6 (six) hours as needed.   isosorbide mononitrate 120 MG 24 hr tablet Commonly known as:  IMDUR Take 1 tablet by mouth daily.   levofloxacin 500 MG tablet Commonly known as:  LEVAQUIN Take 1 tablet (500 mg total) by mouth every other day. X 8 more days Start taking on:  04/12/2017   lisinopril 10 MG tablet Commonly known as:  PRINIVIL,ZESTRIL Take 10 mg by mouth daily.   Lutein 20 MG Tabs Take 1 tablet by mouth daily.   mometasone-formoterol 200-5 MCG/ACT Aero Commonly known as:  DULERA Inhale 2 puffs into the lungs 2 (two) times daily.   pantoprazole 40 MG tablet Commonly known as:  PROTONIX Take 40 mg by mouth daily.   predniSONE 10 MG (21) Tbpk tablet Commonly known as:  STERAPRED UNI-PAK 21 TAB 6 tabs PO x 1 day 5 tabs PO x 1 day 4 tabs PO x 1 day 3 tabs PO x 1 day 2 tabs PO x 1 day 1 tab PO x 1 day and stop   PRESERVISION AREDS PO Take 1 tablet by mouth daily.   simvastatin 40 MG tablet Commonly known as:  ZOCOR Take 40 mg by mouth daily at 6 PM.   torsemide 20 MG tablet Commonly known as:  DEMADEX Take 1 tablet (20 mg total) by mouth daily. Take another 20mg  in the afternoon if feeling short of breath. What changed:  additional instructions   traMADol 50 MG tablet Commonly known as:  ULTRAM Take 50 mg by mouth every 4 (four) hours as needed.        DISCHARGE INSTRUCTIONS:   1. PCP f/u in 1-2 weeks 2. Cardiology f/u in 2 weeks  DIET:   Cardiac diet  ACTIVITY:   Activity as tolerated  OXYGEN:   Home Oxygen: No.  Oxygen Delivery: room air  DISCHARGE LOCATION:   home   If you experience worsening of your admission symptoms, develop shortness of breath, life threatening emergency, suicidal or homicidal thoughts you must seek medical attention immediately by calling 911 or calling your MD immediately   if symptoms less severe.  You Must read complete instructions/literature along with all the possible adverse reactions/side effects for all the Medicines you take and that have been prescribed to you. Take any new Medicines after you have completely understood and accpet all the possible adverse reactions/side effects.   Please note  You were cared for by a hospitalist during your hospital stay. If you have any questions about your discharge medications or the care you received while you were in the hospital after you are discharged, you can call the unit and asked to speak with the hospitalist on call if the hospitalist that took care of you is not available. Once you are discharged, your primary care physician will handle any further medical issues. Please note that NO  REFILLS for any discharge medications will be authorized once you are discharged, as it is imperative that you return to your primary care physician (or establish a relationship with a primary care physician if you do not have one) for your aftercare needs so that they can reassess your need for medications and monitor your lab values.    On the day of Discharge:  VITAL SIGNS:   Blood pressure (!) 143/97, pulse (!) 103, temperature 98 F (36.7 C), resp. rate 18, height 4\' 9"  (1.448 m), weight 55.2 kg (121 lb 12.8 oz), SpO2 92 %.  PHYSICAL EXAMINATION:   GENERAL:  83 y.o.-year-old elderly patient lying in the bed with no acute distress.  EYES: Pupils equal, round, reactive to light and accommodation. No scleral icterus. Extraocular muscles intact.  HEENT: Head atraumatic, normocephalic. Oropharynx and nasopharynx clear.  NECK:  Supple, no jugular venous distention. No thyroid enlargement, no tenderness.  LUNGS: Normal breath sounds bilaterally, scattered wheezing, no rhonchi or crepitation. Fine bibasilar rales. No use of accessory muscles of respiration.  CARDIOVASCULAR: S1, S2 normal. No rubs, or gallops. 3/6 systolic murmur  present ABDOMEN: Soft, nontender, nondistended. Bowel sounds present. No organomegaly or mass.  EXTREMITIES: No pedal edema, cyanosis, or clubbing.  NEUROLOGIC: Cranial nerves II through XII are intact. Muscle strength 5/5 in all extremities. Sensation intact. Gait not checked.  PSYCHIATRIC: The patient is alert and oriented x 3.  SKIN: No obvious rash, lesion, or ulcer.   DATA REVIEW:   CBC  Recent Labs Lab 04/08/17 1234  WBC 4.7  HGB 10.6*  HCT 31.9*  PLT 233    Chemistries   Recent Labs Lab 04/10/17 0419  NA 131*  K 4.7  CL 97*  CO2 26  GLUCOSE 89  BUN 51*  CREATININE 2.15*  CALCIUM 8.7*     Microbiology Results  Results for orders placed or performed during the hospital encounter of 04/08/17  MRSA PCR Screening     Status: None   Collection Time: 04/08/17  6:15 PM  Result Value Ref Range Status   MRSA by PCR NEGATIVE NEGATIVE Final    Comment:        The GeneXpert MRSA Assay (FDA approved for NASAL specimens only), is one component of a comprehensive MRSA colonization surveillance program. It is not intended to diagnose MRSA infection nor to guide or monitor treatment for MRSA infections.     RADIOLOGY:  Dg Chest 2 View  Result Date: 04/10/2017 CLINICAL DATA:  83 year old female with history of low sodium, weakness and confusion. EXAM: CHEST  2 VIEW COMPARISON:  Chest x-ray 04/08/2017. FINDINGS: Focal mass-like consolidative opacity in the medial aspect of the right lower lobe concerning for pneumonia. Small bilateral pleural effusions. No evidence of pulmonary edema. Heart size is mildly enlarged. The patient is rotated to the right on today's exam, resulting in distortion of the mediastinal contours and reduced diagnostic sensitivity and specificity for mediastinal pathology. Aortic atherosclerosis. IMPRESSION: 1. Mass-like area of consolidation in the medial aspect of the right lower lobe concerning for pneumonia. Followup PA and lateral chest X-ray is  recommended in 3-4 weeks following trial of antibiotic therapy to ensure resolution and exclude underlying malignancy. 2. Small bilateral pleural effusions. 3. Mild cardiomegaly. 4. Aortic atherosclerosis. Electronically Signed   By: Trudie Reed M.D.   On: 04/10/2017 07:50     Management plans discussed with the patient, family and they are in agreement.  CODE STATUS:     Code Status Orders  Start     Ordered   04/08/17 1654  Do not attempt resuscitation (DNR)  Continuous    Question Answer Comment  In the event of cardiac or respiratory ARREST Do not call a "code blue"   In the event of cardiac or respiratory ARREST Do not perform Intubation, CPR, defibrillation or ACLS   In the event of cardiac or respiratory ARREST Use medication by any route, position, wound care, and other measures to relive pain and suffering. May use oxygen, suction and manual treatment of airway obstruction as needed for comfort.   Comments RN may pronounce      04/08/17 1654    Code Status History    Date Active Date Inactive Code Status Order ID Comments User Context   03/17/2017 10:31 PM 03/21/2017  3:20 PM DNR [id number]  Ramonita Lab, MD Inpatient   02/19/2017 11:06 PM 02/27/2017  9:18 PM DNR [id number]  Altamese Dilling, MD Inpatient      TOTAL TIME TAKING CARE OF THIS PATIENT: 38 minutes.    Enid Baas M.D on 04/10/2017 at 2:38 PM  Between 7am to 6pm - Pager - [phone]  After 6pm go to [email or web address] - Social research officer, government  [facility] [place] [facility]  Office  [phone]  CC: Primary care physician; Marisue Ivan, MD   Note: This dictation was prepared with Dragon dictation along with smaller phrase technology. Any transcriptional errors that result from this process are unintentional.

## 2017-04-10 NOTE — Care Management (Signed)
[facility] and presents to ED 5/19 with shortness of breath.  [facility] open referral on patient and was to have seen patient 5/19.  Informed by liaison that agency tried to call patient to schedule home visit and there was no answer.  Family says that when they called [facility], they were informed a home visit was scheduled for Monday.  Obtained order for  home health SN PT OT Aide, social work.  Family has hired a Comptrollersitter for Monday through Friday from about 8A to 3P.  Provided patient's  son with list of agencies that can provide in home continuous care. As patient was being discharged, son relayed that patient being discharged on nebulizer treatments and patient does not have a machine.  Says he was informed "home health" would bring it.  Discussed with son the home health agency does not have an order for the machine.  Obtained order from attending and asked [facility] to provide the nebulizer.  Son will pick it up.  Verbalizes understanding how to use device as he is a Teacher, early years/prepharmacist.

## 2017-04-10 NOTE — Progress Notes (Signed)
F/U appointment scheduled at the [facility] on Apr 19, 2017 at 10:40am. Of note, she did not show for her most recent appointment on 04/06/17.

## 2017-04-10 NOTE — Progress Notes (Signed)
[facility]  Patient: Laura [name] / Admit Date: 04/08/2017 / Date of Encounter: 04/10/2017, 8:55 AM   Subjective: Patient is feeling much better today. Less hypoxia and less weakness and fatigue. Atrial fibrillation much better controlled heart rate due to above. Patient appears to have had some difficulty with appropriate medication management for which apparently carvedilol was at lower dose and she was not appropriately taking her Lasix at the time.  Review of Systems: Positive for: Shortness of breath weakness Negative for: Vision change, hearing change, syncope, dizziness, nausea, vomiting,diarrhea, bloody stool, stomach pain, cough, congestion, diaphoresis, urinary frequency, urinary pain,skin lesions, skin rashes Others previously listed  Objective: Telemetry: Atrial fibrillation with controlled ventricular rate Physical Exam: Blood pressure 126/82, pulse (!) 112, temperature 98.2 F (36.8 C), temperature source Oral, resp. rate 20, height 4\' 9"  (1.448 m), weight 55.2 kg (121 lb 12.8 oz), SpO2 96 %. Body mass index is 26.36 kg/m. General: Well developed, well nourished, in no acute distress. Head: Normocephalic, atraumatic, sclera non-icteric, no xanthomas, nares are without discharge. Neck: No apparent masses Lungs: Normal respirations with few wheezes, no rhonchi, no rales , basilar crackles   Heart: Regular rate and rhythm, normal S1 S2, no murmur, no rub, no gallop, PMI is normal size and placement, carotid upstroke normal without bruit, jugular venous pressure normal Abdomen: Soft, non-tender, non-distended with normoactive bowel sounds. No hepatosplenomegaly. Abdominal aorta is normal size without bruit Extremities: Trace edema, no clubbing, no cyanosis, no ulcers,  Peripheral: 2+ radial, 2+ femoral, 2+ dorsal pedal pulses Neuro: Alert and oriented. Moves all extremities spontaneously. Psych:  Responds to questions appropriately with a  normal affect.   Intake/Output Summary (Last 24 hours) at 04/10/17 0855 Last data filed at 04/10/17 0331  Gross per 24 hour  Intake              480 ml  Output             2550 ml  Net            -2070 ml    Inpatient Medications:  . acetaminophen  1,000 mg Oral Daily  . aspirin EC  81 mg Oral Daily  . carvedilol  25 mg Oral BID WC  . diltiazem  240 mg Oral Daily  . enoxaparin (LOVENOX) injection  30 mg Subcutaneous Q24H  . ferrous gluconate  240 mg Oral BID WC  . fluticasone  2 spray Each Nare Daily  . furosemide  40 mg Oral BID  . guaiFENesin  600 mg Oral BID  . hydrALAZINE  100 mg Oral BID  . isosorbide mononitrate  120 mg Oral Daily  . lisinopril  10 mg Oral Daily  . methylPREDNISolone (SOLU-MEDROL) injection  60 mg Intravenous STAT  . metolazone  2.5 mg Oral Daily  . mometasone-formoterol  2 puff Inhalation BID  . multivitamin-lutein  1 capsule Oral Daily  . pantoprazole  40 mg Oral Daily  . potassium chloride  20 mEq Oral Daily  . simvastatin  40 mg Oral q1800  . sodium chloride flush  3 mL Intravenous Q12H   Infusions:  . sodium chloride      Labs:  Recent Labs  04/09/17 0425 04/10/17 0419  NA 131* 131*  K 2.9* 4.7  CL 95* 97*  CO2 26 26  GLUCOSE 89 89  BUN 46* 51*  CREATININE 2.03* 2.15*  CALCIUM 8.5* 8.7*   No results for input(s): AST, ALT, ALKPHOS, BILITOT, PROT, ALBUMIN in  the last 72 hours.  Recent Labs  04/08/17 1234  WBC 4.7  HGB 10.6*  HCT 31.9*  MCV 92.1  PLT 233    Recent Labs  04/08/17 1234  TROPONINI 0.03*   Invalid input(s): POCBNP No results for input(s): HGBA1C in the last 72 hours.   Weights: Filed Weights   04/08/17 1705 04/09/17 0530 04/10/17 0331  Weight: 57 kg (125 lb 11.2 oz) 56.9 kg (125 lb 7.7 oz) 55.2 kg (121 lb 12.8 oz)     Radiology/Studies:  Dg Chest 2 View  Result Date: 04/10/2017 CLINICAL DATA:  83 year old female with history of low sodium, weakness and confusion. EXAM: CHEST  2 VIEW COMPARISON:   Chest x-ray 04/08/2017. FINDINGS: Focal mass-like consolidative opacity in the medial aspect of the right lower lobe concerning for pneumonia. Small bilateral pleural effusions. No evidence of pulmonary edema. Heart size is mildly enlarged. The patient is rotated to the right on today's exam, resulting in distortion of the mediastinal contours and reduced diagnostic sensitivity and specificity for mediastinal pathology. Aortic atherosclerosis. IMPRESSION: 1. Mass-like area of consolidation in the medial aspect of the right lower lobe concerning for pneumonia. Followup PA and lateral chest X-ray is recommended in 3-4 weeks following trial of antibiotic therapy to ensure resolution and exclude underlying malignancy. 2. Small bilateral pleural effusions. 3. Mild cardiomegaly. 4. Aortic atherosclerosis. Electronically Signed   By: Trudie Reed M.D.   On: 04/10/2017 07:50   Dg Chest 2 View  Result Date: 03/17/2017 CLINICAL DATA:  Congestive heart failure.  Low sodium. EXAM: CHEST  2 VIEW COMPARISON:  02/22/2017 FINDINGS: Chronic cardiomegaly. Chronic aortic atherosclerosis with calcification and tortuosity. Small bilateral pleural effusions. Mild basilar atelectasis. Pulmonary venous hypertension without frank edema. IMPRESSION: Cardiomegaly.  Small effusions.  Basilar atelectasis. Electronically Signed   By: Paulina Fusi M.D.   On: 03/17/2017 18:34   Dg Chest Portable 1 View  Result Date: 04/08/2017 CLINICAL DATA:  Was recently admitted for CHF then went to rehab. Has been having dyspnea and was in afib rvr at [facility]. accessory muscle use, tachypnea and labored int triage. Has had swelling to legs. EXAM: PORTABLE CHEST 1 VIEW COMPARISON:  03/17/2017 FINDINGS: Cardiac silhouette is mildly enlarged, stable from prior exam. No mediastinal or hilar masses. There are thickened bronchovascular markings bilaterally. Thickening is noted along the minor fissure. There is evidence of small pleural effusions. No evidence  of pneumonia. No pneumothorax. Bony thorax is demineralized but grossly intact. Findings are stable when compared to the prior study. IMPRESSION: 1. No convincing acute cardiopulmonary disease. No overt pulmonary edema or evidence of pneumonia. 2. Cardiomegaly with small pleural effusions similar to the prior chest radiograph. Electronically Signed   By: Amie Portland M.D.   On: 04/08/2017 13:02     Assessment and Recommendation  83 y.o. female with the acute on chronic systolic dysfunction congestive heart failure exacerbated likely secondary to mixup with medication management with essential hypertension chronic kidney disease stage IV and elevated troponin consistent with demand ischemia rather than acute coronary syndrome 1. Continue furosemide changing over to oral medication management at this time 2. Continue diltiazem carvedilol combination for heart rate control of atrial fibrillation improved 3. Hydralazine for hypertension control in addition to above 4. No further cardiac diagnostics necessary at this time 5. Begin ambulation today and follow for improvements of symptoms and possible discharge to home today with follow-up later this week or early next week for further adjustments of medication management  Signed, Arnoldo Hooker  M.D. FACC

## 2017-04-10 NOTE — Progress Notes (Signed)
Pt to be discharged this afternoon with home health. Iv and tele removed. disch instructions gone over with pt's son. Portable dnr form accompanied. Awaiting transport.

## 2017-04-11 ENCOUNTER — Other Ambulatory Visit: Payer: Self-pay | Admitting: *Deleted

## 2017-04-11 DIAGNOSIS — I4891 Unspecified atrial fibrillation: Secondary | ICD-10-CM | POA: Diagnosis not present

## 2017-04-11 DIAGNOSIS — N184 Chronic kidney disease, stage 4 (severe): Secondary | ICD-10-CM | POA: Diagnosis not present

## 2017-04-11 DIAGNOSIS — D631 Anemia in chronic kidney disease: Secondary | ICD-10-CM | POA: Diagnosis not present

## 2017-04-11 DIAGNOSIS — I130 Hypertensive heart and chronic kidney disease with heart failure and stage 1 through stage 4 chronic kidney disease, or unspecified chronic kidney disease: Secondary | ICD-10-CM | POA: Diagnosis not present

## 2017-04-11 DIAGNOSIS — E7800 Pure hypercholesterolemia, unspecified: Secondary | ICD-10-CM | POA: Diagnosis not present

## 2017-04-11 DIAGNOSIS — I5020 Unspecified systolic (congestive) heart failure: Secondary | ICD-10-CM | POA: Diagnosis not present

## 2017-04-11 NOTE — Patient Outreach (Signed)
Transition of care call successful, follow up on recent hospitalization 5/19-5/21 for Atrial fib, Acute on chronic HF.  3 admits in 6 months with previous in patient stay 4/27-5/1 for hyponatremia, Weakness then discharged to SNF for rehab.  Spoke with pt, HIPAA verified, discussed purpose of call - follow up post discharge.  Pt reports feeling better today, still have a lot of strength to gain, son currently with her fixes my medications, other children take turns staying with her.  Pt reports weighing every day, today 117 lbs, lost a lot of fluid in the hospital.  Pt reports no edema,no sob, has to have a pillow propped up when in bed. Pt reports HH PT came today.   Pt  [facility](HOH) gave  permission for son to speak to RN CM - review discharge medications.   Son Loraine LericheMark reports he fixed pt's pill planner per recent discharge papers- Patient was recently discharged from hospital and all medications have been reviewed.   Son requested RN CM with next follow up call to make it when sitter is there as pt does not always hear the phone.    Plan:  As discussed with son to inform pt that RN CM to follow up again next week telephonically (part of ongoing transition of care).   Shayne Alkenose M.   Anibal Quinby RN CCM [facility] Care Management  [phone]

## 2017-04-12 DIAGNOSIS — I130 Hypertensive heart and chronic kidney disease with heart failure and stage 1 through stage 4 chronic kidney disease, or unspecified chronic kidney disease: Secondary | ICD-10-CM | POA: Diagnosis not present

## 2017-04-12 DIAGNOSIS — N184 Chronic kidney disease, stage 4 (severe): Secondary | ICD-10-CM | POA: Diagnosis not present

## 2017-04-12 DIAGNOSIS — E7800 Pure hypercholesterolemia, unspecified: Secondary | ICD-10-CM | POA: Diagnosis not present

## 2017-04-12 DIAGNOSIS — I4891 Unspecified atrial fibrillation: Secondary | ICD-10-CM | POA: Diagnosis not present

## 2017-04-12 DIAGNOSIS — I5020 Unspecified systolic (congestive) heart failure: Secondary | ICD-10-CM | POA: Diagnosis not present

## 2017-04-12 DIAGNOSIS — D631 Anemia in chronic kidney disease: Secondary | ICD-10-CM | POA: Diagnosis not present

## 2017-04-13 DIAGNOSIS — I5033 Acute on chronic diastolic (congestive) heart failure: Secondary | ICD-10-CM | POA: Diagnosis not present

## 2017-04-13 DIAGNOSIS — I480 Paroxysmal atrial fibrillation: Secondary | ICD-10-CM | POA: Diagnosis not present

## 2017-04-13 DIAGNOSIS — E7800 Pure hypercholesterolemia, unspecified: Secondary | ICD-10-CM | POA: Diagnosis not present

## 2017-04-13 DIAGNOSIS — J159 Unspecified bacterial pneumonia: Secondary | ICD-10-CM | POA: Diagnosis not present

## 2017-04-14 DIAGNOSIS — I130 Hypertensive heart and chronic kidney disease with heart failure and stage 1 through stage 4 chronic kidney disease, or unspecified chronic kidney disease: Secondary | ICD-10-CM | POA: Diagnosis not present

## 2017-04-14 DIAGNOSIS — D631 Anemia in chronic kidney disease: Secondary | ICD-10-CM | POA: Diagnosis not present

## 2017-04-14 DIAGNOSIS — E7800 Pure hypercholesterolemia, unspecified: Secondary | ICD-10-CM | POA: Diagnosis not present

## 2017-04-14 DIAGNOSIS — I5020 Unspecified systolic (congestive) heart failure: Secondary | ICD-10-CM | POA: Diagnosis not present

## 2017-04-14 DIAGNOSIS — I4891 Unspecified atrial fibrillation: Secondary | ICD-10-CM | POA: Diagnosis not present

## 2017-04-14 DIAGNOSIS — N184 Chronic kidney disease, stage 4 (severe): Secondary | ICD-10-CM | POA: Diagnosis not present

## 2017-04-14 NOTE — Care Management (Addendum)
Was notified that patient's son is not happy with home health services being provided by [facility] and wants services stopped immediatley and find another agency.  Left message for liaison and spoke with Clinical manager.  Assured patient would be "closed out" today so another agency can make start of care visit tomorrow.  Agency preference is [facility].  Agency will make all effort to open case within 48 hours.  Informed [facility] per daughter Lucindy- not to call the patient to set up visits- need to call Lucindy

## 2017-04-18 DIAGNOSIS — J189 Pneumonia, unspecified organism: Secondary | ICD-10-CM | POA: Diagnosis not present

## 2017-04-18 DIAGNOSIS — K219 Gastro-esophageal reflux disease without esophagitis: Secondary | ICD-10-CM | POA: Diagnosis not present

## 2017-04-18 DIAGNOSIS — H3530 Unspecified macular degeneration: Secondary | ICD-10-CM | POA: Diagnosis not present

## 2017-04-18 DIAGNOSIS — I5033 Acute on chronic diastolic (congestive) heart failure: Secondary | ICD-10-CM | POA: Diagnosis not present

## 2017-04-18 DIAGNOSIS — I4891 Unspecified atrial fibrillation: Secondary | ICD-10-CM | POA: Diagnosis not present

## 2017-04-18 DIAGNOSIS — N189 Chronic kidney disease, unspecified: Secondary | ICD-10-CM | POA: Diagnosis not present

## 2017-04-18 DIAGNOSIS — I130 Hypertensive heart and chronic kidney disease with heart failure and stage 1 through stage 4 chronic kidney disease, or unspecified chronic kidney disease: Secondary | ICD-10-CM | POA: Diagnosis not present

## 2017-04-18 DIAGNOSIS — J45909 Unspecified asthma, uncomplicated: Secondary | ICD-10-CM | POA: Diagnosis not present

## 2017-04-18 DIAGNOSIS — E785 Hyperlipidemia, unspecified: Secondary | ICD-10-CM | POA: Diagnosis not present

## 2017-04-19 ENCOUNTER — Encounter: Payer: Self-pay | Admitting: Family

## 2017-04-19 ENCOUNTER — Ambulatory Visit: Payer: Medicare Other | Attending: Family | Admitting: Family

## 2017-04-19 VITALS — BP 108/47 | HR 83 | Resp 20 | Ht <= 58 in | Wt 122.2 lb

## 2017-04-19 DIAGNOSIS — I480 Paroxysmal atrial fibrillation: Secondary | ICD-10-CM

## 2017-04-19 DIAGNOSIS — I509 Heart failure, unspecified: Secondary | ICD-10-CM | POA: Diagnosis present

## 2017-04-19 DIAGNOSIS — Z7982 Long term (current) use of aspirin: Secondary | ICD-10-CM | POA: Insufficient documentation

## 2017-04-19 DIAGNOSIS — I130 Hypertensive heart and chronic kidney disease with heart failure and stage 1 through stage 4 chronic kidney disease, or unspecified chronic kidney disease: Secondary | ICD-10-CM | POA: Diagnosis not present

## 2017-04-19 DIAGNOSIS — E7800 Pure hypercholesterolemia, unspecified: Secondary | ICD-10-CM | POA: Diagnosis not present

## 2017-04-19 DIAGNOSIS — Z882 Allergy status to sulfonamides status: Secondary | ICD-10-CM | POA: Diagnosis not present

## 2017-04-19 DIAGNOSIS — I4891 Unspecified atrial fibrillation: Secondary | ICD-10-CM | POA: Insufficient documentation

## 2017-04-19 DIAGNOSIS — Z8249 Family history of ischemic heart disease and other diseases of the circulatory system: Secondary | ICD-10-CM | POA: Insufficient documentation

## 2017-04-19 DIAGNOSIS — I10 Essential (primary) hypertension: Secondary | ICD-10-CM

## 2017-04-19 DIAGNOSIS — Z880 Allergy status to penicillin: Secondary | ICD-10-CM | POA: Diagnosis not present

## 2017-04-19 DIAGNOSIS — Z800 Family history of malignant neoplasm of digestive organs: Secondary | ICD-10-CM | POA: Insufficient documentation

## 2017-04-19 DIAGNOSIS — Z79899 Other long term (current) drug therapy: Secondary | ICD-10-CM | POA: Diagnosis not present

## 2017-04-19 DIAGNOSIS — Z7951 Long term (current) use of inhaled steroids: Secondary | ICD-10-CM | POA: Insufficient documentation

## 2017-04-19 DIAGNOSIS — Z888 Allergy status to other drugs, medicaments and biological substances status: Secondary | ICD-10-CM | POA: Insufficient documentation

## 2017-04-19 DIAGNOSIS — E871 Hypo-osmolality and hyponatremia: Secondary | ICD-10-CM | POA: Insufficient documentation

## 2017-04-19 DIAGNOSIS — H3530 Unspecified macular degeneration: Secondary | ICD-10-CM | POA: Insufficient documentation

## 2017-04-19 DIAGNOSIS — Z833 Family history of diabetes mellitus: Secondary | ICD-10-CM | POA: Diagnosis not present

## 2017-04-19 DIAGNOSIS — I5032 Chronic diastolic (congestive) heart failure: Secondary | ICD-10-CM | POA: Diagnosis not present

## 2017-04-19 DIAGNOSIS — N189 Chronic kidney disease, unspecified: Secondary | ICD-10-CM | POA: Insufficient documentation

## 2017-04-19 DIAGNOSIS — K219 Gastro-esophageal reflux disease without esophagitis: Secondary | ICD-10-CM | POA: Insufficient documentation

## 2017-04-19 DIAGNOSIS — Z96653 Presence of artificial knee joint, bilateral: Secondary | ICD-10-CM | POA: Insufficient documentation

## 2017-04-19 NOTE — Patient Instructions (Signed)
Continue weighing daily and call for an overnight weight gain of > 2 pounds or a weekly weight gain of >5 pounds. 

## 2017-04-19 NOTE — Progress Notes (Signed)
Patient ID: Laura [name], female    DOB: 1924/07/03, 83 y.o.   MRN: [id number]  HPI [name] is a 83 y/o female with a history of macular degeneration, anemia, hyponatremia, SVT, GERD, GI bleed, paroxymal atrial fibrillation, CKD, hyperlipidemia, HTN and chronic heart failure.  Reviewed echo report from 04/09/17 which showed an EF of 60-65%. Previous echo was done 02/20/17 and showed mild/mod MR. No EF reported but cardiologist said it was systolic in nature.   Admitted 04/08/17 due to HF exacerbation. Initially given IV diuretics. Cardiology consult obtained. Given antibiotics and prednisone due to pneumonia. Discharged after 2 days. Admitted 03/17/17 due to hyponatremia and confusion. Treated and discharged after 4 days. Admitted 02/19/17 with HF exacerbation and atrial fibrillation. Cardiology consult obtained and medications were adjusted. Hyponatremia prompted fluid restriction of 1200 cc free water daily. Was in the ED 01/09/17 for SVT. Treated and released.   She presents today for a follow-up visit with a chief complaint of mild shortness of breath with moderate exertion. She says that this has been present for months and occurs when she has to walk long distances especially when she's walking outside. Resolves quickly at rest. Denies any associated symptoms such as fatigue, pedal edema or weight gain.   Past Medical History:  Diagnosis Date  . Anemia   . Arrhythmia    atrial fibrillation  . Atrial fibrillation (HCC)   . CHF (congestive heart failure) (HCC)   . Chronic kidney disease   . GERD (gastroesophageal reflux disease)   . GI bleed   . High cholesterol   . Hypertension   . Hyponatremia   . Macular degeneration   . SVT (supraventricular tachycardia) (HCC)    Past Surgical History:  Procedure Laterality Date  . ABDOMINAL HYSTERECTOMY    . JOINT REPLACEMENT    . REPLACEMENT TOTAL KNEE BILATERAL    . VAGINAL HYSTERECTOMY     Partial   Family History  Problem Relation  Age of Onset  . Hypertension Mother   . CAD Father   . Heart disease Father   . Arthritis Father   . Pancreatic cancer Son   . Heart failure Sister   . Diabetes Brother   . Heart failure Sister   . Diabetes Son    Social History  Substance Use Topics  . Smoking status: Never Smoker  . Smokeless tobacco: Never Used  . Alcohol use No   Allergies  Allergen Reactions  . Penicillins Shortness Of Breath        . Codeine Itching  . Amlodipine Rash    Peripheral edema  . Ciprofloxacin Rash  . Sulfa Antibiotics Rash   Prior to Admission medications   Medication Sig Start Date End Date Taking? Authorizing Provider  acetaminophen (TYLENOL) 500 MG tablet Take 1,000 mg by mouth daily. Take 2 tabs (1000mg ) by mouth every morning.  Take 2 tablets in evening if needed.   Yes Historical Provider, MD  aspirin EC 81 MG tablet Take 81 mg by mouth daily.   Yes Historical Provider, MD  carvedilol (COREG) 25 MG tablet Take 1 tablet (25 mg total) by mouth 2 (two) times daily with a meal. 02/27/17  Yes Sital Mody, MD  diltiazem (CARDIZEM CD) 240 MG 24 hr capsule Take 1 capsule (240 mg total) by mouth daily. 02/28/17  Yes Adrian Saran, MD  docusate sodium (COLACE) 100 MG capsule Take 100 mg by mouth 2 (two) times daily. 01/02/17  Yes Historical Provider, MD  Fe Fum-Vit  C-Vit B12-FA (TRIGELS-F FORTE) 460-60-0.01-1 MG CAPS capsule Take 1 tablet by mouth daily.   Yes Historical Provider, MD  Ferrous Gluconate 324 (37.5 Fe) MG TABS Take 1 tablet by mouth 2 (two) times daily. 11/15/16  Yes Historical Provider, MD  fluticasone (FLONASE) 50 MCG/ACT nasal spray Place 2 sprays into both nostrils daily.   Yes Historical Provider, MD  hydrALAZINE (APRESOLINE) 100 MG tablet Take 100 mg by mouth 2 (two) times daily. 12/23/16  Yes Historical Provider, MD  isosorbide mononitrate (IMDUR) 120 MG 24 hr tablet Take 1 tablet by mouth daily. 12/23/16  Yes Historical Provider, MD  lisinopril (PRINIVIL,ZESTRIL) 10 MG tablet Take 10  mg by mouth daily. 11/15/16  Yes Historical Provider, MD  Lutein 20 MG TABS Take 1 tablet by mouth daily.   Yes Historical Provider, MD  pantoprazole (PROTONIX) 40 MG tablet Take 40 mg by mouth daily. 12/23/16  Yes Historical Provider, MD  simvastatin (ZOCOR) 40 MG tablet Take 40 mg by mouth daily. 12/23/16  Yes Historical Provider, MD  torsemide (DEMADEX) 20 MG tablet Take 20 mg by mouth daily.    Yes Historical Provider, MD  traMADol (ULTRAM) 50 MG tablet Take 50 mg by mouth every 6 (six) hours as needed for pain.   Yes Historical Provider, MD   Review of Systems  Constitutional: Negative for appetite change and fatigue.  HENT: Negative for congestion, postnasal drip and sore throat.   Eyes: Positive for visual disturbance. Negative for pain.  Respiratory: Positive for shortness of breath. Negative for cough and chest tightness.   Cardiovascular: Negative for chest pain, palpitations and leg swelling.  Gastrointestinal: Negative for abdominal distention and abdominal pain.  Endocrine: Negative.   Genitourinary: Negative.   Musculoskeletal: Negative for arthralgias and back pain.  Skin: Negative.   Allergic/Immunologic: Negative.   Neurological: Negative for dizziness and light-headedness.  Hematological: Negative for adenopathy. Does not bruise/bleed easily.  Psychiatric/Behavioral: Negative for dysphoric mood and sleep disturbance (sleeping on 2 pillows). The patient is not nervous/anxious.    Vitals:   04/19/17 1104  BP: (!) 108/47  Pulse: 83  Resp: 20  SpO2: 98%  Weight: 122 lb 4 oz (55.5 kg)  Height: 4\' 9"  (1.448 m)   Wt Readings from Last 3 Encounters:  04/19/17 122 lb 4 oz (55.5 kg)  04/10/17 121 lb 12.8 oz (55.2 kg)  03/20/17 130 lb 8 oz (59.2 kg)   Lab Results  Component Value Date   CREATININE 2.15 (H) 04/10/2017   CREATININE 2.03 (H) 04/09/2017   CREATININE 1.91 (H) 04/08/2017   Physical Exam  Constitutional: She is oriented to person, place, and time. She appears  well-developed and well-nourished.  HENT:  Head: Normocephalic and atraumatic.  Eyes: Conjunctivae are normal.  Neck: Normal range of motion. Neck supple. No JVD present.  Cardiovascular: Normal rate.  An irregular rhythm present.  Pulmonary/Chest: Effort normal. She has no wheezes. She has no rales.  Abdominal: Soft. She exhibits no distension. There is no tenderness.  Musculoskeletal: She exhibits no edema or tenderness.  Neurological: She is alert and oriented to person, place, and time.  Skin: Skin is warm and dry.  Psychiatric: She has a normal mood and affect. Her behavior is normal.  Nursing note and vitals reviewed.    Assessment & Plan:  1: Chronic heart failure with preserved ejection fraction- - NYHA class II - euvolemic today - weighing most days and says that her weight has been stable. Did have one time of an overnight  weight gain of >2 pounds and she was given an extra torsemide with reduction in weight the following day. Reminded to call for an overnight weight gain of >2 pounds or a weekly weight gain of >5 pounds.  - not adding extra salt. Does live at [facility] and eats some meals there but she says that the food doesn't taste salty - saw cardiologist Juliann Pares) 03/02/17  2: Atrial fibrillation- - taking aspirin, carvedilol, diltiazem at this time - no other anticoagulation at this time due to history of GI bleed along with age  85: HTN- - BP looks good today -continue hydralazine, lisinopril and torsemide - saw PCP Cordelia Poche) 04/13/17  4: Hyponatremia- -BMP done 03/17/17 reviewed and shows a sodium level of 124, potassium 4.5 and GFR 28. -continue 1200 cc free water fluid restriction  Patient did not bring her medications nor a list. Each medication was verbally reviewed with the patient and she was encouraged to bring the bottles to every visit to confirm accuracy of list.  Return here in 6 months or sooner for any questions/problems before then.

## 2017-04-20 DIAGNOSIS — J45909 Unspecified asthma, uncomplicated: Secondary | ICD-10-CM | POA: Diagnosis not present

## 2017-04-20 DIAGNOSIS — I130 Hypertensive heart and chronic kidney disease with heart failure and stage 1 through stage 4 chronic kidney disease, or unspecified chronic kidney disease: Secondary | ICD-10-CM | POA: Diagnosis not present

## 2017-04-20 DIAGNOSIS — I4891 Unspecified atrial fibrillation: Secondary | ICD-10-CM | POA: Diagnosis not present

## 2017-04-20 DIAGNOSIS — I5033 Acute on chronic diastolic (congestive) heart failure: Secondary | ICD-10-CM | POA: Diagnosis not present

## 2017-04-20 DIAGNOSIS — N189 Chronic kidney disease, unspecified: Secondary | ICD-10-CM | POA: Diagnosis not present

## 2017-04-20 DIAGNOSIS — J189 Pneumonia, unspecified organism: Secondary | ICD-10-CM | POA: Diagnosis not present

## 2017-04-21 ENCOUNTER — Other Ambulatory Visit: Payer: Self-pay | Admitting: *Deleted

## 2017-04-21 DIAGNOSIS — J189 Pneumonia, unspecified organism: Secondary | ICD-10-CM | POA: Diagnosis not present

## 2017-04-21 DIAGNOSIS — I5033 Acute on chronic diastolic (congestive) heart failure: Secondary | ICD-10-CM | POA: Diagnosis not present

## 2017-04-21 DIAGNOSIS — I130 Hypertensive heart and chronic kidney disease with heart failure and stage 1 through stage 4 chronic kidney disease, or unspecified chronic kidney disease: Secondary | ICD-10-CM | POA: Diagnosis not present

## 2017-04-21 DIAGNOSIS — J45909 Unspecified asthma, uncomplicated: Secondary | ICD-10-CM | POA: Diagnosis not present

## 2017-04-21 DIAGNOSIS — N189 Chronic kidney disease, unspecified: Secondary | ICD-10-CM | POA: Diagnosis not present

## 2017-04-21 DIAGNOSIS — I4891 Unspecified atrial fibrillation: Secondary | ICD-10-CM | POA: Diagnosis not present

## 2017-04-21 NOTE — Patient Outreach (Signed)
Attempt made to contact pt as part of ongoing transition of care- recent hospitalization 5/19-5/21 for Atrial Fib, acute on chronic congestive heart failure.    HIPAA compliant voice message left with contact name and number.   Plan:  If no response to voice message left, plan to follow up again next week telephonically (part of ongoing transition of care).   Shayne Alkenose M.   Aizen Duval RN CCM [facility]  [phone]

## 2017-04-24 DIAGNOSIS — N184 Chronic kidney disease, stage 4 (severe): Secondary | ICD-10-CM | POA: Diagnosis not present

## 2017-04-24 DIAGNOSIS — I129 Hypertensive chronic kidney disease with stage 1 through stage 4 chronic kidney disease, or unspecified chronic kidney disease: Secondary | ICD-10-CM | POA: Diagnosis not present

## 2017-04-24 DIAGNOSIS — E871 Hypo-osmolality and hyponatremia: Secondary | ICD-10-CM | POA: Diagnosis not present

## 2017-04-24 DIAGNOSIS — D631 Anemia in chronic kidney disease: Secondary | ICD-10-CM | POA: Diagnosis not present

## 2017-04-25 DIAGNOSIS — J45909 Unspecified asthma, uncomplicated: Secondary | ICD-10-CM | POA: Diagnosis not present

## 2017-04-25 DIAGNOSIS — I4891 Unspecified atrial fibrillation: Secondary | ICD-10-CM | POA: Diagnosis not present

## 2017-04-25 DIAGNOSIS — N189 Chronic kidney disease, unspecified: Secondary | ICD-10-CM | POA: Diagnosis not present

## 2017-04-25 DIAGNOSIS — I130 Hypertensive heart and chronic kidney disease with heart failure and stage 1 through stage 4 chronic kidney disease, or unspecified chronic kidney disease: Secondary | ICD-10-CM | POA: Diagnosis not present

## 2017-04-25 DIAGNOSIS — J189 Pneumonia, unspecified organism: Secondary | ICD-10-CM | POA: Diagnosis not present

## 2017-04-25 DIAGNOSIS — I5033 Acute on chronic diastolic (congestive) heart failure: Secondary | ICD-10-CM | POA: Diagnosis not present

## 2017-04-26 ENCOUNTER — Other Ambulatory Visit: Payer: Self-pay | Admitting: *Deleted

## 2017-04-26 DIAGNOSIS — I4891 Unspecified atrial fibrillation: Secondary | ICD-10-CM | POA: Diagnosis not present

## 2017-04-26 DIAGNOSIS — J45909 Unspecified asthma, uncomplicated: Secondary | ICD-10-CM | POA: Diagnosis not present

## 2017-04-26 DIAGNOSIS — N189 Chronic kidney disease, unspecified: Secondary | ICD-10-CM | POA: Diagnosis not present

## 2017-04-26 DIAGNOSIS — I130 Hypertensive heart and chronic kidney disease with heart failure and stage 1 through stage 4 chronic kidney disease, or unspecified chronic kidney disease: Secondary | ICD-10-CM | POA: Diagnosis not present

## 2017-04-26 DIAGNOSIS — H6123 Impacted cerumen, bilateral: Secondary | ICD-10-CM | POA: Diagnosis not present

## 2017-04-26 DIAGNOSIS — J189 Pneumonia, unspecified organism: Secondary | ICD-10-CM | POA: Diagnosis not present

## 2017-04-26 DIAGNOSIS — I5033 Acute on chronic diastolic (congestive) heart failure: Secondary | ICD-10-CM | POA: Diagnosis not present

## 2017-04-26 DIAGNOSIS — H903 Sensorineural hearing loss, bilateral: Secondary | ICD-10-CM | POA: Diagnosis not present

## 2017-04-26 NOTE — Patient Outreach (Signed)
Another attempt made to contact pt, part of ongoing transition of care- recent hospitalization 5/9-5/21 for Atrial Fib, acute on chronic congestive heart failure.  HIPAA compliant voice message left with contact name and number.    Plan:  If no response, plan to follow up again next week telephonically.    Shayne Alkenose M.   Khylei Wilms RN CCM [facility]  [phone]

## 2017-04-27 DIAGNOSIS — I130 Hypertensive heart and chronic kidney disease with heart failure and stage 1 through stage 4 chronic kidney disease, or unspecified chronic kidney disease: Secondary | ICD-10-CM | POA: Diagnosis not present

## 2017-04-27 DIAGNOSIS — J45909 Unspecified asthma, uncomplicated: Secondary | ICD-10-CM | POA: Diagnosis not present

## 2017-04-27 DIAGNOSIS — J189 Pneumonia, unspecified organism: Secondary | ICD-10-CM | POA: Diagnosis not present

## 2017-04-27 DIAGNOSIS — I5033 Acute on chronic diastolic (congestive) heart failure: Secondary | ICD-10-CM | POA: Diagnosis not present

## 2017-04-27 DIAGNOSIS — N189 Chronic kidney disease, unspecified: Secondary | ICD-10-CM | POA: Diagnosis not present

## 2017-04-27 DIAGNOSIS — I4891 Unspecified atrial fibrillation: Secondary | ICD-10-CM | POA: Diagnosis not present

## 2017-04-28 DIAGNOSIS — I5033 Acute on chronic diastolic (congestive) heart failure: Secondary | ICD-10-CM | POA: Diagnosis not present

## 2017-04-28 DIAGNOSIS — J45909 Unspecified asthma, uncomplicated: Secondary | ICD-10-CM | POA: Diagnosis not present

## 2017-04-28 DIAGNOSIS — I4891 Unspecified atrial fibrillation: Secondary | ICD-10-CM | POA: Diagnosis not present

## 2017-04-28 DIAGNOSIS — N189 Chronic kidney disease, unspecified: Secondary | ICD-10-CM | POA: Diagnosis not present

## 2017-04-28 DIAGNOSIS — I130 Hypertensive heart and chronic kidney disease with heart failure and stage 1 through stage 4 chronic kidney disease, or unspecified chronic kidney disease: Secondary | ICD-10-CM | POA: Diagnosis not present

## 2017-04-28 DIAGNOSIS — J189 Pneumonia, unspecified organism: Secondary | ICD-10-CM | POA: Diagnosis not present

## 2017-04-29 DIAGNOSIS — R05 Cough: Secondary | ICD-10-CM | POA: Diagnosis not present

## 2017-05-01 DIAGNOSIS — I130 Hypertensive heart and chronic kidney disease with heart failure and stage 1 through stage 4 chronic kidney disease, or unspecified chronic kidney disease: Secondary | ICD-10-CM | POA: Diagnosis not present

## 2017-05-01 DIAGNOSIS — N189 Chronic kidney disease, unspecified: Secondary | ICD-10-CM | POA: Diagnosis not present

## 2017-05-01 DIAGNOSIS — I4891 Unspecified atrial fibrillation: Secondary | ICD-10-CM | POA: Diagnosis not present

## 2017-05-01 DIAGNOSIS — J45909 Unspecified asthma, uncomplicated: Secondary | ICD-10-CM | POA: Diagnosis not present

## 2017-05-01 DIAGNOSIS — I5033 Acute on chronic diastolic (congestive) heart failure: Secondary | ICD-10-CM | POA: Diagnosis not present

## 2017-05-01 DIAGNOSIS — J189 Pneumonia, unspecified organism: Secondary | ICD-10-CM | POA: Diagnosis not present

## 2017-05-02 DIAGNOSIS — I5033 Acute on chronic diastolic (congestive) heart failure: Secondary | ICD-10-CM | POA: Diagnosis not present

## 2017-05-02 DIAGNOSIS — I4891 Unspecified atrial fibrillation: Secondary | ICD-10-CM | POA: Diagnosis not present

## 2017-05-02 DIAGNOSIS — J189 Pneumonia, unspecified organism: Secondary | ICD-10-CM | POA: Diagnosis not present

## 2017-05-02 DIAGNOSIS — I130 Hypertensive heart and chronic kidney disease with heart failure and stage 1 through stage 4 chronic kidney disease, or unspecified chronic kidney disease: Secondary | ICD-10-CM | POA: Diagnosis not present

## 2017-05-02 DIAGNOSIS — N189 Chronic kidney disease, unspecified: Secondary | ICD-10-CM | POA: Diagnosis not present

## 2017-05-02 DIAGNOSIS — J45909 Unspecified asthma, uncomplicated: Secondary | ICD-10-CM | POA: Diagnosis not present

## 2017-05-03 DIAGNOSIS — I130 Hypertensive heart and chronic kidney disease with heart failure and stage 1 through stage 4 chronic kidney disease, or unspecified chronic kidney disease: Secondary | ICD-10-CM | POA: Diagnosis not present

## 2017-05-03 DIAGNOSIS — J45909 Unspecified asthma, uncomplicated: Secondary | ICD-10-CM | POA: Diagnosis not present

## 2017-05-03 DIAGNOSIS — N189 Chronic kidney disease, unspecified: Secondary | ICD-10-CM | POA: Diagnosis not present

## 2017-05-03 DIAGNOSIS — I5033 Acute on chronic diastolic (congestive) heart failure: Secondary | ICD-10-CM | POA: Diagnosis not present

## 2017-05-03 DIAGNOSIS — J189 Pneumonia, unspecified organism: Secondary | ICD-10-CM | POA: Diagnosis not present

## 2017-05-03 DIAGNOSIS — I4891 Unspecified atrial fibrillation: Secondary | ICD-10-CM | POA: Diagnosis not present

## 2017-05-04 ENCOUNTER — Encounter: Payer: Self-pay | Admitting: Emergency Medicine

## 2017-05-04 ENCOUNTER — Emergency Department: Payer: Medicare Other

## 2017-05-04 ENCOUNTER — Other Ambulatory Visit: Payer: Self-pay | Admitting: *Deleted

## 2017-05-04 ENCOUNTER — Inpatient Hospital Stay
Admission: EM | Admit: 2017-05-04 | Discharge: 2017-05-10 | DRG: 291 | Disposition: A | Discharge status: Home / Self Care | Payer: Medicare Other | Attending: Specialist | Admitting: Specialist

## 2017-05-04 ENCOUNTER — Telehealth: Payer: Self-pay

## 2017-05-04 DIAGNOSIS — H3530 Unspecified macular degeneration: Secondary | ICD-10-CM | POA: Diagnosis present

## 2017-05-04 DIAGNOSIS — E222 Syndrome of inappropriate secretion of antidiuretic hormone: Secondary | ICD-10-CM | POA: Diagnosis present

## 2017-05-04 DIAGNOSIS — Z885 Allergy status to narcotic agent status: Secondary | ICD-10-CM

## 2017-05-04 DIAGNOSIS — Z7982 Long term (current) use of aspirin: Secondary | ICD-10-CM

## 2017-05-04 DIAGNOSIS — Z90710 Acquired absence of both cervix and uterus: Secondary | ICD-10-CM

## 2017-05-04 DIAGNOSIS — E785 Hyperlipidemia, unspecified: Secondary | ICD-10-CM | POA: Diagnosis present

## 2017-05-04 DIAGNOSIS — J449 Chronic obstructive pulmonary disease, unspecified: Secondary | ICD-10-CM | POA: Diagnosis present

## 2017-05-04 DIAGNOSIS — E871 Hypo-osmolality and hyponatremia: Secondary | ICD-10-CM | POA: Diagnosis not present

## 2017-05-04 DIAGNOSIS — N189 Chronic kidney disease, unspecified: Secondary | ICD-10-CM | POA: Diagnosis not present

## 2017-05-04 DIAGNOSIS — Z9111 Patient's noncompliance with dietary regimen: Secondary | ICD-10-CM

## 2017-05-04 DIAGNOSIS — E7800 Pure hypercholesterolemia, unspecified: Secondary | ICD-10-CM | POA: Diagnosis present

## 2017-05-04 DIAGNOSIS — I5033 Acute on chronic diastolic (congestive) heart failure: Secondary | ICD-10-CM

## 2017-05-04 DIAGNOSIS — I130 Hypertensive heart and chronic kidney disease with heart failure and stage 1 through stage 4 chronic kidney disease, or unspecified chronic kidney disease: Secondary | ICD-10-CM | POA: Diagnosis not present

## 2017-05-04 DIAGNOSIS — M7989 Other specified soft tissue disorders: Secondary | ICD-10-CM | POA: Diagnosis not present

## 2017-05-04 DIAGNOSIS — Z79899 Other long term (current) drug therapy: Secondary | ICD-10-CM | POA: Diagnosis not present

## 2017-05-04 DIAGNOSIS — D72819 Decreased white blood cell count, unspecified: Secondary | ICD-10-CM | POA: Diagnosis present

## 2017-05-04 DIAGNOSIS — I509 Heart failure, unspecified: Secondary | ICD-10-CM

## 2017-05-04 DIAGNOSIS — Z888 Allergy status to other drugs, medicaments and biological substances status: Secondary | ICD-10-CM

## 2017-05-04 DIAGNOSIS — N399 Disorder of urinary system, unspecified: Secondary | ICD-10-CM | POA: Diagnosis not present

## 2017-05-04 DIAGNOSIS — N184 Chronic kidney disease, stage 4 (severe): Secondary | ICD-10-CM | POA: Diagnosis not present

## 2017-05-04 DIAGNOSIS — I4891 Unspecified atrial fibrillation: Secondary | ICD-10-CM | POA: Diagnosis not present

## 2017-05-04 DIAGNOSIS — Z515 Encounter for palliative care: Secondary | ICD-10-CM | POA: Diagnosis not present

## 2017-05-04 DIAGNOSIS — I482 Chronic atrial fibrillation: Secondary | ICD-10-CM | POA: Diagnosis present

## 2017-05-04 DIAGNOSIS — Z880 Allergy status to penicillin: Secondary | ICD-10-CM | POA: Diagnosis not present

## 2017-05-04 DIAGNOSIS — Z881 Allergy status to other antibiotic agents status: Secondary | ICD-10-CM | POA: Diagnosis not present

## 2017-05-04 DIAGNOSIS — Z7951 Long term (current) use of inhaled steroids: Secondary | ICD-10-CM | POA: Diagnosis not present

## 2017-05-04 DIAGNOSIS — Z66 Do not resuscitate: Secondary | ICD-10-CM | POA: Diagnosis not present

## 2017-05-04 DIAGNOSIS — J189 Pneumonia, unspecified organism: Secondary | ICD-10-CM | POA: Diagnosis not present

## 2017-05-04 DIAGNOSIS — R609 Edema, unspecified: Secondary | ICD-10-CM

## 2017-05-04 DIAGNOSIS — K219 Gastro-esophageal reflux disease without esophagitis: Secondary | ICD-10-CM | POA: Diagnosis present

## 2017-05-04 DIAGNOSIS — J45909 Unspecified asthma, uncomplicated: Secondary | ICD-10-CM | POA: Diagnosis not present

## 2017-05-04 DIAGNOSIS — R0602 Shortness of breath: Secondary | ICD-10-CM

## 2017-05-04 DIAGNOSIS — Z96653 Presence of artificial knee joint, bilateral: Secondary | ICD-10-CM | POA: Diagnosis present

## 2017-05-04 DIAGNOSIS — J9601 Acute respiratory failure with hypoxia: Secondary | ICD-10-CM | POA: Diagnosis present

## 2017-05-04 LAB — CBC WITH DIFFERENTIAL/PLATELET
Basophils Absolute: 0 10*3/uL (ref 0–0.1)
Basophils Relative: 1 %
Eosinophils Absolute: 0.3 10*3/uL (ref 0–0.7)
Eosinophils Relative: 10 %
HEMATOCRIT: 25.7 % — AB (ref 35.0–47.0)
HEMOGLOBIN: 8.6 g/dL — AB (ref 12.0–16.0)
LYMPHS ABS: 0.4 10*3/uL — AB (ref 1.0–3.6)
Lymphocytes Relative: 11 %
MCH: 30.5 pg (ref 26.0–34.0)
MCHC: 33.7 g/dL (ref 32.0–36.0)
MCV: 90.5 fL (ref 80.0–100.0)
MONOS PCT: 11 %
Monocytes Absolute: 0.4 10*3/uL (ref 0.2–0.9)
NEUTROS ABS: 2.3 10*3/uL (ref 1.4–6.5)
NEUTROS PCT: 67 %
Platelets: 182 10*3/uL (ref 150–440)
RBC: 2.84 MIL/uL — AB (ref 3.80–5.20)
RDW: 14.7 % — ABNORMAL HIGH (ref 11.5–14.5)
WBC: 3.3 10*3/uL — AB (ref 3.6–11.0)

## 2017-05-04 LAB — TROPONIN I: Troponin I: <0.03 ng/mL (ref <0.03)

## 2017-05-04 LAB — BRAIN NATRIURETIC PEPTIDE: B Natriuretic Peptide: 881 pg/mL — ABNORMAL HIGH (ref 0.0–100.0)

## 2017-05-04 LAB — COMPREHENSIVE METABOLIC PANEL
ALK PHOS: 88 U/L (ref 38–126)
ALT: 14 U/L (ref 14–54)
ANION GAP: 8 (ref 5–15)
AST: 20 U/L (ref 15–41)
Albumin: 3.3 g/dL — ABNORMAL LOW (ref 3.5–5.0)
BILIRUBIN TOTAL: 0.5 mg/dL (ref 0.3–1.2)
BUN: 52 mg/dL — ABNORMAL HIGH (ref 6–20)
CALCIUM: 8.6 mg/dL — AB (ref 8.9–10.3)
CO2: 24 mmol/L (ref 22–32)
CREATININE: 2.05 mg/dL — AB (ref 0.44–1.00)
Chloride: 93 mmol/L — ABNORMAL LOW (ref 101–111)
GFR calc Af Amer: 23 mL/min — ABNORMAL LOW (ref >60)
GFR calc non Af Amer: 20 mL/min — ABNORMAL LOW (ref >60)
Glucose, Bld: 112 mg/dL — ABNORMAL HIGH (ref 65–99)
Potassium: 4.6 mmol/L (ref 3.5–5.1)
SODIUM: 125 mmol/L — AB (ref 135–145)
TOTAL PROTEIN: 5.7 g/dL — AB (ref 6.5–8.1)

## 2017-05-04 MED ORDER — SIMVASTATIN 40 MG PO TABS
40.0000 mg | ORAL_TABLET | Freq: Every day | ORAL | Status: DC
  Administered 2017-05-05 – 2017-05-09 (×5): 40 mg via ORAL
  Filled 2017-05-04 (×5): qty 2

## 2017-05-04 MED ORDER — CARVEDILOL 25 MG PO TABS
25.0000 mg | ORAL_TABLET | Freq: Two times a day (BID) | ORAL | Status: DC
  Administered 2017-05-05 – 2017-05-10 (×11): 25 mg via ORAL
  Filled 2017-05-04 (×11): qty 1

## 2017-05-04 MED ORDER — HEPARIN SODIUM (PORCINE) 5000 UNIT/ML IJ SOLN
5000.0000 [IU] | Freq: Three times a day (TID) | INTRAMUSCULAR | Status: DC
  Administered 2017-05-05 – 2017-05-10 (×13): 5000 [IU] via SUBCUTANEOUS
  Filled 2017-05-04 (×13): qty 1

## 2017-05-04 MED ORDER — ACETAMINOPHEN 500 MG PO TABS
1000.0000 mg | ORAL_TABLET | Freq: Every day | ORAL | Status: DC
  Administered 2017-05-05 – 2017-05-10 (×6): 1000 mg via ORAL
  Filled 2017-05-04 (×6): qty 2

## 2017-05-04 MED ORDER — ONDANSETRON HCL 4 MG PO TABS: 4.0000 mg | ORAL_TABLET | Freq: Four times a day (QID) | ORAL | Status: DC | PRN

## 2017-05-04 MED ORDER — FUROSEMIDE 10 MG/ML IJ SOLN
40.0000 mg | Freq: Once | INTRAMUSCULAR | Status: AC
  Administered 2017-05-04: 40 mg via INTRAVENOUS
  Filled 2017-05-04: qty 4

## 2017-05-04 MED ORDER — SODIUM CHLORIDE 0.9 % IV SOLN: 250.0000 mL | INTRAVENOUS | Status: DC | PRN

## 2017-05-04 MED ORDER — MOMETASONE FURO-FORMOTEROL FUM 200-5 MCG/ACT IN AERO
2.0000 | INHALATION_SPRAY | Freq: Two times a day (BID) | RESPIRATORY_TRACT | Status: DC
  Administered 2017-05-05 – 2017-05-10 (×12): 2 via RESPIRATORY_TRACT
  Filled 2017-05-04: qty 8.8

## 2017-05-04 MED ORDER — ONDANSETRON HCL 4 MG/2ML IJ SOLN: 4.0000 mg | Freq: Four times a day (QID) | INTRAMUSCULAR | Status: DC | PRN

## 2017-05-04 MED ORDER — PANTOPRAZOLE SODIUM 40 MG PO TBEC
40.0000 mg | DELAYED_RELEASE_TABLET | Freq: Every day | ORAL | Status: DC
  Administered 2017-05-05 – 2017-05-10 (×6): 40 mg via ORAL
  Filled 2017-05-04 (×6): qty 1

## 2017-05-04 MED ORDER — SODIUM CHLORIDE 0.9% FLUSH
3.0000 mL | Freq: Two times a day (BID) | INTRAVENOUS | Status: DC
  Administered 2017-05-05 – 2017-05-09 (×11): 3 mL via INTRAVENOUS

## 2017-05-04 MED ORDER — HYDRALAZINE HCL 50 MG PO TABS
100.0000 mg | ORAL_TABLET | Freq: Two times a day (BID) | ORAL | Status: DC
  Administered 2017-05-05 – 2017-05-10 (×12): 100 mg via ORAL
  Filled 2017-05-04 (×12): qty 2

## 2017-05-04 MED ORDER — DILTIAZEM HCL ER COATED BEADS 240 MG PO CP24
240.0000 mg | ORAL_CAPSULE | Freq: Every day | ORAL | Status: DC
  Administered 2017-05-05 – 2017-05-10 (×6): 240 mg via ORAL
  Filled 2017-05-04: qty 2
  Filled 2017-05-04: qty 1
  Filled 2017-05-04 (×4): qty 2

## 2017-05-04 MED ORDER — FERROUS GLUCONATE 324 (38 FE) MG PO TABS
324.0000 mg | ORAL_TABLET | Freq: Two times a day (BID) | ORAL | Status: DC
  Administered 2017-05-05 – 2017-05-10 (×11): 324 mg via ORAL
  Filled 2017-05-04 (×12): qty 1

## 2017-05-04 MED ORDER — DOCUSATE SODIUM 100 MG PO CAPS
100.0000 mg | ORAL_CAPSULE | Freq: Two times a day (BID) | ORAL | Status: DC
  Administered 2017-05-05 – 2017-05-10 (×12): 100 mg via ORAL
  Filled 2017-05-04 (×12): qty 1

## 2017-05-04 MED ORDER — TRAMADOL HCL 50 MG PO TABS: 50.0000 mg | ORAL_TABLET | ORAL | Status: DC | PRN

## 2017-05-04 MED ORDER — SODIUM CHLORIDE 0.9% FLUSH: 3.0000 mL | INTRAVENOUS | Status: DC | PRN

## 2017-05-04 MED ORDER — FUROSEMIDE 10 MG/ML IJ SOLN
40.0000 mg | Freq: Two times a day (BID) | INTRAMUSCULAR | Status: DC
  Administered 2017-05-05 – 2017-05-07 (×5): 40 mg via INTRAVENOUS
  Filled 2017-05-04 (×5): qty 160

## 2017-05-04 MED ORDER — IPRATROPIUM-ALBUTEROL 0.5-2.5 (3) MG/3ML IN SOLN: 3.0000 mL | Freq: Four times a day (QID) | RESPIRATORY_TRACT | Status: DC | PRN

## 2017-05-04 MED ORDER — FLUTICASONE PROPIONATE 50 MCG/ACT NA SUSP
2.0000 | Freq: Every day | NASAL | Status: DC
  Administered 2017-05-05 – 2017-05-10 (×6): 2 via NASAL
  Filled 2017-05-04: qty 16

## 2017-05-04 MED ORDER — ASPIRIN EC 81 MG PO TBEC
81.0000 mg | DELAYED_RELEASE_TABLET | Freq: Every day | ORAL | Status: DC
  Administered 2017-05-05 – 2017-05-10 (×6): 81 mg via ORAL
  Filled 2017-05-04 (×6): qty 1

## 2017-05-04 MED ORDER — NITROGLYCERIN 2 % TD OINT
1.0000 [in_us] | TOPICAL_OINTMENT | Freq: Four times a day (QID) | TRANSDERMAL | Status: DC
  Administered 2017-05-04 – 2017-05-05 (×2): 1 [in_us] via TOPICAL
  Filled 2017-05-04 (×2): qty 1

## 2017-05-04 MED ORDER — ISOSORBIDE MONONITRATE ER 60 MG PO TB24
120.0000 mg | ORAL_TABLET | Freq: Every day | ORAL | Status: DC
  Administered 2017-05-05 – 2017-05-10 (×6): 120 mg via ORAL
  Filled 2017-05-04 (×6): qty 2

## 2017-05-04 MED ORDER — LISINOPRIL 10 MG PO TABS
10.0000 mg | ORAL_TABLET | Freq: Every day | ORAL | Status: DC
  Administered 2017-05-05 – 2017-05-10 (×6): 10 mg via ORAL
  Filled 2017-05-04 (×6): qty 1

## 2017-05-04 NOTE — ED Triage Notes (Signed)
Pt with increased shortness of breath worse over the past week. Pt with CHF.

## 2017-05-04 NOTE — H&P (Signed)
[facility] [place] at [facility]   PATIENT NAME: Laura [name]    MR#:  [id number]  DATE OF BIRTH:  11/13/1924  DATE OF ADMISSION:  05/04/2017  PRIMARY CARE PHYSICIAN: Marisue IvanLinthavong, Kanhka, MD   REQUESTING/REFERRING PHYSICIAN:   CHIEF COMPLAINT:   Chief Complaint  Patient presents with  . Shortness of Breath    HISTORY OF PRESENT ILLNESS: Laura [name]  is a 83 y.o. female with a known history of Atrial fibrillation, CHF, CK D stage IV, hyperlipidemia, hypertension, who presents to the hospital with complaints of shortness of breath, worsening over the past one week, especially bad over the past 2 days, patient admits of wheezing, some dry cough. She also notices few episodes of chest pain in the left side of the chest fleeting, she is not sure if they're related to breathing or exercise. Patient was struggling in emergency room to breathe, had pursed lip breathing, hospitalist services were contacted for admission. Patient's daughter admitted of 12 pound weight gain over the past 3 weeks, 5 pounds In over the past 2-3 days  PAST MEDICAL HISTORY:   Past Medical History:  Diagnosis Date  . Anemia   . Arrhythmia    atrial fibrillation  . Atrial fibrillation (HCC)   . CHF (congestive heart failure) (HCC)   . Chronic kidney disease   . GERD (gastroesophageal reflux disease)   . GI bleed   . High cholesterol   . Hypertension   . Hyponatremia   . Macular degeneration   . SVT (supraventricular tachycardia) (HCC)     PAST SURGICAL HISTORY: Past Surgical History:  Procedure Laterality Date  . ABDOMINAL HYSTERECTOMY    . JOINT REPLACEMENT    . REPLACEMENT TOTAL KNEE BILATERAL    . VAGINAL HYSTERECTOMY     Partial    SOCIAL HISTORY:  Social History  Substance Use Topics  . Smoking status: Never Smoker  . Smokeless tobacco: Never Used  . Alcohol use No    FAMILY HISTORY:  Family History  Problem Relation Age of Onset  . Hypertension Mother    . CAD Father   . Heart disease Father   . Arthritis Father   . Pancreatic cancer Son   . Heart failure Sister   . Diabetes Brother   . Heart failure Sister   . Diabetes Son     DRUG ALLERGIES:  Allergies  Allergen Reactions  . Penicillins Shortness Of Breath        . Codeine Itching  . Amlodipine Rash    Peripheral edema  . Ciprofloxacin Rash  . Sulfa Antibiotics Rash    Review of Systems  Constitutional: Negative for chills, fever and weight loss.  HENT: Negative for congestion.   Eyes: Positive for blurred vision. Negative for double vision.  Respiratory: Positive for cough, shortness of breath and wheezing. Negative for sputum production.   Cardiovascular: Positive for chest pain, palpitations, orthopnea and leg swelling. Negative for PND.  Gastrointestinal: Negative for abdominal pain, blood in stool, constipation, diarrhea, nausea and vomiting.  Genitourinary: Negative for dysuria, frequency, hematuria and urgency.  Musculoskeletal: Negative for falls.  Neurological: Negative for dizziness, tremors, focal weakness and headaches.  Endo/Heme/Allergies: Does not bruise/bleed easily.  Psychiatric/Behavioral: Negative for depression. The patient does not have insomnia.     MEDICATIONS AT HOME:  Prior to Admission medications   Medication Sig Start Date End Date Taking? Authorizing Provider  acetaminophen (TYLENOL) 500 MG tablet Take 1,000 mg by mouth daily. Take 2  tabs (1000mg ) by mouth every morning.  Take 2 tablets in evening if needed.    [provider]  aspirin EC 81 MG tablet Take 81 mg by mouth daily.    [provider]  carvedilol (COREG) 25 MG tablet Take 1 tablet (25 mg total) by mouth 2 (two) times daily with a meal. 02/27/17   Adrian Saran, MD  diltiazem (CARDIZEM CD) 240 MG 24 hr capsule Take 1 capsule (240 mg total) by mouth daily. 02/28/17   Adrian Saran, MD  docusate sodium (COLACE) 100 MG capsule Take 100 mg by mouth 2 (two) times daily.  01/02/17   [provider]  Ferrous Gluconate 324 (37.5 Fe) MG TABS Take 1 tablet by mouth 2 (two) times daily. 11/15/16   [provider]  fluticasone (FLONASE) 50 MCG/ACT nasal spray Place 2 sprays into both nostrils daily.    [provider]  hydrALAZINE (APRESOLINE) 100 MG tablet Take 100 mg by mouth 2 (two) times daily. 12/23/16   [provider]  ipratropium (ATROVENT HFA) 17 MCG/ACT inhaler Inhale 2 puffs into the lungs 4 (four) times daily. Patient not taking: Reported on 04/11/2017 04/10/17 04/10/18  Enid Baas, MD  ipratropium-albuterol (DUONEB) 0.5-2.5 (3) MG/3ML SOLN Take 3 mLs by nebulization every 6 (six) hours as needed. 04/10/17   Enid Baas, MD  isosorbide mononitrate (IMDUR) 120 MG 24 hr tablet Take 1 tablet by mouth daily. 12/23/16   [provider]  levofloxacin (LEVAQUIN) 500 MG tablet Take 1 tablet (500 mg total) by mouth every other day. X 8 more days 04/12/17   Enid Baas, MD  lisinopril (PRINIVIL,ZESTRIL) 10 MG tablet Take 10 mg by mouth daily. 11/15/16   [provider]  Lutein 20 MG TABS Take 1 tablet by mouth daily.    [provider]  mometasone-formoterol (DULERA) 200-5 MCG/ACT AERO Inhale 2 puffs into the lungs 2 (two) times daily. 04/10/17   Enid Baas, MD  Multiple Vitamins-Minerals (PRESERVISION AREDS PO) Take 1 tablet by mouth daily.     [provider]  pantoprazole (PROTONIX) 40 MG tablet Take 40 mg by mouth daily. 12/23/16   [provider]  simvastatin (ZOCOR) 40 MG tablet Take 40 mg by mouth daily at 6 PM.  12/23/16   [provider]  torsemide (DEMADEX) 20 MG tablet Take 1 tablet (20 mg total) by mouth daily. Take another 20mg  in the afternoon if feeling short of breath. 04/10/17   Enid Baas, MD  traMADol (ULTRAM) 50 MG tablet Take 50 mg by mouth every 4 (four) hours as needed.    [provider]      PHYSICAL EXAMINATION:   VITAL  SIGNS: Blood pressure (!) 143/106, pulse 97, temperature 98.3 F (36.8 C), temperature source Oral, resp. rate 20, height 4\' 9"  (1.448 m), weight 58.1 kg (128 lb), SpO2 97 %.  GENERAL:  83 y.o.-year-old patient lying in the bed , Not to moderate respiratory distress, pursed lip breathing.  EYES: Pupils equal, round, reactive to light and accommodation. No scleral icterus. Extraocular muscles intact.  HEENT: Head atraumatic, normocephalic. Oropharynx and nasopharynx clear.  NECK:  Supple, no jugular venous distention. No thyroid enlargement, no tenderness.  LUNGS: Normal breath sounds bilaterally, no wheezing, you. Scattered basilar rales,rhonchi and crepitations noted on auscultation. Using accessory muscles of respiration.  CARDIOVASCULAR: S1, S2 , irregularly irregular. No murmurs, rubs, or gallops.  ABDOMEN: Soft, nontender, , mildly uncomfortable and epigastric area but no rebound or guarding was noted, nondistended. Bowel  sounds present. No organomegaly or mass.  EXTREMITIES: 3+ lower extremity and pedal edema, no cyanosis, or clubbing.  NEUROLOGIC: Cranial nerves II through XII are intact. Muscle strength 5/5 in all extremities. Sensation intact. Gait not checked.  PSYCHIATRIC: The patient is alert and oriented x 3.  SKIN: No obvious rash, lesion, or ulcer.   LABORATORY PANEL:   CBC  Recent Labs Lab 05/04/17 1828  WBC 3.3*  HGB 8.6*  HCT 25.7*  PLT 182  MCV 90.5  MCH 30.5  MCHC 33.7  RDW 14.7*  LYMPHSABS 0.4*  MONOABS 0.4  EOSABS 0.3  BASOSABS 0.0   ------------------------------------------------------------------------------------------------------------------  Chemistries   Recent Labs Lab 05/04/17 1828  NA 125*  K 4.6  CL 93*  CO2 24  GLUCOSE 112*  BUN 52*  CREATININE 2.05*  CALCIUM 8.6*  AST 20  ALT 14  ALKPHOS 88  BILITOT 0.5    ------------------------------------------------------------------------------------------------------------------  Cardiac Enzymes  Recent Labs Lab 05/04/17 1828  TROPONINI <0.03   ------------------------------------------------------------------------------------------------------------------  RADIOLOGY: Dg Chest 2 View  Result Date: 05/04/2017 CLINICAL DATA:  Increased shortness breath over the past week. EXAM: CHEST  2 VIEW COMPARISON:  04/10/2017. FINDINGS: Stable enlarged cardiac silhouette and tortuous and calcified thoracic aorta. Probably no significant change in prominence of the interstitial markings when differences in depth of inspiration are taken into account. Linear density in the right mid and lower lung zones and at the left lung base. There is some persistent opacity of the medial right lung base without the mass like appearance previously seen. Small bilateral pleural effusions with improvement. Mild thoracic spine degenerative changes. Cervical spine degenerative changes. IMPRESSION: 1. Interval linear atelectasis in the right mid and lower lung zones and at the left lung base. 2. Persistent atelectasis or pneumonia at the medial right lung base without the previously seen mass like appearance. 3. Decreased size of small bilateral pleural effusions. 4. Stable cardiomegaly and chronic interstitial lung disease. Electronically Signed   By: Beckie Salts M.D.   On: 05/04/2017 18:55    EKG: Orders placed or performed during the hospital encounter of 05/04/17  . EKG 12-Lead  . EKG 12-Lead  . ED EKG  . ED EKG   EKG in emergency room revealed atrial fibrillation, rate of 94 bpm, left axis deviation, poor R-wave progression, likely anterior infarct, no acute STT  changes IMPRESSION AND PLAN:  Active Problems:   Acute on chronic diastolic CHF (congestive heart failure) (HCC)   CKD (chronic kidney disease), stage IV (HCC)   Leukopenia   Swelling of lower extremity  #1.  Acute on chronic diastolic CHF, admit patient to medical floor, initiate Lasix intravenously, following in's and outs, weight, continue oxygen, add nitroglycerin topically to facilitate diuresis #2 CKD, stage IV, follow with diuresis #3. Leukopenia, recheck CBC tomorrow morning., Etiology is unclear #4 left extremity swelling, likely due to CHF, rule out DVT, get Dopplers  All the records are reviewed and case discussed with ED provider. Management plans discussed with the patient, family and they are in agreement.  CODE STATUS: Code Status History    Date Active Date Inactive Code Status Order ID Comments User Context   04/08/2017  4:54 PM 04/10/2017  7:29 PM DNR [id number]  Ramonita Lab, MD Inpatient   03/17/2017 10:31 PM 03/21/2017  3:20 PM DNR [id number]  Ramonita Lab, MD Inpatient   02/19/2017 11:06 PM 02/27/2017  9:18 PM DNR [id number]  Altamese Dilling, MD Inpatient    Questions for Most Recent Historical Code Status (  Order [id number])    Question Answer Comment   In the event of cardiac or respiratory ARREST Do not call a "code blue"    In the event of cardiac or respiratory ARREST Do not perform Intubation, CPR, defibrillation or ACLS    In the event of cardiac or respiratory ARREST Use medication by any route, position, wound care, and other measures to relive pain and suffering. May use oxygen, suction and manual treatment of airway obstruction as needed for comfort.    Comments RN may pronounce         Advance Directive Documentation     Most Recent Value  Type of Advance Directive  Healthcare Power of Attorney, Living will  Pre-existing out of facility DNR order (yellow form or pink MOST form)  -  "MOST" Form in Place?  -       TOTAL TIME TAKING CARE OF THIS PATIENT: 50 minutes.    Katharina Caper M.D on 05/04/2017 at 9:10 PM  Between 7am to 6pm - Pager - [phone] After 6pm go to [email or web address] - password [email or web address] [facility]  [facility]  Office   [phone]  CC: Primary care physician; Marisue Ivan, MD

## 2017-05-04 NOTE — ED Notes (Signed)
Unsuccessful IV start x2 by primary nurse.

## 2017-05-04 NOTE — ED Notes (Signed)
Pt put on 2L Oxygen via [place] per MD order

## 2017-05-04 NOTE — ED Provider Notes (Signed)
[facility] Emergency Department Provider Note  ____________________________________________   First MD Initiated Contact with Patient 05/04/17 2023     (approximate)  I have reviewed the triage vital signs and the nursing notes.   HISTORY  Chief Complaint Shortness of Breath    HPI MENDI CONSTABLE is a 83 y.o. female with a history of CHF and who has been struggling over the last few months with issues associated with volume overload who presents with her daughter for evaluation of increased shortness of breath over the last week.  It has been gradual in onset.  They have been trying to adjust her torsemide to optimize her volume status but in spite of that she feels like she is putting on weight and having increased swelling in her lower extremities and her abdomen which is where she shows the excess volume.  As of today she is having to breathe through pursed lips and use accessory muscles.  She denies fever/chills, chest pain, nausea, vomiting, abdominal pain.  Exertion and lying flat makes her respiratory distress worse, resting and sitting up makes it better.   Past Medical History:  Diagnosis Date  . Anemia   . Arrhythmia    atrial fibrillation  . Atrial fibrillation (HCC)   . CHF (congestive heart failure) (HCC)   . Chronic kidney disease   . GERD (gastroesophageal reflux disease)   . GI bleed   . High cholesterol   . Hypertension   . Hyponatremia   . Macular degeneration   . SVT (supraventricular tachycardia) [facility])     Patient Active Problem List   Diagnosis Date Noted  . Acute on chronic diastolic CHF (congestive heart failure) (HCC) 05/04/2017  . CKD (chronic kidney disease), stage IV (HCC) 05/04/2017  . Leukopenia 05/04/2017  . Swelling of lower extremity 05/04/2017  . Acute CHF (congestive heart failure) (HCC) 04/08/2017  . Atrial fibrillation (HCC) 03/04/2017  . HTN (hypertension) 03/04/2017  . Hyponatremia 03/04/2017  . Acute  CHF (HCC) 02/19/2017  . CHF (congestive heart failure) (HCC) 02/19/2017    Past Surgical History:  Procedure Laterality Date  . ABDOMINAL HYSTERECTOMY    . JOINT REPLACEMENT    . REPLACEMENT TOTAL KNEE BILATERAL    . VAGINAL HYSTERECTOMY     Partial    Prior to Admission medications   Medication Sig Start Date End Date Taking? Authorizing Provider  acetaminophen (TYLENOL) 500 MG tablet Take 1,000 mg by mouth daily. Take 2 tabs (1000mg ) by mouth every morning.  Take 2 tablets in evening if needed.    [provider]  aspirin EC 81 MG tablet Take 81 mg by mouth daily.    [provider]  carvedilol (COREG) 25 MG tablet Take 1 tablet (25 mg total) by mouth 2 (two) times daily with a meal. 02/27/17   Adrian Saran, MD  diltiazem (CARDIZEM CD) 240 MG 24 hr capsule Take 1 capsule (240 mg total) by mouth daily. 02/28/17   Adrian Saran, MD  docusate sodium (COLACE) 100 MG capsule Take 100 mg by mouth 2 (two) times daily. 01/02/17   [provider]  Ferrous Gluconate 324 (37.5 Fe) MG TABS Take 1 tablet by mouth 2 (two) times daily. 11/15/16   [provider]  fluticasone (FLONASE) 50 MCG/ACT nasal spray Place 2 sprays into both nostrils daily.    [provider]  hydrALAZINE (APRESOLINE) 100 MG tablet Take 100 mg by mouth 2 (two) times daily. 12/23/16   [provider]  ipratropium-albuterol (DUONEB) 0.5-2.5 (3) MG/3ML SOLN Take 3 mLs by nebulization every 6 (six) hours as needed. 04/10/17   Enid Baas, MD  isosorbide mononitrate (IMDUR) 120 MG 24 hr tablet Take 1 tablet by mouth daily. 12/23/16   [provider]  lisinopril (PRINIVIL,ZESTRIL) 10 MG tablet Take 10 mg by mouth daily. 11/15/16   [provider]  Lutein 20 MG TABS Take 1 tablet by mouth daily.    [provider]  mometasone-formoterol (DULERA) 200-5 MCG/ACT AERO Inhale 2 puffs into the lungs 2 (two) times daily. 04/10/17   Enid Baas, MD  Multiple  Vitamins-Minerals (PRESERVISION AREDS PO) Take 1 tablet by mouth daily.     [provider]  pantoprazole (PROTONIX) 40 MG tablet Take 40 mg by mouth daily. 12/23/16   [provider]  simvastatin (ZOCOR) 40 MG tablet Take 40 mg by mouth daily at 6 PM.  12/23/16   [provider]  torsemide (DEMADEX) 20 MG tablet Take 1 tablet (20 mg total) by mouth daily. Take another 20mg  in the afternoon if feeling short of breath. 04/10/17   Enid Baas, MD  traMADol (ULTRAM) 50 MG tablet Take 50 mg by mouth every 4 (four) hours as needed.    [provider]    Allergies Penicillins; Codeine; Amlodipine; Ciprofloxacin; and Sulfa antibiotics  Family History  Problem Relation Age of Onset  . Hypertension Mother   . CAD Father   . Heart disease Father   . Arthritis Father   . Pancreatic cancer Son   . Heart failure Sister   . Diabetes Brother   . Heart failure Sister   . Diabetes Son     Social History Social History  Substance Use Topics  . Smoking status: Never Smoker  . Smokeless tobacco: Never Used  . Alcohol use No    Review of Systems Constitutional: No fever/chills Eyes: No visual changes. ENT: No sore throat. Cardiovascular: Denies chest pain. Respiratory: +shortness of breath Over the last week Gastrointestinal: No abdominal pain.  No nausea, no vomiting.  No diarrhea.  No constipation. Genitourinary: Negative for dysuria.  Swelling in her abdomen Musculoskeletal: Swelling in her legs.  Negative for neck pain.  Negative for back pain. Integumentary: Negative for rash. Neurological: Negative for headaches, focal weakness or numbness.   ____________________________________________   PHYSICAL EXAM:  VITAL SIGNS: ED Triage Vitals  Enc Vitals Group     BP 05/04/17 1820 130/78     Pulse Rate 05/04/17 1820 84     Resp 05/04/17 1820 16     Temp 05/04/17 1820 98.3 F (36.8 C)     Temp Source 05/04/17 1820 Oral     SpO2 05/04/17 1820 99 %      Weight 05/04/17 1821 58.1 kg (128 lb)     Height 05/04/17 1821 1.448 m (4\' 9" )     Head Circumference --      Peak Flow --      Pain Score --      Pain Loc --      Pain Edu? --      Excl. in GC? --     Constitutional: Alert and oriented.  The patient is in mild respiratory distress lying in bed sitting up. Eyes: Conjunctivae are normal.  Head: Atraumatic. Nose: No congestion/rhinnorhea. Mouth/Throat: Mucous membranes are moist. Neck: No stridor.  No meningeal signs.   Cardiovascular: Normal rate, regular rhythm. Good peripheral circulation. Grossly normal heart sounds. Respiratory: Mildly increased respiratory effort.  Mild retractions  with accessory muscle usage and pursed lips (auto-peeping). Coarse breath sounds in bases.  Speaking in complete sentences. Gastrointestinal: Soft and nontender. No distention.  Musculoskeletal: One to 2+ pitting edema in bilateral lower extremities. No gross deformities of extremities. Neurologic:  Normal speech and language. No gross focal neurologic deficits are appreciated.  Skin:  Skin is warm, dry and intact. No rash noted.   ____________________________________________   LABS (all labs ordered are listed, but only abnormal results are displayed)  Labs Reviewed  CBC WITH DIFFERENTIAL/PLATELET - Abnormal; Notable for the following:       Result Value   WBC 3.3 (*)    RBC 2.84 (*)    Hemoglobin 8.6 (*)    HCT 25.7 (*)    RDW 14.7 (*)    Lymphs Abs 0.4 (*)    All other components within normal limits  COMPREHENSIVE METABOLIC PANEL - Abnormal; Notable for the following:    Sodium 125 (*)    Chloride 93 (*)    Glucose, Bld 112 (*)    BUN 52 (*)    Creatinine, Ser 2.05 (*)    Calcium 8.6 (*)    Total Protein 5.7 (*)    Albumin 3.3 (*)    GFR calc non Af Amer 20 (*)    GFR calc Af Amer 23 (*)    All other components within normal limits  BRAIN NATRIURETIC PEPTIDE - Abnormal; Notable for the following:    B Natriuretic Peptide  881.0 (*)    All other components within normal limits  TROPONIN I   ____________________________________________  EKG  ED ECG REPORT I, Roth Ress, the attending physician, personally viewed and interpreted this ECG.  Date: 05/04/2017 EKG Time: 18:19 Rate: 94 Rhythm: atrial fibrillation QRS Axis: normal Intervals: abnormal due to a-fib ST/T Wave abnormalities: normal Narrative Interpretation: No evidence of acute ischemia  ____________________________________________  RADIOLOGY   Dg Chest 2 View  Result Date: 05/04/2017 CLINICAL DATA:  Increased shortness breath over the past week. EXAM: CHEST  2 VIEW COMPARISON:  04/10/2017. FINDINGS: Stable enlarged cardiac silhouette and tortuous and calcified thoracic aorta. Probably no significant change in prominence of the interstitial markings when differences in depth of inspiration are taken into account. Linear density in the right mid and lower lung zones and at the left lung base. There is some persistent opacity of the medial right lung base without the mass like appearance previously seen. Small bilateral pleural effusions with improvement. Mild thoracic spine degenerative changes. Cervical spine degenerative changes. IMPRESSION: 1. Interval linear atelectasis in the right mid and lower lung zones and at the left lung base. 2. Persistent atelectasis or pneumonia at the medial right lung base without the previously seen mass like appearance. 3. Decreased size of small bilateral pleural effusions. 4. Stable cardiomegaly and chronic interstitial lung disease. Electronically Signed   By: Beckie Salts M.D.   On: 05/04/2017 18:55    ____________________________________________   PROCEDURES  Critical Care performed: No   Procedure(s) performed:   Procedures   ____________________________________________   INITIAL IMPRESSION / ASSESSMENT AND PLAN / ED COURSE  Pertinent labs & imaging results that were available during my  care of the patient were reviewed by me and considered in my medical decision making (see chart for details).  The patient has a BNP of nearly 900 and although this is lower than during her prior admission, the BNP was not trended not know what is her baseline.  Clinically she needs diuresis based on peripheral  edema, chest x-ray, and increased respiratory effort with auto PEEP.  I discussed it with the patient and her daughter and I will give Lasix 40 mg IV and admit for further management of CHF exacerbation.  The patient and daughter agree with the plan.      ____________________________________________  FINAL CLINICAL IMPRESSION(S) / ED DIAGNOSES  Final diagnoses:  Acute on chronic congestive heart failure, unspecified heart failure type (HCC)     MEDICATIONS GIVEN DURING THIS VISIT:  Medications  nitroGLYCERIN (NITROGLYN) 2 % ointment 1 inch (not administered)  furosemide (LASIX) injection 40 mg (40 mg Intravenous Given 05/04/17 2109)     NEW OUTPATIENT MEDICATIONS STARTED DURING THIS VISIT:  New Prescriptions   No medications on file    Modified Medications   No medications on file    Discontinued Medications   IPRATROPIUM (ATROVENT HFA) 17 MCG/ACT INHALER    Inhale 2 puffs into the lungs 4 (four) times daily.   LEVOFLOXACIN (LEVAQUIN) 500 MG TABLET    Take 1 tablet (500 mg total) by mouth every other day. X 8 more days     Note:  This document was prepared using Dragon voice recognition software and may include unintentional dictation errors.    Loleta RoseForbach, Alesha Jaffee, MD 05/04/17 2120

## 2017-05-04 NOTE — Telephone Encounter (Signed)
Melinda with [facility] contacted the office in regards to Ms. Troxler. She states that her family is concerned about her having fluid on board. States that she did have an overnight weight gain of 2lbs, with distention of the abdomen. She does admit to having increasing shortness of breath but denies any chest pain.   She is currently taking 40mg  of Torsemide daily but feels like that is not working to keep the weight off. I have scheduled the patient to be seen in clinic tomorrow 05/05/2017 at 10:40 AM. I did advise that if her symptoms worsen or she begins to experience chest pain she may need to be seen in the ED.   Juliette AlcideMelinda verbalized understanding and will inform Ms. Swaney's daughter.

## 2017-05-04 NOTE — ED Notes (Signed)
Pt assisted to use the commode in the room; bed was wheeled to the commode and patient was assisted to ambulate to the commode and back to the bed.

## 2017-05-04 NOTE — Patient Outreach (Signed)
Transition of care call successful, ongoing follow up on recent hospitalization 5/19-5/21 for Atrial fib, acute on chronic HF.   Spoke with daughter Glee ArvinLucinda (on consent form, [facility] contact [facility] HIPPA/identity verified on pt- discussed purpose of call, following pt for transition of care.   Daughter reports pt is not doing good today, put on fluid, HH RN came today - left a message with Cardiologist about weight gain, elevated HR, waiting for a call back.   Daughter reports Mardene CelesteJoanna (Personnel officerpt's sitter) relayed to her [facility] stated pt's HR was 118.  Daughter reports pt's weight today was 129 lbs, hospital discharge weight was 117 lbs. - felt took too much fluid off.   Daughter reports  last 2 weeks pt  had a steady weight gain, past 1.5 weeks pt took  extra fluid pill daily at 3 pm- not loosing any weight.  Daughter reports per sitter- pt has a knot in stomach which [facility].  Daughter reports pt's stomach is huge, feet swollen again- yesterday when saw pt, she  was sitting up in chair, problems breathing lying down,gauges her Mom's breathing by how she talks- did okay last night.   Daughter reports she is going to see pt after work today, if no return call from MD and pt is still sob- going to take her to ED to which RN CM agreed.    Plan:  As discussed with daughter, reinforced taking pt to ED if no improvement in sob.               As discussed with daughter, plan to follow up again next week- home visit.   Shayne Alkenose M.   Pierzchala RN CCM [facility] Care Management  [phone]

## 2017-05-05 ENCOUNTER — Ambulatory Visit: Payer: Medicare Other | Admitting: Family

## 2017-05-05 ENCOUNTER — Inpatient Hospital Stay: Payer: Medicare Other

## 2017-05-05 LAB — TROPONIN I
Troponin I: <0.03 ng/mL (ref <0.03)
Troponin I: <0.03 ng/mL (ref <0.03)
Troponin I: <0.03 ng/mL (ref <0.03)

## 2017-05-05 LAB — BASIC METABOLIC PANEL
Anion gap: 7 (ref 5–15)
BUN: 47 mg/dL — ABNORMAL HIGH (ref 6–20)
CHLORIDE: 96 mmol/L — AB (ref 101–111)
CO2: 27 mmol/L (ref 22–32)
Calcium: 8.7 mg/dL — ABNORMAL LOW (ref 8.9–10.3)
Creatinine, Ser: 1.94 mg/dL — ABNORMAL HIGH (ref 0.44–1.00)
GFR calc non Af Amer: 22 mL/min — ABNORMAL LOW (ref >60)
GFR, EST AFRICAN AMERICAN: 25 mL/min — AB (ref >60)
Glucose, Bld: 87 mg/dL (ref 65–99)
POTASSIUM: 3.5 mmol/L (ref 3.5–5.1)
SODIUM: 130 mmol/L — AB (ref 135–145)

## 2017-05-05 LAB — TSH: TSH: 1.823 u[IU]/mL (ref 0.350–4.500)

## 2017-05-05 LAB — CBC
HEMATOCRIT: 24.3 % — AB (ref 35.0–47.0)
HEMOGLOBIN: 8.3 g/dL — AB (ref 12.0–16.0)
MCH: 30.4 pg (ref 26.0–34.0)
MCHC: 34.2 g/dL (ref 32.0–36.0)
MCV: 88.8 fL (ref 80.0–100.0)
PLATELETS: 168 10*3/uL (ref 150–440)
RBC: 2.74 MIL/uL — AB (ref 3.80–5.20)
RDW: 14.3 % (ref 11.5–14.5)
WBC: 3.3 10*3/uL — AB (ref 3.6–11.0)

## 2017-05-05 NOTE — Care Management (Addendum)
Patient is currently followed by [facility] and the [facility].  Also has teleheath.  Presents to ED with shortness of breath for one week duration.  Have reached out to [facility] to discuss management of care needs/medical condition in the home.  has home nebulizer.  Has not required home oxygen. Home health nurse has noted increasing confusion since 6/12 and was waiting on orders from Dr Juliann Paresallwood to in crease diuretics. Contacted [facility]  yesterday but provider had left for the day.   Home health nurse thinks patient may be nearing hospice criteria and has discussed hospice services with patient's daughter Lucindy.  May benefit from palliative consult.  Ptient is currently DNR.

## 2017-05-05 NOTE — Consult Note (Signed)
   [facility]   05/05/2017  Laura [name] 10/31/1924 [id number]   [facility] for chronic disease management services.  Patient has been engaged by a [employer] Community Care Coordinator.  Our community based plan of care has focused on disease management and community resource support.  Patient will receive a post discharge transition of care call and will be evaluated for monthly home visits for assessments and disease process education.  Made Inpatient Case Manager aware that [facility] following. Of note, [facility] services does not replace or interfere with any services that are needed or arranged by inpatient case management or social work.  For additional questions or referrals please contact:  Ripken Rekowski RN, BSN [facility]  Hospital Liaison  [phone]) Business Mobile [phone]) Toll free office

## 2017-05-05 NOTE — Progress Notes (Signed)
[employer] [facility] - [place] [facility]   PATIENT NAME: Laura [name]    MR#:  [id number]  DATE OF BIRTH:  15-Mar-1924  SUBJECTIVE:  CHIEF COMPLAINT:   Chief Complaint  Patient presents with  . Shortness of Breath  sleepy as she couldn't sleep last night, feels SOB, no new complaints REVIEW OF SYSTEMS:  Review of Systems  Constitutional: Negative for chills, fever and malaise/fatigue.  HENT: Negative for congestion, ear discharge, hearing loss and nosebleeds.   Eyes: Negative for blurred vision and double vision.  Respiratory: Positive for shortness of breath. Negative for cough and wheezing.   Cardiovascular: Negative for chest pain, palpitations and leg swelling.  Gastrointestinal: Negative for abdominal pain, constipation, diarrhea, nausea and vomiting.  Genitourinary: Negative for dysuria.  Musculoskeletal: Negative for myalgias.  Neurological: Negative for dizziness, speech change, focal weakness, seizures and headaches.  Psychiatric/Behavioral: Negative for depression.   DRUG ALLERGIES:   Allergies  Allergen Reactions  . Penicillins Shortness Of Breath        . Codeine Itching  . Amlodipine Rash    Peripheral edema  . Ciprofloxacin Rash  . Sulfa Antibiotics Rash    VITALS:  Blood pressure (!) 137/99, pulse 98, temperature 97.5 F (36.4 C), temperature source Oral, resp. rate 18, height 4\' 9"  (1.448 m), weight 58.4 kg (128 lb 11.2 oz), SpO2 99 %.  PHYSICAL EXAMINATION:  Physical Exam  GENERAL:  83 y.o.-year-old elderly patient lying in the bed with no acute distress.  EYES: Pupils equal, round, reactive to light and accommodation. No scleral icterus. Extraocular muscles intact.  HEENT: Head atraumatic, normocephalic. Oropharynx and nasopharynx clear.  NECK:  Supple, no jugular venous distention. No thyroid enlargement, no tenderness.  LUNGS: Normal breath sounds bilaterally, no wheezing, rhonchi or crepitation. Fine bibasilar rales. No use of  accessory muscles of respiration.  CARDIOVASCULAR: S1, S2 normal. No rubs, or gallops. 3/6 systolic murmur present ABDOMEN: Soft, nontender, nondistended. Bowel sounds present. No organomegaly or mass.  EXTREMITIES: No pedal edema, cyanosis, or clubbing.  NEUROLOGIC: Cranial nerves II through XII are intact. Muscle strength 5/5 in all extremities. Sensation intact. Gait not checked.  PSYCHIATRIC: The patient is alert and oriented x 3.  SKIN: No obvious rash, lesion, or ulcer.  LABORATORY PANEL:   CBC  Recent Labs Lab 05/05/17 0547  WBC 3.3*  HGB 8.3*  HCT 24.3*  PLT 168   ------------------------------------------------------------------------------------------------------------------  Chemistries   Recent Labs Lab 05/04/17 1828 05/05/17 0547  NA 125* 130*  K 4.6 3.5  CL 93* 96*  CO2 24 27  GLUCOSE 112* 87  BUN 52* 47*  CREATININE 2.05* 1.94*  CALCIUM 8.6* 8.7*  AST 20  --   ALT 14  --   ALKPHOS 88  --   BILITOT 0.5  --    ------------------------------------------------------------------------------------------------------------------  Cardiac Enzymes  Recent Labs Lab 05/05/17 1201  TROPONINI <0.03   ------------------------------------------------------------------------------------------------------------------  RADIOLOGY:  Dg Chest 2 View  Result Date: 05/04/2017 CLINICAL DATA:  Increased shortness breath over the past week. EXAM: CHEST  2 VIEW COMPARISON:  04/10/2017. FINDINGS: Stable enlarged cardiac silhouette and tortuous and calcified thoracic aorta. Probably no significant change in prominence of the interstitial markings when differences in depth of inspiration are taken into account. Linear density in the right mid and lower lung zones and at the left lung base. There is some persistent opacity of the medial right lung base without the mass like appearance previously seen. Small bilateral pleural effusions with improvement. Mild  thoracic spine  degenerative changes. Cervical spine degenerative changes. IMPRESSION: 1. Interval linear atelectasis in the right mid and lower lung zones and at the left lung base. 2. Persistent atelectasis or pneumonia at the medial right lung base without the previously seen mass like appearance. 3. Decreased size of small bilateral pleural effusions. 4. Stable cardiomegaly and chronic interstitial lung disease. Electronically Signed   By: Beckie SaltsSteven  Reid M.D.   On: 05/04/2017 18:55   [place] Venous Img Lower Bilateral  Result Date: 05/05/2017 CLINICAL DATA:  Leg swelling EXAM: BILATERAL LOWER EXTREMITY VENOUS DOPPLER ULTRASOUND TECHNIQUE: Gray-scale sonography with graded compression, as well as color Doppler and duplex ultrasound were performed to evaluate the lower extremity deep venous systems from the level of the common femoral vein and including the common femoral, femoral, profunda femoral, popliteal and calf veins including the posterior tibial, peroneal and gastrocnemius veins when visible. The superficial great saphenous vein was also interrogated. Spectral Doppler was utilized to evaluate flow at rest and with distal augmentation maneuvers in the common femoral, femoral and popliteal veins. COMPARISON:  None. FINDINGS: RIGHT LOWER EXTREMITY Common Femoral Vein: No evidence of thrombus. Normal compressibility, respiratory phasicity and response to augmentation. Saphenofemoral Junction: No evidence of thrombus. Normal compressibility and flow on color Doppler imaging. Profunda Femoral Vein: No evidence of thrombus. Normal compressibility and flow on color Doppler imaging. Femoral Vein: No evidence of thrombus. Normal compressibility, respiratory phasicity and response to augmentation. Popliteal Vein: No evidence of thrombus. Normal compressibility, respiratory phasicity and response to augmentation. Calf Veins: No evidence of thrombus. Normal compressibility and flow on color Doppler imaging. Superficial Great Saphenous  Vein: No evidence of thrombus. Normal compressibility and flow on color Doppler imaging. Venous Reflux:  None. Other Findings:  None. LEFT LOWER EXTREMITY Common Femoral Vein: No evidence of thrombus. Normal compressibility, respiratory phasicity and response to augmentation. Saphenofemoral Junction: No evidence of thrombus. Normal compressibility and flow on color Doppler imaging. Profunda Femoral Vein: No evidence of thrombus. Normal compressibility and flow on color Doppler imaging. Femoral Vein: No evidence of thrombus. Normal compressibility, respiratory phasicity and response to augmentation. Popliteal Vein: No evidence of thrombus. Normal compressibility, respiratory phasicity and response to augmentation. Calf Veins: No evidence of thrombus. Normal compressibility and flow on color Doppler imaging. Superficial Great Saphenous Vein: No evidence of thrombus. Normal compressibility and flow on color Doppler imaging. Venous Reflux:  None. Other Findings:  None. IMPRESSION: No evidence of DVT within either lower extremity. Electronically Signed   By: Marlan Palauharles  Clark M.D.   On: 05/05/2017 11:11    EKG:   Orders placed or performed during the hospital encounter of 05/04/17  . EKG 12-Lead  . EKG 12-Lead  . ED EKG  . ED EKG    ASSESSMENT AND PLAN:  83 year old female with past medical history significant for atrial fibrillation not on anticoagulation, diastolic CHF, hypertension, CK D, hyperlipidemia presents from home secondary to dyspnea and noted to have CHF   #1 Acute on chronic diastolic CHF exacerbation-likely secondary to dietary noncompliance  -Strict  in's and outs, daily weights.  -Continue IV Lasix 40 mg BID.  -Recent echocardiogram with normal ejection fraction.  #2 CKD, stage IV, follow with diuresis, creat 2.0->1.9  #3. Leukopenia, recheck CBC tomorrow morning., Etiology is unclear  #4 left extremity swelling, likely due to CHF, ruled out DVT, neg LE Dopplers  #5  hypertension-continue Coreg, Cardizem, lisinopril, Imdur. Monitor  #6 GERD-continue Protonix    Physical therapy consulted  Palliative care c/s due  to recurrent Hospitalization (4th admission) for goals of care discussion, she is DNR    All the records are reviewed and case discussed with Care Management/Social Workerr. Management plans discussed with the patient, family and they are in agreement.  CODE STATUS: DNR  TOTAL TIME TAKING CARE OF THIS PATIENT: 38 minutes.   POSSIBLE D/C IN 1-2 DAYS, DEPENDING ON CLINICAL CONDITION.   Delfino Lovett M.D on 05/05/2017 at 3:07 PM  Between 7am to 6pm - Pager - [phone]  After 6pm go to [email or web address] - Social research officer, government  [facility]  Office  [phone]  CC: Primary care physician; Marisue Ivan, MD

## 2017-05-05 NOTE — Progress Notes (Signed)
Palliative Medicine Team  Due to high volume of referrals, there is a delay seeing this patient. [facility] not at [facility] over the weekend but will arrange goals of care with patient and family on Monday. Thank you for the opportunity to participate in the care of Ms. Pasquarello.   Vennie HomansMegan Theon Sobotka, FNP-C Palliative Medicine Team  Phone: [phone] Fax: [phone]

## 2017-05-05 NOTE — Progress Notes (Signed)
[facility]  Patient Status: Active  [facility] is providing the following services: SN/PT  If patient discharges after hours, please call [phone].   Dimple CaseyJason E Hinton 05/05/2017, 10:33 AM

## 2017-05-05 NOTE — Progress Notes (Signed)
This RN now assuming pnt care. Pnt resting and appears comfortable in bed. Assisted pnt back to bed from bathroom. Pnt tol well no significant increase in resp. or s/sx of resp. distress. Admission complete. Pnt denies any pain or discomfort at this time. pnt is HOH and both hearing aids at bedside in container. Bed rails up x3 and bed alarm on. Bed low, locked and call bell in reach. Will continue to monitor and assess.

## 2017-05-05 NOTE — Evaluation (Signed)
Physical Therapy Evaluation Patient Details Name: Laura [name] MRN: [id number] DOB: [birth year] Today's Date: 05/05/2017   History of Present Illness  Pt is a 83 y.o.femalewith a known history of Atrial fibrillation, CHF, CK D stage IV, hyperlipidemia, hypertension, who presents to the hospital with complaints of shortness of breath, worsening over the past one week, especially bad over the past 2 days, patient admits of wheezing, some dry cough. She also notices few episodes of chest pain in the left side of the chest fleeting, she is not sure if they're related to breathing or exercise. Patient was struggling in emergency room to breathe, had pursed lip breathing, hospitalist services were contacted for admission. Patient's daughter admitted of 12 pound weight gain over the past 3 weeks, 5 pounds In over the past 2-3 days.  EKG in emergency room revealed atrial fibrillation, rate of 94 bpm, left axis deviation, poor R-wave progression, likely anterior infarct, no acute STTchanges.  Assessment includes: Acute on chronic diastolic CHF, CKD stage IV, leukopenia, and LLE swelling with dopplar negative for DVT.     Clinical Impression  Pt presents with deficits in strength, transfers, mobility, gait, balance, and activity tolerance.  Pt was Mod I with bed mobility tasks with extra time/effort required but no physical assistance.  Pt was CGA with transfers with min verbal cues for safe sequencing.  Pt able to amb 50' with RW with CGA with Trendelenburg on LLE and min instability but no LOB.  Pre/post amb vital signs on room air:  SpO2 99%/87% with cues given for pursed-lip breathing with SpO2 returning to >/= 95% in around 30 sec, HR 98/118 bpm.  Pt will benefit from HHPT services upon discharge to address above deficits for decreased caregiver assistance and return to PLOF.       Follow Up Recommendations Home health PT    Equipment Recommendations  None recommended by PT;Other (comment) (PT  recommended to pt to use her RW upon discharge home instead of her rollator)    Recommendations for Other Services       Precautions / Restrictions Precautions Precautions: Fall Restrictions Weight Bearing Restrictions: No      Mobility  Bed Mobility Overal bed mobility: Modified Independent             General bed mobility comments: Extra time and effort required during bed mobility tasks.  Transfers Overall transfer level: Needs assistance Equipment used: Rolling walker (2 wheeled) Transfers: Sit to/from Stand Sit to Stand: Min guard            Ambulation/Gait Ambulation/Gait assistance: Min guard Ambulation Distance (Feet): 50 Feet Assistive device: Rolling walker (2 wheeled) Gait Pattern/deviations: Trendelenburg;Decreased step length - right;Decreased step length - left   Gait velocity interpretation: Below normal speed for age/gender General Gait Details: Trendelenburg on LLE with min instability but no LOB.  Stairs            Wheelchair Mobility    Modified Rankin (Stroke Patients Only)       Balance Overall balance assessment: Needs assistance Sitting-balance support: No upper extremity supported;Feet supported Sitting balance-Leahy Scale: Good     Standing balance support: Bilateral upper extremity supported Standing balance-Leahy Scale: Good                               Pertinent Vitals/Pain Pain Assessment: No/denies pain    Home Living Family/patient expects to be discharged to:: Assisted living  Home Equipment: Walker - 2 wheels;Walker - 4 wheels      Prior Function Level of Independence: Needs assistance   Gait / Transfers Assistance Needed: Pt reports mostly Mod Ind with amb in facility with rollator but secondary to 3 falls in the last few months pt has had a personal care attendent walk with her recently.  ADL's / Homemaking Assistance Needed: ALF provides lunch with pt able to manage  other meals independently.  Pt's daughter fills pill boxes for her.        Hand Dominance        Extremity/Trunk Assessment        Lower Extremity Assessment Lower Extremity Assessment: Generalized weakness       Communication   Communication: No difficulties  Cognition Arousal/Alertness: Awake/alert Behavior During Therapy: WFL for tasks assessed/performed Overall Cognitive Status: Within Functional Limits for tasks assessed                                        General Comments      Exercises Total Joint Exercises Ankle Circles/Pumps: AROM;Both;5 reps;10 reps Quad Sets: Strengthening;Both;5 reps;10 reps Gluteal Sets: Strengthening;Both;10 reps Hip ABduction/ADduction: AAROM;Both;10 reps Straight Leg Raises: AAROM;Both;10 reps Long Arc Quad: AROM;Both;10 reps Knee Flexion: AROM;Both;10 reps Marching in Standing: Other (comment);AROM;Both;10 reps (Marching in sitting)   Assessment/Plan    PT Assessment Patient needs continued PT services  PT Problem List Decreased strength;Decreased activity tolerance;Decreased balance       PT Treatment Interventions DME instruction;Gait training;Functional mobility training;Neuromuscular re-education;Balance training;Therapeutic exercise;Therapeutic activities;Patient/family education    PT Goals (Current goals can be found in the Care Plan section)  Acute Rehab PT Goals Patient Stated Goal: To get back home PT Goal Formulation: With patient Time For Goal Achievement: 05/18/17 Potential to Achieve Goals: Good    Frequency Min 2X/week   Barriers to discharge        Co-evaluation               AM-PAC PT "6 Clicks" Daily Activity  Outcome Measure Difficulty turning over in bed (including adjusting bedclothes, sheets and blankets)?: None Difficulty moving from lying on back to sitting on the side of the bed? : A Little Difficulty sitting down on and standing up from a chair with arms (e.g.,  wheelchair, bedside commode, etc,.)?: Total Help needed moving to and from a bed to chair (including a wheelchair)?: A Little Help needed walking in hospital room?: A Little Help needed climbing 3-5 steps with a railing? : A Lot 6 Click Score: 16    End of Session Equipment Utilized During Treatment: Gait belt Activity Tolerance: Patient limited by fatigue Patient left: with bed alarm set;in bed;with call bell/phone within reach Nurse Communication: Mobility status;Other (comment) (SpO2 and HR pre-post amb) PT Visit Diagnosis: Difficulty in walking, not elsewhere classified (R26.2);Muscle weakness (generalized) (M62.81)    Time: 1610-9604 PT Time Calculation (min) (ACUTE ONLY): 29 min   Charges:   PT Evaluation $PT Eval Low Complexity: 1 Procedure PT Treatments $Therapeutic Exercise: 8-22 mins   PT G Codes:        DElly Modena PT, DPT 05/05/17, 12:36 PM

## 2017-05-05 NOTE — Clinical Social Work Note (Signed)
CSW received consult that patient is from facility.  [facility], case manager aware and can assist with discharge back home with home health.  CSW to sign off, please reconsult if social work needs arise.  Ervin KnackEric R. Hassan Rowannterhaus, MSW, Theresia MajorsLCSWA [phone]  05/05/2017 5:39 PM

## 2017-05-05 NOTE — Discharge Instructions (Signed)
[facility] Failure Clinic appointment on May 17, 2017 at 10:40am with Laura Kindredina Gorman Safi, FNP. Please call [phone] to reschedule.

## 2017-05-06 LAB — CBC
HCT: 24.2 % — ABNORMAL LOW (ref 35.0–47.0)
HEMOGLOBIN: 8.5 g/dL — AB (ref 12.0–16.0)
MCH: 30.9 pg (ref 26.0–34.0)
MCHC: 35 g/dL (ref 32.0–36.0)
MCV: 88.3 fL (ref 80.0–100.0)
Platelets: 170 10*3/uL (ref 150–440)
RBC: 2.74 MIL/uL — AB (ref 3.80–5.20)
RDW: 14.2 % (ref 11.5–14.5)
WBC: 3.1 10*3/uL — AB (ref 3.6–11.0)

## 2017-05-06 LAB — BASIC METABOLIC PANEL
ANION GAP: 9 (ref 5–15)
BUN: 53 mg/dL — ABNORMAL HIGH (ref 6–20)
CHLORIDE: 95 mmol/L — AB (ref 101–111)
CO2: 26 mmol/L (ref 22–32)
Calcium: 8.8 mg/dL — ABNORMAL LOW (ref 8.9–10.3)
Creatinine, Ser: 1.92 mg/dL — ABNORMAL HIGH (ref 0.44–1.00)
GFR calc Af Amer: 25 mL/min — ABNORMAL LOW (ref >60)
GFR, EST NON AFRICAN AMERICAN: 22 mL/min — AB (ref >60)
Glucose, Bld: 88 mg/dL (ref 65–99)
POTASSIUM: 3.9 mmol/L (ref 3.5–5.1)
SODIUM: 130 mmol/L — AB (ref 135–145)

## 2017-05-06 NOTE — Progress Notes (Signed)
Per care mang, Larita FifeLynn, O2 will not be delivered until tomorrow/ MD made aware.

## 2017-05-06 NOTE — Progress Notes (Signed)
[facility] - [place] at [facility]   PATIENT NAME: Laura [name]    MR#:  [id number]  DATE OF BIRTH:  [birth year]  SUBJECTIVE:   Patient here due to shortness of breath and noted to be in acute on chronic CHF.  Improving with IV diuresis but still having some shortness of breath on minimal exertion.   REVIEW OF SYSTEMS:    Review of Systems  Constitutional: Negative for chills and fever.  HENT: Negative for congestion and tinnitus.   Eyes: Negative for blurred vision and double vision.  Respiratory: Positive for shortness of breath. Negative for cough and wheezing.   Cardiovascular: Negative for chest pain, orthopnea and PND.  Gastrointestinal: Negative for abdominal pain, diarrhea, nausea and vomiting.  Genitourinary: Negative for dysuria and hematuria.  Neurological: Negative for dizziness, sensory change and focal weakness.  All other systems reviewed and are negative.   Nutrition: heart healthy Tolerating Diet: Yes Tolerating PT: eval noted     DRUG ALLERGIES:   Allergies  Allergen Reactions  . Penicillins Shortness Of Breath        . Codeine Itching  . Amlodipine Rash    Peripheral edema  . Ciprofloxacin Rash  . Sulfa Antibiotics Rash    VITALS:  Blood pressure 114/90, pulse 92, temperature 97.7 F (36.5 C), temperature source Oral, resp. rate 20, height 4\' 9"  (1.448 m), weight 57.7 kg (127 lb 3.2 oz), SpO2 96 %.  PHYSICAL EXAMINATION:   Physical Exam  GENERAL:  83 y.o.-year-old patient sitting up in bed in no acute distress.  EYES: Pupils equal, round, reactive to light and accommodation. No scleral icterus. Extraocular muscles intact.  HEENT: Head atraumatic, normocephalic. Oropharynx and nasopharynx clear.  NECK:  Supple, no jugular venous distention. No thyroid enlargement, no tenderness.  LUNGS: Normal breath sounds bilaterally, no wheezing, rales, rhonchi. No use of accessory muscles of respiration.  CARDIOVASCULAR: S1, S2  normal. No murmurs, rubs, or gallops.  ABDOMEN: Soft, nontender, nondistended. Bowel sounds present. No organomegaly or mass.  EXTREMITIES: No cyanosis, clubbing or edema b/l.    NEUROLOGIC: Cranial nerves II through XII are intact. No focal Motor or sensory deficits b/l.   PSYCHIATRIC: The patient is alert and oriented x 3.  SKIN: No obvious rash, lesion, or ulcer.    LABORATORY PANEL:   CBC  Recent Labs Lab 05/06/17 0453  WBC 3.1*  HGB 8.5*  HCT 24.2*  PLT 170   ------------------------------------------------------------------------------------------------------------------  Chemistries   Recent Labs Lab 05/04/17 1828  05/06/17 0453  NA 125*  < > 130*  K 4.6  < > 3.9  CL 93*  < > 95*  CO2 24  < > 26  GLUCOSE 112*  < > 88  BUN 52*  < > 53*  CREATININE 2.05*  < > 1.92*  CALCIUM 8.6*  < > 8.8*  AST 20  --   --   ALT 14  --   --   ALKPHOS 88  --   --   BILITOT 0.5  --   --   < > = values in this interval not displayed. ------------------------------------------------------------------------------------------------------------------  Cardiac Enzymes  Recent Labs Lab 05/05/17 1201  TROPONINI <0.03   ------------------------------------------------------------------------------------------------------------------  RADIOLOGY:  Dg Chest 2 View  Result Date: 05/04/2017 CLINICAL DATA:  Increased shortness breath over the past week. EXAM: CHEST  2 VIEW COMPARISON:  04/10/2017. FINDINGS: Stable enlarged cardiac silhouette and tortuous and calcified thoracic aorta. Probably no significant change in prominence of the interstitial markings  when differences in depth of inspiration are taken into account. Linear density in the right mid and lower lung zones and at the left lung base. There is some persistent opacity of the medial right lung base without the mass like appearance previously seen. Small bilateral pleural effusions with improvement. Mild thoracic spine  degenerative changes. Cervical spine degenerative changes. IMPRESSION: 1. Interval linear atelectasis in the right mid and lower lung zones and at the left lung base. 2. Persistent atelectasis or pneumonia at the medial right lung base without the previously seen mass like appearance. 3. Decreased size of small bilateral pleural effusions. 4. Stable cardiomegaly and chronic interstitial lung disease. Electronically Signed   By: Beckie SaltsSteven  Reid M.D.   On: 05/04/2017 18:55   [place] Venous Img Lower Bilateral  Result Date: 05/05/2017 CLINICAL DATA:  Leg swelling EXAM: BILATERAL LOWER EXTREMITY VENOUS DOPPLER ULTRASOUND TECHNIQUE: Gray-scale sonography with graded compression, as well as color Doppler and duplex ultrasound were performed to evaluate the lower extremity deep venous systems from the level of the common femoral vein and including the common femoral, femoral, profunda femoral, popliteal and calf veins including the posterior tibial, peroneal and gastrocnemius veins when visible. The superficial great saphenous vein was also interrogated. Spectral Doppler was utilized to evaluate flow at rest and with distal augmentation maneuvers in the common femoral, femoral and popliteal veins. COMPARISON:  None. FINDINGS: RIGHT LOWER EXTREMITY Common Femoral Vein: No evidence of thrombus. Normal compressibility, respiratory phasicity and response to augmentation. Saphenofemoral Junction: No evidence of thrombus. Normal compressibility and flow on color Doppler imaging. Profunda Femoral Vein: No evidence of thrombus. Normal compressibility and flow on color Doppler imaging. Femoral Vein: No evidence of thrombus. Normal compressibility, respiratory phasicity and response to augmentation. Popliteal Vein: No evidence of thrombus. Normal compressibility, respiratory phasicity and response to augmentation. Calf Veins: No evidence of thrombus. Normal compressibility and flow on color Doppler imaging. Superficial Great Saphenous  Vein: No evidence of thrombus. Normal compressibility and flow on color Doppler imaging. Venous Reflux:  None. Other Findings:  None. LEFT LOWER EXTREMITY Common Femoral Vein: No evidence of thrombus. Normal compressibility, respiratory phasicity and response to augmentation. Saphenofemoral Junction: No evidence of thrombus. Normal compressibility and flow on color Doppler imaging. Profunda Femoral Vein: No evidence of thrombus. Normal compressibility and flow on color Doppler imaging. Femoral Vein: No evidence of thrombus. Normal compressibility, respiratory phasicity and response to augmentation. Popliteal Vein: No evidence of thrombus. Normal compressibility, respiratory phasicity and response to augmentation. Calf Veins: No evidence of thrombus. Normal compressibility and flow on color Doppler imaging. Superficial Great Saphenous Vein: No evidence of thrombus. Normal compressibility and flow on color Doppler imaging. Venous Reflux:  None. Other Findings:  None. IMPRESSION: No evidence of DVT within either lower extremity. Electronically Signed   By: Marlan Palauharles  Clark M.D.   On: 05/05/2017 11:11     ASSESSMENT AND PLAN:   83 year old female with past medical history of hypertension, hyperlipidemia, GERD, chronic kidney disease, CHF, chronic atrial fibrillation who presented to the hospital due to shortness of breath.  1. CHF-acute on chronic diastolic CHF. -Continue diuresis with IV Lasix, follow I's and O's and daily weights. -Continue Cardizem, Coreg, Imdur, lisinopril.  2. History of chronic atrial fibrillation-currently rate controlled. Continue Cardizem. - not on long term anti-coagulation due to advanced age and high fall risk.   3. Essential hypertension-continue Cardizem lisinopril, hydralazine, Coreg. - BP stable.   4. COPD - no acute exacerbation. Cont. Duonebs PRN.   5. Hyperlipidemia -  cont. Simvastatin.   6. Chronic kidney disease stage III-creatinine close to baseline. We'll  follow due to IV diuresis.  Pt Eval noted.   All the records are reviewed and case discussed with Care Management/Social Worker. Management plans discussed with the patient, family and they are in agreement.  CODE STATUS: Full code  DVT Prophylaxis: Hep. SQ  TOTAL TIME TAKING CARE OF THIS PATIENT: 30 minutes.   POSSIBLE D/C IN 1-2 DAYS, DEPENDING ON CLINICAL CONDITION.   Houston Siren M.D on 05/06/2017 at 3:21 PM  Between 7am to 6pm - Pager - [phone]  After 6pm go to [email or web address] - Social research officer, government  [facility] [place] [facility]  Office  [phone]  CC: Primary care physician; Marisue Ivan, MD

## 2017-05-07 ENCOUNTER — Inpatient Hospital Stay: Payer: Medicare Other

## 2017-05-07 LAB — BASIC METABOLIC PANEL
Anion gap: 7 (ref 5–15)
BUN: 51 mg/dL — ABNORMAL HIGH (ref 6–20)
CALCIUM: 8.5 mg/dL — AB (ref 8.9–10.3)
CHLORIDE: 97 mmol/L — AB (ref 101–111)
CO2: 27 mmol/L (ref 22–32)
CREATININE: 2.07 mg/dL — AB (ref 0.44–1.00)
GFR calc Af Amer: 23 mL/min — ABNORMAL LOW (ref >60)
GFR calc non Af Amer: 20 mL/min — ABNORMAL LOW (ref >60)
GLUCOSE: 87 mg/dL (ref 65–99)
Potassium: 3.7 mmol/L (ref 3.5–5.1)
Sodium: 131 mmol/L — ABNORMAL LOW (ref 135–145)

## 2017-05-07 MED ORDER — FUROSEMIDE 10 MG/ML IJ SOLN
40.0000 mg | Freq: Every day | INTRAMUSCULAR | Status: DC
  Administered 2017-05-08: 40 mg via INTRAVENOUS
  Filled 2017-05-07: qty 4

## 2017-05-07 NOTE — Progress Notes (Signed)
Pt. Slept well throughout the night getting up only to use rest room. No signs or c/o SOB or acute distress observed.

## 2017-05-07 NOTE — Progress Notes (Signed)
[facility] -  at [facility]   PATIENT NAME: Laura [name]    MR#:  [id number]  DATE OF BIRTH:  1924/03/31  SUBJECTIVE:   Patient here due to shortness of breath and noted to be in acute on chronic CHF.  Still having significant shortness on exertion and event at rest.  Cr. Trending up and lasix dose adjusted.   REVIEW OF SYSTEMS:    Review of Systems  Constitutional: Negative for chills and fever.  HENT: Negative for congestion and tinnitus.   Eyes: Negative for blurred vision and double vision.  Respiratory: Positive for shortness of breath. Negative for cough and wheezing.   Cardiovascular: Negative for chest pain, orthopnea and PND.  Gastrointestinal: Negative for abdominal pain, diarrhea, nausea and vomiting.  Genitourinary: Negative for dysuria and hematuria.  Neurological: Negative for dizziness, sensory change and focal weakness.  All other systems reviewed and are negative.   Nutrition: heart healthy Tolerating Diet: Yes Tolerating PT: eval noted     DRUG ALLERGIES:   Allergies  Allergen Reactions  . Penicillins Shortness Of Breath        . Codeine Itching  . Amlodipine Rash    Peripheral edema  . Ciprofloxacin Rash  . Sulfa Antibiotics Rash    VITALS:  Blood pressure (!) 119/58, pulse 61, temperature 97.7 F (36.5 C), resp. rate 20, height 4\' 9"  (1.448 m), weight 57.6 kg (127 lb), SpO2 98 %.  PHYSICAL EXAMINATION:   Physical Exam  GENERAL:  83 y.o.-year-old patient lying in bed in no acute distress.  EYES: Pupils equal, round, reactive to light and accommodation. No scleral icterus. Extraocular muscles intact.  HEENT: Head atraumatic, normocephalic. Oropharynx and nasopharynx clear.  NECK:  Supple, no jugular venous distention. No thyroid enlargement, no tenderness.  LUNGS: Good a/e b/l, no wheezing, bibasilar rales, No rhonchi. No use of accessory muscles of respiration.  CARDIOVASCULAR: S1, S2 normal. No murmurs, rubs, or  gallops.  ABDOMEN: Soft, nontender, nondistended. Bowel sounds present. No organomegaly or mass.  EXTREMITIES: No cyanosis, clubbing or edema b/l.    NEUROLOGIC: Cranial nerves II through XII are intact. No focal Motor or sensory deficits b/l.   PSYCHIATRIC: The patient is alert and oriented x 3.  SKIN: No obvious rash, lesion, or ulcer.    LABORATORY PANEL:   CBC  Recent Labs Lab 05/06/17 0453  WBC 3.1*  HGB 8.5*  HCT 24.2*  PLT 170   ------------------------------------------------------------------------------------------------------------------  Chemistries   Recent Labs Lab 05/04/17 1828  05/07/17 0448  NA 125*  < > 131*  K 4.6  < > 3.7  CL 93*  < > 97*  CO2 24  < > 27  GLUCOSE 112*  < > 87  BUN 52*  < > 51*  CREATININE 2.05*  < > 2.07*  CALCIUM 8.6*  < > 8.5*  AST 20  --   --   ALT 14  --   --   ALKPHOS 88  --   --   BILITOT 0.5  --   --   < > = values in this interval not displayed. ------------------------------------------------------------------------------------------------------------------  Cardiac Enzymes  Recent Labs Lab 05/05/17 1201  TROPONINI <0.03   ------------------------------------------------------------------------------------------------------------------  RADIOLOGY:  No results found.   ASSESSMENT AND PLAN:   83 year old female with past medical history of hypertension, hyperlipidemia, GERD, chronic kidney disease, CHF, chronic atrial fibrillation who presented to the hospital due to shortness of breath.  1. Acute respiratory failure with hypoxia-thought to be related  to CHF and being diuresed with IV Lasix but continues to have significant shortness of breath. -We'll get a CT chest noncontrast to further take a look at the lungs. Patient does have a history of possible interstitial lung disease.  2. CHF-acute on chronic diastolic CHF. -Being diuresed with IV Lasix about 2 L (-) since admission.  Cr. Trending up and will  reduce lasix to daily.  - follow I's and O's and daily weights. -Continue Cardizem, Coreg, Imdur, lisinopril.   3. History of chronic atrial fibrillation-currently rate controlled. Continue Cardizem. - not on long term anti-coagulation due to advanced age and high fall risk.   4. Essential hypertension-continue Cardizem lisinopril, hydralazine, Coreg. - BP stable.   5. COPD - no acute exacerbation. Cont. Duonebs PRN.   6. Hyperlipidemia - cont. Simvastatin.   7. Chronic kidney disease stage III-creatinine close to baseline. Baseline Cr. Close to 2 and will cont. To monitor.    Pt Eval noted.   All the records are reviewed and case discussed with Care Management/Social Worker. Management plans discussed with the patient, family and they are in agreement.  CODE STATUS: Full code  DVT Prophylaxis: Hep. SQ  TOTAL TIME TAKING CARE OF THIS PATIENT: 30 minutes.   POSSIBLE D/C IN 1-2 DAYS, DEPENDING ON CLINICAL CONDITION.   [place] SirenSAINANI,Phyllis Whitefield J M.D on 05/07/2017 at 1:33 PM  Between 7am to 6pm - Pager - [phone]  After 6pm go to [email or web address] - Social research officer, governmentpassword [employer]AS [email or web address]  [facility] [place] [facility]  Office  [phone]  CC: Primary care physician; Marisue IvanLinthavong, [place], MD

## 2017-05-08 DIAGNOSIS — I509 Heart failure, unspecified: Secondary | ICD-10-CM

## 2017-05-08 DIAGNOSIS — M7989 Other specified soft tissue disorders: Secondary | ICD-10-CM

## 2017-05-08 DIAGNOSIS — Z515 Encounter for palliative care: Secondary | ICD-10-CM

## 2017-05-08 DIAGNOSIS — Z66 Do not resuscitate: Secondary | ICD-10-CM

## 2017-05-08 DIAGNOSIS — N184 Chronic kidney disease, stage 4 (severe): Secondary | ICD-10-CM

## 2017-05-08 DIAGNOSIS — R0602 Shortness of breath: Secondary | ICD-10-CM

## 2017-05-08 LAB — BASIC METABOLIC PANEL
ANION GAP: 7 (ref 5–15)
BUN: 53 mg/dL — AB (ref 6–20)
CALCIUM: 8.4 mg/dL — AB (ref 8.9–10.3)
CO2: 26 mmol/L (ref 22–32)
Chloride: 93 mmol/L — ABNORMAL LOW (ref 101–111)
Creatinine, Ser: 1.99 mg/dL — ABNORMAL HIGH (ref 0.44–1.00)
GFR calc Af Amer: 24 mL/min — ABNORMAL LOW (ref >60)
GFR, EST NON AFRICAN AMERICAN: 21 mL/min — AB (ref >60)
Glucose, Bld: 83 mg/dL (ref 65–99)
Potassium: 4.1 mmol/L (ref 3.5–5.1)
Sodium: 126 mmol/L — ABNORMAL LOW (ref 135–145)

## 2017-05-08 LAB — OSMOLALITY, URINE: Osmolality, Ur: 255 mOsm/kg — ABNORMAL LOW (ref 300–900)

## 2017-05-08 LAB — SODIUM, URINE, RANDOM: Sodium, Ur: 58 mmol/L

## 2017-05-08 MED ORDER — TOLVAPTAN 15 MG PO TABS
15.0000 mg | ORAL_TABLET | Freq: Once | ORAL | Status: AC
  Administered 2017-05-08: 15 mg via ORAL
  Filled 2017-05-08: qty 1

## 2017-05-08 MED ORDER — FUROSEMIDE 10 MG/ML IJ SOLN
40.0000 mg | Freq: Three times a day (TID) | INTRAMUSCULAR | Status: DC
  Administered 2017-05-08 – 2017-05-09 (×3): 40 mg via INTRAVENOUS
  Filled 2017-05-08: qty 160
  Filled 2017-05-08 (×3): qty 4
  Filled 2017-05-08 (×2): qty 160
  Filled 2017-05-08: qty 4

## 2017-05-08 NOTE — Progress Notes (Signed)
Physical Therapy Treatment Patient Details Name: Laura [name] N Dupree MRN: [id number] DOB: Oct 26, 1924 Today's Date: 05/08/2017    History of Present Illness Pt is a 83 y.o.femalewith a known history of Atrial fibrillation, CHF, CK D stage IV, hyperlipidemia, hypertension, who presents to the hospital with complaints of shortness of breath, worsening over the past one week, especially bad over the past 2 days, patient admits of wheezing, some dry cough. She also notices few episodes of chest pain in the left side of the chest fleeting, she is not sure if they're related to breathing or exercise. Patient was struggling in emergency room to breathe, had pursed lip breathing, hospitalist services were contacted for admission. Patient's daughter admitted of 12 pound weight gain over the past 3 weeks, 5 pounds In over the past 2-3 days.  EKG in emergency room revealed atrial fibrillation, rate of 94 bpm, left axis deviation, poor R-wave progression, likely anterior infarct, no acute STTchanges.  Assessment includes: Acute on chronic diastolic CHF, CKD stage IV, leukopenia, and LLE swelling with dopplar negative for DVT.     PT Comments    Pt presents with mild deficits in strength, transfers, gait, and balance and moderate deficits in activity tolerance. Pt Mod I with bed mobility tasks with extra time and effort required.  Pt CGA with transfers and able to ambulate 6750' with RW and CGA with slow cadence and mod verbal cues for amb closer to RW.  SpO2 on room air dropped from 97% to 96% after ambulation with minimal SOB.  HR increased from 98 bpm to 110 bpm after amb.  Pt c/o minimal dizziness upon transitioning from sup to sit with BP taken at 145/89 mmHg.  Dizziness quickly resolved while in sitting and did not return during session.  Pt will benefit from HHPT services to address above deficits for decreased caregiver assistance upon discharge.     Follow Up Recommendations  Home health PT     Equipment  Recommendations  None recommended by PT    Recommendations for Other Services       Precautions / Restrictions Precautions Precautions: Fall Restrictions Weight Bearing Restrictions: No    Mobility  Bed Mobility Overal bed mobility: Modified Independent             General bed mobility comments: Extra time and effort required during bed mobility tasks.  Transfers Overall transfer level: Needs assistance Equipment used: Rolling walker (2 wheeled) Transfers: Sit to/from Stand Sit to Stand: Min guard            Ambulation/Gait Ambulation/Gait assistance: Min guard Ambulation Distance (Feet): 50 Feet Assistive device: Rolling walker (2 wheeled) Gait Pattern/deviations: Trendelenburg;Decreased step length - right;Decreased step length - left   Gait velocity interpretation: Below normal speed for age/gender General Gait Details: Trendelenburg on LLE with min instability but no LOB.   Stairs            Wheelchair Mobility    Modified Rankin (Stroke Patients Only)       Balance Overall balance assessment: Needs assistance Sitting-balance support: No upper extremity supported;Feet supported Sitting balance-Leahy Scale: Good     Standing balance support: Bilateral upper extremity supported Standing balance-Leahy Scale: Good                              Cognition Arousal/Alertness: Awake/alert Behavior During Therapy: WFL for tasks assessed/performed Overall Cognitive Status: Within Functional Limits for tasks assessed  Exercises Total Joint Exercises Ankle Circles/Pumps: Strengthening;Both;10 reps;5 reps Quad Sets: Strengthening;Both;5 reps;10 reps Gluteal Sets: Strengthening;Both;5 reps;10 reps Heel Slides: AROM;Both;10 reps Hip ABduction/ADduction: AROM;Both;10 reps Straight Leg Raises: AAROM;Both;10 reps Long Arc Quad: Strengthening;Both;10 reps Marching in Standing:  AROM;Both;5 reps;10 reps    General Comments        Pertinent Vitals/Pain Pain Assessment: No/denies pain    Home Living                      Prior Function            PT Goals (current goals can now be found in the care plan section) Acute Rehab PT Goals Patient Stated Goal: To get back home PT Goal Formulation: With patient Time For Goal Achievement: 05/18/17 Progress towards PT goals: Progressing toward goals    Frequency    Min 2X/week      PT Plan Current plan remains appropriate    Co-evaluation              AM-PAC PT "6 Clicks" Daily Activity  Outcome Measure  Difficulty turning over in bed (including adjusting bedclothes, sheets and blankets)?: None Difficulty moving from lying on back to sitting on the side of the bed? : A Little Difficulty sitting down on and standing up from a chair with arms (e.g., wheelchair, bedside commode, etc,.)?: Total Help needed moving to and from a bed to chair (including a wheelchair)?: A Little Help needed walking in hospital room?: A Little Help needed climbing 3-5 steps with a railing? : A Lot 6 Click Score: 16    End of Session Equipment Utilized During Treatment: Gait belt Activity Tolerance: Patient limited by fatigue Patient left: in bed;with bed alarm set;with family/visitor present;with call bell/phone within reach Nurse Communication: Mobility status;Other (comment) (Vital signs during session) PT Visit Diagnosis: Difficulty in walking, not elsewhere classified (R26.2);Muscle weakness (generalized) (M62.81)     Time: 1100 (MD present speaking with pt for 8 min)-1133 PT Time Calculation (min) (ACUTE ONLY): 33 min  Charges:  $Gait Training: 8-22 mins $Therapeutic Exercise: 8-22 mins                    G Codes:       DElly Modena PT, DPT 05/08/17, 11:56 AM

## 2017-05-08 NOTE — Consult Note (Signed)
Consultation Note Date: 05/08/2017   Patient Name: Laura [name]  DOB: [birth year]  MRN: [id number]  Age / Sex: 83 y.o., female  PCP: Marisue Ivan, MD Referring Physician: Houston Siren, MD  Reason for Consultation: Establishing goals of care and Psychosocial/spiritual support  HPI/Patient Profile: 83 y.o. female  admitted on 05/04/2017 with a known history of Atrial fibrillation, CHF, CKD stage IV, hyperlipidemia, hypertension, who presents to the hospital with complaints of shortness of breath, worsening over the past one week, with   wheezing, some dry cough.  Patient's daughter admitted of 12 pound weight gain over the past 3 weeks, 5 pounds In over the past 2-3 days.  Continued physical and functional decline over the past several months.   Multiple re hospitalizations over the past 4 months for similar episodes.  Patient has good assistance at home and tight compliance with treatment plan.  Patient and her family face advanced care decisions and anticipatory care needs   Clinical Assessment and Goals of Care:   This NP Lorinda Creed reviewed medical records, received report from team, assessed the patient and then meet at the patient's bedside with her son and daughter   to discuss diagnosis, prognosis, GOC, EOL wishes disposition and options.  A detailed discussion was had today regarding advanced directives.  Concepts specific to code status, artifical feeding and hydration, continued IV antibiotics and rehospitalization was had.  The difference between a aggressive medical intervention path  and a palliative comfort care path for this patient at this time was had.  Values and goals of care important to patient and family were attempted to be elicited.  MOST form introduced  Concept of Hospice and Palliative Care were discussed  Natural trajectory and expectations at EOL were discussed.   Questions and concerns addressed.   Family encouraged to call with questions or concerns.  PMT will continue to support holistically.   NEXT OF KIN/daughter is main support and deciion maker with support of her siblings    SUMMARY OF RECOMMENDATIONS    Code Status/Advance Care Planning:  DNR   Symptom Management:   Dyspnea/ fluid overload: tight management of chronic disease  Palliative Prophylaxis:   Aspiration, Bowel Regimen, Delirium Protocol and Oral Care  Additional Recommendations (Limitations, Scope, Preferences):  Full Scope Treatment  Psycho-social/Spiritual:   Desire for further Chaplaincy support:no  Additional Recommendations: Education on Hospice  Prognosis:   < 6 months, consider hospice referral on discharge   Discharge Planning: She currently lives at [facility] [place], family has secured intermittent help during the day but  likely she will need more asistance    To Be Determined      Primary Diagnoses: Present on Admission: **None**   I have reviewed the medical record, interviewed the patient and family, and examined the patient. The following aspects are pertinent.  Past Medical History:  Diagnosis Date  . Anemia   . Arrhythmia    atrial fibrillation  . Atrial fibrillation (HCC)   . CHF (congestive heart failure) (HCC)   .  Chronic kidney disease   . GERD (gastroesophageal reflux disease)   . GI bleed   . High cholesterol   . Hypertension   . Hyponatremia   . Macular degeneration   . SVT (supraventricular tachycardia) (HCC)    Social History   Social History  . Marital status: Widowed    Spouse name: N/A  . Number of children: N/A  . Years of education: N/A   Social History Main Topics  . Smoking status: Never Smoker  . Smokeless tobacco: Never Used  . Alcohol use No  . Drug use: No  . Sexual activity: Not Asked   Other Topics Concern  . None   Social History Narrative  . None   Family History  Problem Relation Age  of Onset  . Hypertension Mother   . CAD Father   . Heart disease Father   . Arthritis Father   . Pancreatic cancer Son   . Heart failure Sister   . Diabetes Brother   . Heart failure Sister   . Diabetes Son    Scheduled Meds: . acetaminophen  1,000 mg Oral Daily  . aspirin EC  81 mg Oral Daily  . carvedilol  25 mg Oral BID WC  . diltiazem  240 mg Oral Daily  . docusate sodium  100 mg Oral BID  . ferrous gluconate  324 mg Oral BID  . fluticasone  2 spray Each Nare Daily  . furosemide  40 mg Intravenous Daily  . heparin  5,000 Units Subcutaneous Q8H  . hydrALAZINE  100 mg Oral BID  . isosorbide mononitrate  120 mg Oral Daily  . lisinopril  10 mg Oral Daily  . mometasone-formoterol  2 puff Inhalation BID  . pantoprazole  40 mg Oral Daily  . simvastatin  40 mg Oral q1800  . sodium chloride flush  3 mL Intravenous Q12H   Continuous Infusions: . sodium chloride     PRN Meds:.sodium chloride, ipratropium-albuterol, ondansetron **OR** ondansetron (ZOFRAN) IV, sodium chloride flush, traMADol Medications Prior to Admission:  Prior to Admission medications   Medication Sig Start Date End Date Taking? Authorizing Provider  acetaminophen (TYLENOL) 500 MG tablet Take 1,000 mg by mouth daily. Take 2 tabs (1000mg ) by mouth every morning.  Take 2 tablets in evening if needed.   Yes [provider]  aspirin EC 81 MG tablet Take 81 mg by mouth daily.   Yes [provider]  carvedilol (COREG) 25 MG tablet Take 1 tablet (25 mg total) by mouth 2 (two) times daily with a meal. 02/27/17  Yes Mody, Sital, MD  diltiazem (CARDIZEM CD) 240 MG 24 hr capsule Take 1 capsule (240 mg total) by mouth daily. 02/28/17  Yes Mody, Patricia PesaSital, MD  docusate sodium (COLACE) 100 MG capsule Take 100 mg by mouth 2 (two) times daily. 01/02/17  Yes [provider]  Ferrous Gluconate 324 (37.5 Fe) MG TABS Take 1 tablet by mouth 2 (two) times daily. 11/15/16  Yes [provider]  fluticasone  (FLONASE) 50 MCG/ACT nasal spray Place 2 sprays into both nostrils daily.   Yes [provider]  hydrALAZINE (APRESOLINE) 100 MG tablet Take 100 mg by mouth 2 (two) times daily. 12/23/16  Yes [provider]  ipratropium-albuterol (DUONEB) 0.5-2.5 (3) MG/3ML SOLN Take 3 mLs by nebulization every 6 (six) hours as needed. 04/10/17  Yes Enid BaasKalisetti, Radhika, MD  isosorbide mononitrate (IMDUR) 120 MG 24 hr tablet Take 1 tablet by mouth daily. 12/23/16  Yes [provider]  lisinopril (PRINIVIL,ZESTRIL) 10 MG tablet Take 10 mg by mouth daily. 11/15/16  Yes [provider]  Lutein 20 MG TABS Take 1 tablet by mouth daily.   Yes [provider]  mometasone-formoterol (DULERA) 200-5 MCG/ACT AERO Inhale 2 puffs into the lungs 2 (two) times daily. 04/10/17  Yes Enid Baas, MD  Multiple Vitamins-Minerals (PRESERVISION AREDS PO) Take 1 tablet by mouth daily.    Yes [provider]  pantoprazole (PROTONIX) 40 MG tablet Take 40 mg by mouth daily. 12/23/16  Yes [provider]  simvastatin (ZOCOR) 40 MG tablet Take 40 mg by mouth daily at 6 PM.  12/23/16  Yes [provider]  torsemide (DEMADEX) 20 MG tablet Take 1 tablet (20 mg total) by mouth daily. Take another 20mg  in the afternoon if feeling short of breath. 04/10/17  Yes Enid Baas, MD  traMADol (ULTRAM) 50 MG tablet Take 50 mg by mouth every 4 (four) hours as needed.   Yes [provider]   Allergies  Allergen Reactions  . Penicillins Shortness Of Breath        . Codeine Itching  . Amlodipine Rash    Peripheral edema  . Ciprofloxacin Rash  . Sulfa Antibiotics Rash   Review of Systems  Constitutional: Positive for fatigue.  Respiratory: Positive for shortness of breath.   Neurological: Positive for weakness.    Physical Exam  Constitutional: She appears ill.  -weak and frail elderly female  Cardiovascular: Tachycardia present.   BLE +1 edema     Pulmonary/Chest: Tachypnea noted. She has decreased breath sounds in the right lower field and the left lower field.  Abdominal:  - noted distention, tympanic  Skin: Skin is warm and dry.  Psychiatric: She has a normal mood and affect. Her speech is normal and behavior is normal. Thought content normal. Cognition and memory are normal.    Vital Signs: BP 128/63   Pulse (!) 112   Temp 98.9 F (37.2 C) (Oral)   Resp (!) 22   Ht 4\' 9"  (1.448 m)   Wt 56.8 kg (125 lb 3 oz)   SpO2 99%   BMI 27.09 kg/m  Pain Assessment: No/denies pain POSS *See Group Information*: 1-Acceptable,Awake and alert Pain Score: 0-No pain   SpO2: SpO2: 99 % O2 Device:SpO2: 99 % O2 Flow Rate: .O2 Flow Rate (L/min): 2 L/min  IO: Intake/output summary:  Intake/Output Summary (Last 24 hours) at 05/08/17 1006 Last data filed at 05/08/17 9604  Gross per 24 hour  Intake              480 ml  Output             1900 ml  Net            -1420 ml    LBM: Last BM Date: 05/08/17 Baseline Weight: Weight: 58.1 kg (128 lb) Most recent weight: Weight: 56.8 kg (125 lb 3 oz)      Palliative Assessment/Data: 30 %   Flowsheet Rows     Most Recent Value  Intake Tab  Referral Department  Hospitalist  Unit at Time of Referral  Cardiac/Telemetry Unit  Palliative Care Primary Diagnosis  Cardiac  Date Notified  05/05/17  Palliative Care Type  New Palliative care  Reason for referral  Clarify Goals of Care  Date of Admission  05/04/17  # of days IP prior to Palliative referral  1  Clinical Assessment  Psychosocial & Spiritual Assessment  Palliative Care Outcomes  Discussed with Dr Cherlynn Kaiser  Time In: 0940 Time Out: 1055 Time Total: 75 min Greater than 50%  of this time was spent counseling and coordinating care related to the above assessment and plan.  Signed by: Lorinda Creed, NP   Please contact Palliative Medicine Team phone at [phone] for questions and concerns.  For individual provider: See  Loretha Stapler

## 2017-05-08 NOTE — Progress Notes (Signed)
Pt's NA is 126 this A.M., Dr. Sheryle Hailiamond aware. No new orders at this time.

## 2017-05-08 NOTE — Consult Note (Signed)
[facility] Cardiology  CARDIOLOGY CONSULT NOTE  Patient ID: Laura [name] MRN: [id number] DOB/AGE: [birth year] 83 y.o.  Admit date: 05/04/2017 Referring Physician [facility]  Primary Physician Marisue Ivan, MD  Primary Cardiologist None currently per patient Reason for Consultation Acute on chronic diastolic CHF, pulmonary hypertension  HPI: 83 year old female referred for acute on chronic diastolic CHF and pulmonary hypertension. The patient has a history of chronic atrial fibrillation, not on chronic anticoagulation due to advanced age and history of GI bleed, chronic kidney disease, stage IV, hyponatremia, hyperlipidemia and hypertension. The patient reports a one-week history of worsening shortness of breath with wheezing and a dry cough, as well as weight gain. Admission labs notable for troponin <0.03, sodium 130, creatinine 1.94. Chest CT revealed mild interstitial pulmonary edema, moderate right and small left pleural effusions severe coronary artery calcification and enlarged main pulmonary artery that may represent pulmonary artery hypertension. ECG revealed atrial fibrillation at a rate of 94 bpm with possible anterior infarct. Currently, the patient reports feeling much better. She has exertional shortness of breath. She denies chest pain, palpitations, or heart racing. She reports improvement in peripheral edema.  Review of systems complete and found to be negative unless listed above     Past Medical History:  Diagnosis Date  . Anemia   . Arrhythmia    atrial fibrillation  . Atrial fibrillation (HCC)   . CHF (congestive heart failure) (HCC)   . Chronic kidney disease   . GERD (gastroesophageal reflux disease)   . GI bleed   . High cholesterol   . Hypertension   . Hyponatremia   . Macular degeneration   . SVT (supraventricular tachycardia) (HCC)     Past Surgical History:  Procedure Laterality Date  . ABDOMINAL HYSTERECTOMY    . JOINT REPLACEMENT    . REPLACEMENT  TOTAL KNEE BILATERAL    . VAGINAL HYSTERECTOMY     Partial    Prescriptions Prior to Admission  Medication Sig Dispense Refill Last Dose  . acetaminophen (TYLENOL) 500 MG tablet Take 1,000 mg by mouth daily. Take 2 tabs (1000mg ) by mouth every morning.  Take 2 tablets in evening if needed.   Taking  . aspirin EC 81 MG tablet Take 81 mg by mouth daily.   Taking  . carvedilol (COREG) 25 MG tablet Take 1 tablet (25 mg total) by mouth 2 (two) times daily with a meal. 60 tablet 0 05/04/2017 at Unknown time  . diltiazem (CARDIZEM CD) 240 MG 24 hr capsule Take 1 capsule (240 mg total) by mouth daily. 30 capsule 0 05/04/2017 at Unknown time  . docusate sodium (COLACE) 100 MG capsule Take 100 mg by mouth 2 (two) times daily.   Taking  . Ferrous Gluconate 324 (37.5 Fe) MG TABS Take 1 tablet by mouth 2 (two) times daily.   Taking  . fluticasone (FLONASE) 50 MCG/ACT nasal spray Place 2 sprays into both nostrils daily.   Taking  . hydrALAZINE (APRESOLINE) 100 MG tablet Take 100 mg by mouth 2 (two) times daily.   Taking  . ipratropium-albuterol (DUONEB) 0.5-2.5 (3) MG/3ML SOLN Take 3 mLs by nebulization every 6 (six) hours as needed. 360 mL 1 Taking  . isosorbide mononitrate (IMDUR) 120 MG 24 hr tablet Take 1 tablet by mouth daily.   Taking  . lisinopril (PRINIVIL,ZESTRIL) 10 MG tablet Take 10 mg by mouth daily.   Taking  . Lutein 20 MG TABS Take 1 tablet by mouth daily.   Taking  . mometasone-formoterol (DULERA) 200-5  MCG/ACT AERO Inhale 2 puffs into the lungs 2 (two) times daily. 1 Inhaler 2 Taking  . Multiple Vitamins-Minerals (PRESERVISION AREDS PO) Take 1 tablet by mouth daily.    Taking  . pantoprazole (PROTONIX) 40 MG tablet Take 40 mg by mouth daily.   Taking  . simvastatin (ZOCOR) 40 MG tablet Take 40 mg by mouth daily at 6 PM.    Taking  . torsemide (DEMADEX) 20 MG tablet Take 1 tablet (20 mg total) by mouth daily. Take another 20mg  in the afternoon if feeling short of breath. 30 tablet 2 Taking  .  traMADol (ULTRAM) 50 MG tablet Take 50 mg by mouth every 4 (four) hours as needed.   Taking   Social History   Social History  . Marital status: Widowed    Spouse name: N/A  . Number of children: N/A  . Years of education: N/A   Occupational History  . Not on file.   Social History Main Topics  . Smoking status: Never Smoker  . Smokeless tobacco: Never Used  . Alcohol use No  . Drug use: No  . Sexual activity: Not on file   Other Topics Concern  . Not on file   Social History Narrative  . No narrative on file    Family History  Problem Relation Age of Onset  . Hypertension Mother   . CAD Father   . Heart disease Father   . Arthritis Father   . Pancreatic cancer Son   . Heart failure Sister   . Diabetes Brother   . Heart failure Sister   . Diabetes Son       Review of systems complete and found to be negative unless listed above      PHYSICAL EXAM  General: Well developed, well nourished, in no acute distress HEENT:  Normocephalic and atramatic Neck:  No JVD.  Lungs: Slight increased effort of breathing; no retractions. No crackles, some wheezing. Absent right lower lobe breath sounds Heart: Irregularly irregular. 1-2/6 systolic murmur Abdomen: Bowel sounds are positive, abdomen soft and non-tender  Extremities: No clubbing, cyanosis or edema.   Neuro: Alert and oriented X 3. Psych:  Good affect, responds appropriately  Labs:   Lab Results  Component Value Date   WBC 3.1 (L) 05/06/2017   HGB 8.5 (L) 05/06/2017   HCT 24.2 (L) 05/06/2017   MCV 88.3 05/06/2017   PLT 170 05/06/2017    Recent Labs Lab 05/04/17 1828  05/08/17 0508  NA 125*  < > 126*  K 4.6  < > 4.1  CL 93*  < > 93*  CO2 24  < > 26  BUN 52*  < > 53*  CREATININE 2.05*  < > 1.99*  CALCIUM 8.6*  < > 8.4*  PROT 5.7*  --   --   BILITOT 0.5  --   --   ALKPHOS 88  --   --   ALT 14  --   --   AST 20  --   --   GLUCOSE 112*  < > 83  < > = values in this interval not displayed. Lab  Results  Component Value Date   CKTOTAL 98 09/02/2014   CKMB 38.5 (H) 09/02/2014   TROPONINI <0.03 05/05/2017    Lab Results  Component Value Date   CHOL 103 03/18/2017   CHOL 137 09/03/2014   CHOL 131 07/23/2014   Lab Results  Component Value Date   HDL 53 03/18/2017   HDL 38 (  L) 09/03/2014   HDL 39 (L) 07/23/2014   Lab Results  Component Value Date   LDLCALC 38 03/18/2017   LDLCALC 78 09/03/2014   LDLCALC 76 07/23/2014   Lab Results  Component Value Date   TRIG 62 03/18/2017   TRIG 106 09/03/2014   TRIG 79 07/23/2014   Lab Results  Component Value Date   CHOLHDL 1.9 03/18/2017   No results found for: LDLDIRECT    Radiology: Dg Chest 2 View  Result Date: 05/04/2017 CLINICAL DATA:  Increased shortness breath over the past week. EXAM: CHEST  2 VIEW COMPARISON:  04/10/2017. FINDINGS: Stable enlarged cardiac silhouette and tortuous and calcified thoracic aorta. Probably no significant change in prominence of the interstitial markings when differences in depth of inspiration are taken into account. Linear density in the right mid and lower lung zones and at the left lung base. There is some persistent opacity of the medial right lung base without the mass like appearance previously seen. Small bilateral pleural effusions with improvement. Mild thoracic spine degenerative changes. Cervical spine degenerative changes. IMPRESSION: 1. Interval linear atelectasis in the right mid and lower lung zones and at the left lung base. 2. Persistent atelectasis or pneumonia at the medial right lung base without the previously seen mass like appearance. 3. Decreased size of small bilateral pleural effusions. 4. Stable cardiomegaly and chronic interstitial lung disease. Electronically Signed   By: Beckie Salts M.D.   On: 05/04/2017 18:55   Dg Chest 2 View  Result Date: 04/10/2017 CLINICAL DATA:  83 year old female with history of low sodium, weakness and confusion. EXAM: CHEST  2 VIEW  COMPARISON:  Chest x-ray 04/08/2017. FINDINGS: Focal mass-like consolidative opacity in the medial aspect of the right lower lobe concerning for pneumonia. Small bilateral pleural effusions. No evidence of pulmonary edema. Heart size is mildly enlarged. The patient is rotated to the right on today's exam, resulting in distortion of the mediastinal contours and reduced diagnostic sensitivity and specificity for mediastinal pathology. Aortic atherosclerosis. IMPRESSION: 1. Mass-like area of consolidation in the medial aspect of the right lower lobe concerning for pneumonia. Followup PA and lateral chest X-ray is recommended in 3-4 weeks following trial of antibiotic therapy to ensure resolution and exclude underlying malignancy. 2. Small bilateral pleural effusions. 3. Mild cardiomegaly. 4. Aortic atherosclerosis. Electronically Signed   By: Trudie Reed M.D.   On: 04/10/2017 07:50   Ct Chest Wo Contrast  Result Date: 05/07/2017 CLINICAL DATA:  83 y/o F; shortness of breath. Chronic heart failure. EXAM: CT CHEST WITHOUT CONTRAST TECHNIQUE: Multidetector CT imaging of the chest was performed following the standard protocol without IV contrast. COMPARISON:  08/01/2014 chest CT. FINDINGS: Cardiovascular: Normal caliber thoracic aorta with moderate calcific atherosclerosis. Enlarged main pulmonary artery measuring up to 3.8 cm. Severe coronary artery calcification. Mild cardiomegaly. No pericardial effusion. Mediastinum/Nodes: No enlarged mediastinal or axillary lymph nodes. Multiple calcified thyroid nodules. Lungs/Pleura: Smooth interlobular septal thickening compatible with interstitial pulmonary edema. Moderate right and small left pleural effusions. Right infrahilar and lung base opacity probably represents compressive atelectasis. Pneumonia is not excluded. Upper Abdomen: Subcentimeter hyperdense nodules within the left upper pole of the kidney compatible with hemorrhagic cysts. Chest wall edema.  Musculoskeletal: Severe dextrocurvature at the thoracolumbar junction. Multiple chronic bilateral rib fractures. Left 4, 5, 6, and seventh lateral rib fractures with callus formation new from prior chest CT compatible with interval chronic fracture. IMPRESSION: 1. Mild interstitial pulmonary edema. 2. Moderate right and small left pleural effusions. 3. Right  infrahilar and lung base opacity probably represents compressive atelectasis. Pneumonia is not excluded. 4. Mild cardiomegaly and severe coronary artery calcification. 5. Enlarged main pulmonary artery may represent pulmonary artery hypertension. Electronically Signed   By: Mitzi Hansen M.D.   On: 05/07/2017 15:57   US Venous Img Lower Bilateral  Result Date: 05/05/2017 CLINICAL DATA:  Leg swelling EXAM: BILATERAL LOWER EXTREMITY VENOUS DOPPLER ULTRASOUND TECHNIQUE: Gray-scale sonography with graded compression, as well as color Doppler and duplex ultrasound were performed to evaluate the lower extremity deep venous systems from the level of the common femoral vein and including the common femoral, femoral, profunda femoral, popliteal and calf veins including the posterior tibial, peroneal and gastrocnemius veins when visible. The superficial great saphenous vein was also interrogated. Spectral Doppler was utilized to evaluate flow at rest and with distal augmentation maneuvers in the common femoral, femoral and popliteal veins. COMPARISON:  None. FINDINGS: RIGHT LOWER EXTREMITY Common Femoral Vein: No evidence of thrombus. Normal compressibility, respiratory phasicity and response to augmentation. Saphenofemoral Junction: No evidence of thrombus. Normal compressibility and flow on color Doppler imaging. Profunda Femoral Vein: No evidence of thrombus. Normal compressibility and flow on color Doppler imaging. Femoral Vein: No evidence of thrombus. Normal compressibility, respiratory phasicity and response to augmentation. Popliteal Vein: No  evidence of thrombus. Normal compressibility, respiratory phasicity and response to augmentation. Calf Veins: No evidence of thrombus. Normal compressibility and flow on color Doppler imaging. Superficial Great Saphenous Vein: No evidence of thrombus. Normal compressibility and flow on color Doppler imaging. Venous Reflux:  None. Other Findings:  None. LEFT LOWER EXTREMITY Common Femoral Vein: No evidence of thrombus. Normal compressibility, respiratory phasicity and response to augmentation. Saphenofemoral Junction: No evidence of thrombus. Normal compressibility and flow on color Doppler imaging. Profunda Femoral Vein: No evidence of thrombus. Normal compressibility and flow on color Doppler imaging. Femoral Vein: No evidence of thrombus. Normal compressibility, respiratory phasicity and response to augmentation. Popliteal Vein: No evidence of thrombus. Normal compressibility, respiratory phasicity and response to augmentation. Calf Veins: No evidence of thrombus. Normal compressibility and flow on color Doppler imaging. Superficial Great Saphenous Vein: No evidence of thrombus. Normal compressibility and flow on color Doppler imaging. Venous Reflux:  None. Other Findings:  None. IMPRESSION: No evidence of DVT within either lower extremity. Electronically Signed   By: Marlan Palau M.D.   On: 05/05/2017 11:11    EKG: Atrial fibrillation, 89 bpm  ASSESSMENT AND PLAN:  1. Acute on chronic diastolic heart failure, receiving IV Lasix, continues to have exertional shortness of breath. 2D echocardiogram 04/09/17 revealed LVEF 60-65% 2. Chronic kidney disease stage III 3. Chronic atrial fibrillation, not on chronic anticoagulation due to advanced age, history of GI bleed, and falls risk; rate controlled 3. Hyponatremia 4. Pulmonary hypertension, enlarged main pulmonary artery  5. Hypertension   Recommendations: 1. Continue current therapy. 2. Gentle diuresis with careful monitoring of renal status and  sodium 3. Further recommendations pending patient's initial course   Signed: Leanora Ivanoff, Cordelia Poche 05/08/2017, 1:53 PM

## 2017-05-08 NOTE — Progress Notes (Signed)
[facility] [place] at [facility]   PATIENT NAME: Laura [name]    MR#:  [id number]  DATE OF BIRTH:  Aug 16, 1924  SUBJECTIVE:   Patient here due to shortness of breath and noted to be in acute on chronic CHF.  Shortness of breath at rest improved but still has abdominal bloating and shortness of breath on exertion. Son at bedside.    REVIEW OF SYSTEMS:    Review of Systems  Constitutional: Negative for chills and fever.  HENT: Negative for congestion and tinnitus.   Eyes: Negative for blurred vision and double vision.  Respiratory: Positive for shortness of breath. Negative for cough and wheezing.   Cardiovascular: Negative for chest pain, orthopnea and PND.  Gastrointestinal: Negative for abdominal pain, diarrhea, nausea and vomiting.  Genitourinary: Negative for dysuria and hematuria.  Neurological: Negative for dizziness, sensory change and focal weakness.  All other systems reviewed and are negative.   Nutrition: heart healthy Tolerating Diet: Yes Tolerating PT: eval noted  DRUG ALLERGIES:   Allergies  Allergen Reactions  . Penicillins Shortness Of Breath        . Codeine Itching  . Amlodipine Rash    Peripheral edema  . Ciprofloxacin Rash  . Sulfa Antibiotics Rash    VITALS:  Blood pressure (!) 145/89, pulse 98, temperature 98.9 F (37.2 C), temperature source Oral, resp. rate (!) 22, height 4\' 9"  (1.448 m), weight 56.8 kg (125 lb 3 oz), SpO2 97 %.  PHYSICAL EXAMINATION:   Physical Exam  GENERAL:  83 y.o.-year-old patient lying in bed in mild Resp. Distress. EYES: Pupils equal, round, reactive to light and accommodation. No scleral icterus. Extraocular muscles intact.  HEENT: Head atraumatic, normocephalic. Oropharynx and nasopharynx clear.  NECK:  Supple, no jugular venous distention. No thyroid enlargement, no tenderness.  LUNGS: Good a/e b/l, no wheezing, bibasilar rales, No rhonchi. No use of accessory muscles of respiration.   CARDIOVASCULAR: S1, S2 normal. No murmurs, rubs, or gallops.  ABDOMEN: Soft, nontender, nondistended. Bowel sounds present. No organomegaly or mass.  EXTREMITIES: No cyanosis, clubbing, +1 edema b/l   NEUROLOGIC: Cranial nerves II through XII are intact. No focal Motor or sensory deficits b/l.   PSYCHIATRIC: The patient is alert and oriented x 3.  SKIN: No obvious rash, lesion, or ulcer.    LABORATORY PANEL:   CBC  Recent Labs Lab 05/06/17 0453  WBC 3.1*  HGB 8.5*  HCT 24.2*  PLT 170   ------------------------------------------------------------------------------------------------------------------  Chemistries   Recent Labs Lab 05/04/17 1828  05/08/17 0508  NA 125*  < > 126*  K 4.6  < > 4.1  CL 93*  < > 93*  CO2 24  < > 26  GLUCOSE 112*  < > 83  BUN 52*  < > 53*  CREATININE 2.05*  < > 1.99*  CALCIUM 8.6*  < > 8.4*  AST 20  --   --   ALT 14  --   --   ALKPHOS 88  --   --   BILITOT 0.5  --   --   < > = values in this interval not displayed. ------------------------------------------------------------------------------------------------------------------  Cardiac Enzymes  Recent Labs Lab 05/05/17 1201  TROPONINI <0.03   ------------------------------------------------------------------------------------------------------------------  RADIOLOGY:  Ct Chest Wo Contrast  Result Date: 05/07/2017 CLINICAL DATA:  83 y/o F; shortness of breath. Chronic heart failure. EXAM: CT CHEST WITHOUT CONTRAST TECHNIQUE: Multidetector CT imaging of the chest was performed following the standard protocol without IV contrast. COMPARISON:  08/01/2014 chest CT. FINDINGS: Cardiovascular: Normal caliber thoracic aorta with moderate calcific atherosclerosis. Enlarged main pulmonary artery measuring up to 3.8 cm. Severe coronary artery calcification. Mild cardiomegaly. No pericardial effusion. Mediastinum/Nodes: No enlarged mediastinal or axillary lymph nodes. Multiple calcified thyroid  nodules. Lungs/Pleura: Smooth interlobular septal thickening compatible with interstitial pulmonary edema. Moderate right and small left pleural effusions. Right infrahilar and lung base opacity probably represents compressive atelectasis. Pneumonia is not excluded. Upper Abdomen: Subcentimeter hyperdense nodules within the left upper pole of the kidney compatible with hemorrhagic cysts. Chest wall edema. Musculoskeletal: Severe dextrocurvature at the thoracolumbar junction. Multiple chronic bilateral rib fractures. Left 4, 5, 6, and seventh lateral rib fractures with callus formation new from prior chest CT compatible with interval chronic fracture. IMPRESSION: 1. Mild interstitial pulmonary edema. 2. Moderate right and small left pleural effusions. 3. Right infrahilar and lung base opacity probably represents compressive atelectasis. Pneumonia is not excluded. 4. Mild cardiomegaly and severe coronary artery calcification. 5. Enlarged main pulmonary artery may represent pulmonary artery hypertension. Electronically Signed   By: Mitzi Hansen M.D.   On: 05/07/2017 15:57     ASSESSMENT AND PLAN:   83 year old female with past medical history of hypertension, hyperlipidemia, GERD, chronic kidney disease, CHF, chronic atrial fibrillation who presented to the hospital due to shortness of breath.  1. Acute respiratory failure with hypoxia-thought to be related to CHF and being diuresed with IV Lasix but continues to have significant shortness of breath. -CT chest noncontrast yesterday showing evidence of fluid overload along with some pulmonary hypertension. -I will increase diuresis with IV Lasix, and follow clinically. Continue O2 supplementation.  2. CHF-acute on chronic diastolic CHF. -Being diuresed with IV Lasix about 3 L (-) since admission. Cr. Was trending up so Lasix tapered but due to CT chest findings and still having significant fluid overload will advance lasix dose and follow  response.  - follow I's and O's and daily weights. -Continue Cardizem, Coreg, Imdur, lisinopril.  3. CKD Stage III - Cr. Close to baseline but due to aggressive diuresis will follow BUN/cr.  - nephro consult.   4. Hyponatremia - due to CHF/volume overload.  - clinically asymptomatic. ?? Need for Tolvaptan. Will consult nephro and discussed w/ Dr. Thedore Mins.   5. History of chronic atrial fibrillation-currently rate controlled. Continue Cardizem. - not on long term anti-coagulation due to advanced age and high fall risk.   6. Essential hypertension-continue Cardizem lisinopril, hydralazine, Coreg. - BP stable.   7. COPD - no acute exacerbation. Cont. Duonebs PRN.   8. Hyperlipidemia - cont. Simvastatin.   Pt Eval noted.   All the records are reviewed and case discussed with Care Management/Social Worker. Management plans discussed with the patient, family and they are in agreement.  CODE STATUS: Full code  DVT Prophylaxis: Hep. SQ  TOTAL TIME TAKING CARE OF THIS PATIENT: 30 minutes.   POSSIBLE D/C IN 1-2 DAYS, DEPENDING ON CLINICAL CONDITION.   Houston Siren M.D on 05/08/2017 at 3:03 PM  Between 7am to 6pm - Pager - [phone]  After 6pm go to [email or web address] - Social research officer, government  [facility] [place] [facility]  Office  [phone]  CC: Primary care physician; Marisue Ivan, MD

## 2017-05-08 NOTE — Progress Notes (Signed)
[facility], [place] 05/08/17  Subjective:  Patient known to our practice from previous admissions.  Consult requested for evaluation of hyponatremia and fluid overload Patient's daughter reports that she has gained 12 pounds since her discharge in may.  Patient feels more short of breath over the past many days.  She was treated with IV Lasix.  More recently, her dose of Lasix was increased to 40 mg 3 times per day.  Patient was on room air when seen.  Feels some dyspnea on exertion. Sodium level was 130 at the time of admission and has dropped 126 today. Serum creatinine is in her usual range   Objective:  Vital signs in last 24 hours:  Temp:  [97.4 F (36.3 C)-98.9 F (37.2 C)] 98.9 F (37.2 C) (06/18 0443) Pulse Rate:  [65-117] 98 (06/18 1138) Resp:  [16-22] 22 (06/18 0725) BP: (126-145)/(63-89) 145/89 (06/18 1138) SpO2:  [96 %-99 %] 97 % (06/18 1138) Weight:  [56.8 kg (125 lb 3 oz)] 56.8 kg (125 lb 3 oz) (06/18 0500)  Weight change: -0.913 kg (-2 lb 0.2 oz) Filed Weights   05/06/17 0932 05/07/17 0502 05/08/17 0500  Weight: 57.7 kg (127 lb 3.2 oz) 57.6 kg (127 lb) 56.8 kg (125 lb 3 oz)    Intake/Output:    Intake/Output Summary (Last 24 hours) at 05/08/17 1644 Last data filed at 05/08/17 1402  Gross per 24 hour  Intake              720 ml  Output             1400 ml  Net             -680 ml     Physical Exam: General: No acute distress, laying in the bed  HEENT Anicteric, moist oral mucous membranes  Neck supple  Pulm/lungs Mild diffuse basilar crackles  CVS/Heart irregular, 2/6 systolic murmur  Abdomen:  Soft, tympanic, mildly distended  Extremities: Trace to 1+ dependent edema  Neurologic: Alert, able to answer questions, decreased hearing  Skin: Normal turgor, no acute rashes          Basic Metabolic Panel:   Recent Labs Lab 05/04/17 1828 05/05/17 0547 05/06/17 0453 05/07/17 0448 05/08/17 0508  NA 125* 130* 130* 131*  126*  K 4.6 3.5 3.9 3.7 4.1  CL 93* 96* 95* 97* 93*  CO2 24 27 26 27 26   GLUCOSE 112* 87 88 87 83  BUN 52* 47* 53* 51* 53*  CREATININE 2.05* 1.94* 1.92* 2.07* 1.99*  CALCIUM 8.6* 8.7* 8.8* 8.5* 8.4*     CBC:  Recent Labs Lab 05/04/17 1828 05/05/17 0547 05/06/17 0453  WBC 3.3* 3.3* 3.1*  NEUTROABS 2.3  --   --   HGB 8.6* 8.3* 8.5*  HCT 25.7* 24.3* 24.2*  MCV 90.5 88.8 88.3  PLT 182 168 170     No results found for: HEPBSAG, HEPBSAB, HEPBIGM    Microbiology:  No results found for this or any previous visit (from the past 240 hour(s)).  Coagulation Studies: No results for input(s): LABPROT, INR in the last 72 hours.  Urinalysis: No results for input(s): COLORURINE, LABSPEC, PHURINE, GLUCOSEU, HGBUR, BILIRUBINUR, KETONESUR, PROTEINUR, UROBILINOGEN, NITRITE, LEUKOCYTESUR in the last 72 hours.  Invalid input(s): APPERANCEUR    Imaging: Ct Chest Wo Contrast  Result Date: 05/07/2017 CLINICAL DATA:  83 y/o F; shortness of breath. Chronic heart failure. EXAM: CT CHEST WITHOUT CONTRAST TECHNIQUE: Multidetector CT imaging of the chest was performed  following the standard protocol without IV contrast. COMPARISON:  08/01/2014 chest CT. FINDINGS: Cardiovascular: Normal caliber thoracic aorta with moderate calcific atherosclerosis. Enlarged main pulmonary artery measuring up to 3.8 cm. Severe coronary artery calcification. Mild cardiomegaly. No pericardial effusion. Mediastinum/Nodes: No enlarged mediastinal or axillary lymph nodes. Multiple calcified thyroid nodules. Lungs/Pleura: Smooth interlobular septal thickening compatible with interstitial pulmonary edema. Moderate right and small left pleural effusions. Right infrahilar and lung base opacity probably represents compressive atelectasis. Pneumonia is not excluded. Upper Abdomen: Subcentimeter hyperdense nodules within the left upper pole of the kidney compatible with hemorrhagic cysts. Chest wall edema. Musculoskeletal: Severe  dextrocurvature at the thoracolumbar junction. Multiple chronic bilateral rib fractures. Left 4, 5, 6, and seventh lateral rib fractures with callus formation new from prior chest CT compatible with interval chronic fracture. IMPRESSION: 1. Mild interstitial pulmonary edema. 2. Moderate right and small left pleural effusions. 3. Right infrahilar and lung base opacity probably represents compressive atelectasis. Pneumonia is not excluded. 4. Mild cardiomegaly and severe coronary artery calcification. 5. Enlarged main pulmonary artery may represent pulmonary artery hypertension. Electronically Signed   By: Mitzi HansenLance  Furusawa-Stratton M.D.   On: 05/07/2017 15:57     Medications:   . sodium chloride     . acetaminophen  1,000 mg Oral Daily  . aspirin EC  81 mg Oral Daily  . carvedilol  25 mg Oral BID WC  . diltiazem  240 mg Oral Daily  . docusate sodium  100 mg Oral BID  . ferrous gluconate  324 mg Oral BID  . fluticasone  2 spray Each Nare Daily  . furosemide  40 mg Intravenous Q8H  . heparin  5,000 Units Subcutaneous Q8H  . hydrALAZINE  100 mg Oral BID  . isosorbide mononitrate  120 mg Oral Daily  . lisinopril  10 mg Oral Daily  . mometasone-formoterol  2 puff Inhalation BID  . pantoprazole  40 mg Oral Daily  . simvastatin  40 mg Oral q1800  . sodium chloride flush  3 mL Intravenous Q12H   sodium chloride, ipratropium-albuterol, ondansetron **OR** ondansetron (ZOFRAN) IV, sodium chloride flush, traMADol  Assessment/ Plan:  83 y.o. female with hypertension, GERD, chronic kidney disease stage IV, congestive heart failure, chronic atrial fibrillation presents for worsening shortness of breath CT scan suggests some findings of fluid overload and probably hypertension Most recent echo from 5/20 shows normal LV EF of 60-65%.  Moderate mitral regurgitation, mild LVH, normal right ventricular systolic function  1.  Hyponatremia 2.  Shortness of breath 3.  Chronic kidney disease stage IV.   Baseline creatinine 1.99/GFR 21  The cause for hyponatremia is not entirely clear but likely combination of SIADH and congestive heart failure.    Plan: Obtain urine studies One dose of Tolvaptan Continue iv lasix Oxygen supplementation    LOS: 4 Laura [name] 6/18/20184:44 PM  [facility] [place], [place] [phone]

## 2017-05-09 DIAGNOSIS — R0602 Shortness of breath: Secondary | ICD-10-CM

## 2017-05-09 DIAGNOSIS — Z515 Encounter for palliative care: Secondary | ICD-10-CM

## 2017-05-09 DIAGNOSIS — Z66 Do not resuscitate: Secondary | ICD-10-CM

## 2017-05-09 LAB — BASIC METABOLIC PANEL
ANION GAP: 7 (ref 5–15)
BUN: 57 mg/dL — AB (ref 6–20)
CHLORIDE: 96 mmol/L — AB (ref 101–111)
CO2: 28 mmol/L (ref 22–32)
Calcium: 8.7 mg/dL — ABNORMAL LOW (ref 8.9–10.3)
Creatinine, Ser: 2.31 mg/dL — ABNORMAL HIGH (ref 0.44–1.00)
GFR, EST AFRICAN AMERICAN: 20 mL/min — AB (ref >60)
GFR, EST NON AFRICAN AMERICAN: 17 mL/min — AB (ref >60)
Glucose, Bld: 87 mg/dL (ref 65–99)
POTASSIUM: 3.8 mmol/L (ref 3.5–5.1)
SODIUM: 131 mmol/L — AB (ref 135–145)

## 2017-05-09 LAB — TSH: TSH: 1.562 u[IU]/mL (ref 0.350–4.500)

## 2017-05-09 LAB — CORTISOL: Cortisol, Plasma: 15.1 ug/dL

## 2017-05-09 MED ORDER — FUROSEMIDE 10 MG/ML IJ SOLN: 40.0000 mg | Freq: Two times a day (BID) | INTRAMUSCULAR | Status: DC

## 2017-05-09 MED ORDER — FUROSEMIDE 40 MG PO TABS
40.0000 mg | ORAL_TABLET | Freq: Two times a day (BID) | ORAL | Status: DC
  Administered 2017-05-09 – 2017-05-10 (×2): 40 mg via ORAL
  Filled 2017-05-09 (×2): qty 1

## 2017-05-09 MED ORDER — TOLVAPTAN 15 MG PO TABS
15.0000 mg | ORAL_TABLET | Freq: Once | ORAL | Status: AC
  Administered 2017-05-09: 15 mg via ORAL
  Filled 2017-05-09: qty 1

## 2017-05-09 NOTE — Progress Notes (Signed)
[facility] [place] at [facility]   PATIENT NAME: Laura [name]    MR#:  [id number]  DATE OF BIRTH:  September 10, 1924  SUBJECTIVE:   Patient here due to shortness of breath and noted to be in acute on chronic CHF.  Shortness of breath at rest improved but still has abdominal bloating and shortness of breath on exertion. Son at bedside.    REVIEW OF SYSTEMS:    Review of Systems  Constitutional: Negative for chills and fever.  HENT: Negative for congestion and tinnitus.   Eyes: Negative for blurred vision and double vision.  Respiratory: Positive for shortness of breath. Negative for cough and wheezing.   Cardiovascular: Negative for chest pain, orthopnea and PND.  Gastrointestinal: Negative for abdominal pain, diarrhea, nausea and vomiting.  Genitourinary: Negative for dysuria and hematuria.  Neurological: Negative for dizziness, sensory change and focal weakness.  All other systems reviewed and are negative.   Nutrition: heart healthy Tolerating Diet: Yes Tolerating PT: eval noted  DRUG ALLERGIES:   Allergies  Allergen Reactions  . Penicillins Shortness Of Breath        . Codeine Itching  . Amlodipine Rash    Peripheral edema  . Ciprofloxacin Rash  . Sulfa Antibiotics Rash    VITALS:  Blood pressure 104/65, pulse 70, temperature 98.2 F (36.8 C), temperature source Oral, resp. rate 18, height 4\' 9"  (1.448 m), weight 57 kg (125 lb 11.2 oz), SpO2 96 %.  PHYSICAL EXAMINATION:   Physical Exam  GENERAL:  83 y.o.-year-old patient lying in bed in mild Resp. Distress. EYES: Pupils equal, round, reactive to light and accommodation. No scleral icterus. Extraocular muscles intact.  HEENT: Head atraumatic, normocephalic. Oropharynx and nasopharynx clear.  NECK:  Supple, no jugular venous distention. No thyroid enlargement, no tenderness.  LUNGS: Good a/e b/l, no wheezing, bibasilar rales, No rhonchi. No use of accessory muscles of respiration.   CARDIOVASCULAR: S1, S2 normal. No murmurs, rubs, or gallops.  ABDOMEN: Soft, nontender, nondistended. Bowel sounds present. No organomegaly or mass.  EXTREMITIES: No cyanosis, clubbing, +1 edema b/l   NEUROLOGIC: Cranial nerves II through XII are intact. No focal Motor or sensory deficits b/l.   PSYCHIATRIC: The patient is alert and oriented x 3.  SKIN: No obvious rash, lesion, or ulcer.    LABORATORY PANEL:   CBC  Recent Labs Lab 05/06/17 0453  WBC 3.1*  HGB 8.5*  HCT 24.2*  PLT 170   ------------------------------------------------------------------------------------------------------------------  Chemistries   Recent Labs Lab 05/04/17 1828  05/09/17 0614  NA 125*  < > 131*  K 4.6  < > 3.8  CL 93*  < > 96*  CO2 24  < > 28  GLUCOSE 112*  < > 87  BUN 52*  < > 57*  CREATININE 2.05*  < > 2.31*  CALCIUM 8.6*  < > 8.7*  AST 20  --   --   ALT 14  --   --   ALKPHOS 88  --   --   BILITOT 0.5  --   --   < > = values in this interval not displayed. ------------------------------------------------------------------------------------------------------------------  Cardiac Enzymes  Recent Labs Lab 05/05/17 1201  TROPONINI <0.03   ------------------------------------------------------------------------------------------------------------------  RADIOLOGY:  No results found.   ASSESSMENT AND PLAN:   83 year old female with past medical history of hypertension, hyperlipidemia, GERD, chronic kidney disease, CHF, chronic atrial fibrillation who presented to the hospital due to shortness of breath.  1. Acute respiratory failure with hypoxia- due  to CHF and being diuresed with IV Lasix and improving -CT chest noncontrast showing evidence of fluid overload along with some pulmonary hypertension. - cont IV lasix and follow I's and O's and Daily weights, and follow clinically. Continue O2 supplementation.  2. CHF-acute on chronic diastolic CHF. -Being diuresed with IV  Lasix about 4 L (-) since admission. - follow I's and O's and daily weights. -Continue Cardizem, Coreg, Imdur, lisinopril.  3. CKD Stage III - Cr. Close to baseline but will follow due to aggressive diuresis - appreciate nephro consult.   4. Hyponatremia - due to CHF/SIADH - given Tolvaptan yesterday and improved.  Given another dose today.  Appreciate Nephro consult and will follow sodium.  - clinically asymptomatic.   5. History of chronic atrial fibrillation-currently rate controlled. Continue Cardizem. - not on long term anti-coagulation due to advanced age and high fall risk.   6. Essential hypertension-continue Cardizem lisinopril, hydralazine, Coreg. - BP stable.   7. COPD - no acute exacerbation. Cont. Duonebs PRN.   8. Hyperlipidemia - cont. Simvastatin.   A palliative care consult obtained for goals of care. Plan for discharge home with Hospice tomorrow.   All the records are reviewed and case discussed with Care Management/Social Worker. Management plans discussed with the patient, family and they are in agreement.  CODE STATUS: Full code  DVT Prophylaxis: Hep. SQ  TOTAL TIME TAKING CARE OF THIS PATIENT: 30 minutes.   POSSIBLE D/C IN 1-2 DAYS, DEPENDING ON CLINICAL CONDITION.   Houston Siren M.D on 05/09/2017 at 2:47 PM  Between 7am to 6pm - Pager - [phone]  After 6pm go to [email or web address] - Social research officer, government  [facility] [place] [facility]  Office  [phone]  CC: Primary care physician; Marisue Ivan, MD

## 2017-05-09 NOTE — Progress Notes (Signed)
  This NP visited patient at the bedside/discussed with daughter as a follow up to  yesterday's GOCs meeting.  Plan is continue current medical interventions to treat chronic disease, when medically stable discharge home with hospice.  Prognosis is likely less than 6 months.  CHF, continued hyponatremia, CKD stage IV, HTN, focus is comfort at home.    MOST form completed to reflect comfort approach and avoid rehospitalization.  Discussed with patient the importance of continued conversation with family and their  medical providers regarding overall plan of care and treatment options,  ensuring decisions are within the context of the patients values and GOCs.  Questions and concerns addressed    Discussed with Dr Sainani  Likely discharge home in the morning.  Time in           Cherlynn Kaiserime out            Total time spent on the unit was 35 min  Greater than 50% of the time was spent in counseling and coordination of care  Lorinda CreedMary Kayne Yuhas NP  Palliative Medicine Team Team Phone # [phone] Pager [phone]  Patient ID: Laura [name], female   DOB: 07-27-1924, 83 y.o.   MRN: [id number]

## 2017-05-09 NOTE — Progress Notes (Signed)
   05/09/17 1110  Clinical Encounter Type  Visited With Patient;Health care provider  Visit Type Initial  Referral From Other (Comment) (morning rounds)   Patient was with nurse. Introduced myself as OrthoptistChaplain and that I would be back later as she was with her nurse.

## 2017-05-09 NOTE — Care Management (Signed)
Case discussed with attending. LM for palliative care team for return call regarding goals of care. IF RNCM has not heard from palliative care this afternoon, will call daughter regarding goals

## 2017-05-09 NOTE — Progress Notes (Signed)
Physical Therapy Treatment Patient Details Name: Laura [name] N Medal MRN: [id number] DOB: 1924-03-18 Today's Date: 05/09/2017    History of Present Illness Pt is a 83 y.o.femalewith a known history of Atrial fibrillation, CHF, CK D stage IV, hyperlipidemia, hypertension, who presents to the hospital with complaints of shortness of breath, worsening over the past one week, especially bad over the past 2 days, patient admits of wheezing, some dry cough. She also notices few episodes of chest pain in the left side of the chest fleeting, she is not sure if they're related to breathing or exercise. Patient was struggling in emergency room to breathe, had pursed lip breathing, hospitalist services were contacted for admission. Patient's daughter admitted of 12 pound weight gain over the past 3 weeks, 5 pounds In over the past 2-3 days.  EKG in emergency room revealed atrial fibrillation, rate of 94 bpm, left axis deviation, poor R-wave progression, likely anterior infarct, no acute STTchanges.  Assessment includes: Acute on chronic diastolic CHF, CKD stage IV, leukopenia, and LLE swelling with dopplar negative for DVT.     PT Comments    Fatigued on first attempt this am but agreed on second.  Reports general fatigue from poor sleep due to Lasix last night and frequent bathroom trips.   Bed mobility with rails but without assist.  She was able to ambulate 50' x 2 with walker and min guard/assist.  While she did not have a LOB gait is unsteady at times with increased fall risk.  Pt stated she is able to balance better in her shoes when walking but does not wear them at all times at home. Discussed safety upon discharge and encouraged her to have +1 assist at all times for mobility.  Has rolling walker and rollator at home. Does not have bedside commode for at night for toileting.  She stated she does not want one for discharge as her bathroom is close to her bed and she does not feel as it will be useful.    Follow Up Recommendations  Home health PT;Supervision for mobility/OOB     Equipment Recommendations  None recommended by PT    Recommendations for Other Services       Precautions / Restrictions Precautions Precautions: Fall Restrictions Weight Bearing Restrictions: No    Mobility  Bed Mobility Overal bed mobility: Modified Independent             General bed mobility comments: Extra time and effort required during bed mobility tasks.  Transfers   Equipment used: Rolling walker (2 wheeled) Transfers: Sit to/from Stand Sit to Stand: Min guard            Ambulation/Gait Ambulation/Gait assistance: Min guard;Min assist Ambulation Distance (Feet): 50 Feet Assistive device: Rolling walker (2 wheeled)       General Gait Details: Trendelenburg on LLE with min instability but no LOB.  50' x 2   Stairs            Wheelchair Mobility    Modified Rankin (Stroke Patients Only)       Balance Overall balance assessment: Needs assistance Sitting-balance support: No upper extremity supported;Feet supported Sitting balance-Leahy Scale: Good     Standing balance support: Bilateral upper extremity supported Standing balance-Leahy Scale: Good                              Cognition Arousal/Alertness: Awake/alert Behavior During Therapy: WFL for tasks assessed/performed Overall Cognitive Status:  Within Functional Limits for tasks assessed                                        Exercises Other Exercises Other Exercises: Sitting EOB x 10 minutes while discussed discharge and home assistance needs.  Seated LAQ and marches 2 x 10    General Comments        Pertinent Vitals/Pain Pain Assessment: No/denies pain    Home Living                      Prior Function            PT Goals (current goals can now be found in the care plan section) Progress towards PT goals: Progressing toward goals     Frequency    Min 2X/week      PT Plan Current plan remains appropriate    Co-evaluation              AM-PAC PT "6 Clicks" Daily Activity  Outcome Measure  Difficulty turning over in bed (including adjusting bedclothes, sheets and blankets)?: None Difficulty moving from lying on back to sitting on the side of the bed? : A Little Difficulty sitting down on and standing up from a chair with arms (e.g., wheelchair, bedside commode, etc,.)?: Total Help needed moving to and from a bed to chair (including a wheelchair)?: A Little Help needed walking in hospital room?: A Little Help needed climbing 3-5 steps with a railing? : A Lot 6 Click Score: 16    End of Session Equipment Utilized During Treatment: Gait belt Activity Tolerance: Patient limited by fatigue Patient left: in bed;with bed alarm set;with call bell/phone within reach         Time: 1046-1110 PT Time Calculation (min) (ACUTE ONLY): 24 min  Charges:  $Gait Training: 8-22 mins $Therapeutic Exercise: 8-22 mins                    G Codes:       Danielle Dess, PTA 05/09/17, 11:17 AM

## 2017-05-09 NOTE — Progress Notes (Signed)
[facility], [place] 05/09/17  Subjective:  Patient known to our practice from previous admissions.  Consult requested for evaluation of hyponatremia and fluid overload This morning patient reports that her breathing is better Na improved to 131 Reports frequent voiding.   Objective:  Vital signs in last 24 hours:  Temp:  [98.6 F (37 C)-98.9 F (37.2 C)] 98.6 F (37 C) (06/19 0838) Pulse Rate:  [87-139] 102 (06/19 0838) Resp:  [17-19] 18 (06/19 0838) BP: (121-145)/(60-89) 121/79 (06/19 0838) SpO2:  [94 %-99 %] 94 % (06/19 0838) Weight:  [57 kg (125 lb 11.2 oz)] 57 kg (125 lb 11.2 oz) (06/19 0303)  Weight change: 0.232 kg (8.2 oz) Filed Weights   05/07/17 0502 05/08/17 0500 05/09/17 0303  Weight: 57.6 kg (127 lb) 56.8 kg (125 lb 3 oz) 57 kg (125 lb 11.2 oz)    Intake/Output:    Intake/Output Summary (Last 24 hours) at 05/09/17 1129 Last data filed at 05/09/17 0934  Gross per 24 hour  Intake              840 ml  Output             2350 ml  Net            -1510 ml     Physical Exam: General: No acute distress, laying in the bed  HEENT Anicteric, moist oral mucous membranes  Neck supple  Pulm/lungs Mild diffuse basilar crackles, room air  CVS/Heart irregular, 2/6 systolic murmur  Abdomen:  Soft, tympanic, mildly distended  Extremities: Trace to 1+ dependent edema  Neurologic: Alert, able to answer questions, decreased hearing  Skin: Normal turgor, no acute rashes          Basic Metabolic Panel:   Recent Labs Lab 05/05/17 0547 05/06/17 0453 05/07/17 0448 05/08/17 0508 05/09/17 0614  NA 130* 130* 131* 126* 131*  K 3.5 3.9 3.7 4.1 3.8  CL 96* 95* 97* 93* 96*  CO2 27 26 27 26 28   GLUCOSE 87 88 87 83 87  BUN 47* 53* 51* 53* 57*  CREATININE 1.94* 1.92* 2.07* 1.99* 2.31*  CALCIUM 8.7* 8.8* 8.5* 8.4* 8.7*     CBC:  Recent Labs Lab 05/04/17 1828 05/05/17 0547 05/06/17 0453  WBC 3.3* 3.3* 3.1*  NEUTROABS 2.3  --   --    HGB 8.6* 8.3* 8.5*  HCT 25.7* 24.3* 24.2*  MCV 90.5 88.8 88.3  PLT 182 168 170     No results found for: HEPBSAG, HEPBSAB, HEPBIGM    Microbiology:  No results found for this or any previous visit (from the past 240 hour(s)).  Coagulation Studies: No results for input(s): LABPROT, INR in the last 72 hours.  Urinalysis: No results for input(s): COLORURINE, LABSPEC, PHURINE, GLUCOSEU, HGBUR, BILIRUBINUR, KETONESUR, PROTEINUR, UROBILINOGEN, NITRITE, LEUKOCYTESUR in the last 72 hours.  Invalid input(s): APPERANCEUR    Imaging: Ct Chest Wo Contrast  Result Date: 05/07/2017 CLINICAL DATA:  83 y/o F; shortness of breath. Chronic heart failure. EXAM: CT CHEST WITHOUT CONTRAST TECHNIQUE: Multidetector CT imaging of the chest was performed following the standard protocol without IV contrast. COMPARISON:  08/01/2014 chest CT. FINDINGS: Cardiovascular: Normal caliber thoracic aorta with moderate calcific atherosclerosis. Enlarged main pulmonary artery measuring up to 3.8 cm. Severe coronary artery calcification. Mild cardiomegaly. No pericardial effusion. Mediastinum/Nodes: No enlarged mediastinal or axillary lymph nodes. Multiple calcified thyroid nodules. Lungs/Pleura: Smooth interlobular septal thickening compatible with interstitial pulmonary edema. Moderate right and small left pleural effusions.  Right infrahilar and lung base opacity probably represents compressive atelectasis. Pneumonia is not excluded. Upper Abdomen: Subcentimeter hyperdense nodules within the left upper pole of the kidney compatible with hemorrhagic cysts. Chest wall edema. Musculoskeletal: Severe dextrocurvature at the thoracolumbar junction. Multiple chronic bilateral rib fractures. Left 4, 5, 6, and seventh lateral rib fractures with callus formation new from prior chest CT compatible with interval chronic fracture. IMPRESSION: 1. Mild interstitial pulmonary edema. 2. Moderate right and small left pleural effusions. 3.  Right infrahilar and lung base opacity probably represents compressive atelectasis. Pneumonia is not excluded. 4. Mild cardiomegaly and severe coronary artery calcification. 5. Enlarged main pulmonary artery may represent pulmonary artery hypertension. Electronically Signed   By: Mitzi HansenLance  Furusawa-Stratton M.D.   On: 05/07/2017 15:57     Medications:   . sodium chloride     . acetaminophen  1,000 mg Oral Daily  . aspirin EC  81 mg Oral Daily  . carvedilol  25 mg Oral BID WC  . diltiazem  240 mg Oral Daily  . docusate sodium  100 mg Oral BID  . ferrous gluconate  324 mg Oral BID  . fluticasone  2 spray Each Nare Daily  . furosemide  40 mg Intravenous BID  . heparin  5,000 Units Subcutaneous Q8H  . hydrALAZINE  100 mg Oral BID  . isosorbide mononitrate  120 mg Oral Daily  . lisinopril  10 mg Oral Daily  . mometasone-formoterol  2 puff Inhalation BID  . pantoprazole  40 mg Oral Daily  . simvastatin  40 mg Oral q1800  . sodium chloride flush  3 mL Intravenous Q12H   sodium chloride, ipratropium-albuterol, ondansetron **OR** ondansetron (ZOFRAN) IV, sodium chloride flush, traMADol  Assessment/ Plan:  83 y.o. female with hypertension, GERD, chronic kidney disease stage IV, congestive heart failure, chronic atrial fibrillation presents for worsening shortness of breath CT scan suggests some findings of fluid overload and probably hypertension Most recent echo from 5/20 shows normal LV EF of 60-65%.  Moderate mitral regurgitation, mild LVH, normal right ventricular systolic function  1.  Hyponatremia 2.  Shortness of breath 3.  Chronic kidney disease stage IV.  Baseline creatinine 1.99/GFR 21  The cause for hyponatremia is not entirely clear but likely combination of SIADH and congestive heart failure.   Slight increase in Cr is noted. Will monitor  Plan: Obtain urine studies Repeat Tolvaptan today x 1 Change lasix to oral Oxygen supplementation PRN    LOS:  5 [facility] CenterINGH,Trashawn Oquendo 6/19/201811:29 AM  [facility] [place], [place] [phone]

## 2017-05-09 NOTE — Progress Notes (Signed)
[facility]  SUBJECTIVE: Patient reports feeling somewhat better this morning. She complains of exertional shortness of breath. She denies chest pain, palpitations, heart racing, or lightheadedness.   Vitals:   05/08/17 1949 05/09/17 0303 05/09/17 0347 05/09/17 0838  BP: 127/60 121/84  121/79  Pulse: 87 (!) 139 95 (!) 102  Resp: 17 19  18   Temp: 98.9 F (37.2 C) 98.7 F (37.1 C)  98.6 F (37 C)  TempSrc: Oral Oral  Oral  SpO2: 99% 99%  94%  Weight:  57 kg (125 lb 11.2 oz)    Height:         Intake/Output Summary (Last 24 hours) at 05/09/17 1043 Last data filed at 05/09/17 0934  Gross per 24 hour  Intake              840 ml  Output             2350 ml  Net            -1510 ml      PHYSICAL EXAM  General: Well developed, well nourished, in no acute distress HEENT:  Normocephalic and atramatic Neck:  No JVD.  Lungs: Slight increased effort of breathing, no retractions, no accessory muscle use. Diffuse bibasilar crackles Heart: Irregularly irregular. 1-2/6 systolic murmur Abdomen: Bowel sounds are positive, abdomen soft and non-tender  Msk:  Back normal, sitting on side of bed without difficulty Extremities: No clubbing, cyanosis or edema.   Neuro: Alert and oriented X 3. Psych:  Good affect, responds appropriately   LABS: Basic Metabolic Panel:  Recent Labs  69/62/9506/18/18 0508 05/09/17 0614  NA 126* 131*  K 4.1 3.8  CL 93* 96*  CO2 26 28  GLUCOSE 83 87  BUN 53* 57*  CREATININE 1.99* 2.31*  CALCIUM 8.4* 8.7*   Liver Function Tests: No results for input(s): AST, ALT, ALKPHOS, BILITOT, PROT, ALBUMIN in the last 72 hours. No results for input(s): LIPASE, AMYLASE in the last 72 hours. CBC: No results for input(s): WBC, NEUTROABS, HGB, HCT, MCV, PLT in the last 72 hours. Cardiac Enzymes: No results for input(s): CKTOTAL, CKMB, CKMBINDEX, TROPONINI in the last 72 hours. BNP: Invalid input(s): POCBNP D-Dimer: No results for input(s): DDIMER in the last 72  hours. Hemoglobin A1C: No results for input(s): HGBA1C in the last 72 hours. Fasting Lipid Panel: No results for input(s): CHOL, HDL, LDLCALC, TRIG, CHOLHDL, LDLDIRECT in the last 72 hours. Thyroid Function Tests:  Recent Labs  05/09/17 0614  TSH 1.562   Anemia Panel: No results for input(s): VITAMINB12, FOLATE, FERRITIN, TIBC, IRON, RETICCTPCT in the last 72 hours.  Ct Chest Wo Contrast  Result Date: 05/07/2017 CLINICAL DATA:  83 y/o F; shortness of breath. Chronic heart failure. EXAM: CT CHEST WITHOUT CONTRAST TECHNIQUE: Multidetector CT imaging of the chest was performed following the standard protocol without IV contrast. COMPARISON:  08/01/2014 chest CT. FINDINGS: Cardiovascular: Normal caliber thoracic aorta with moderate calcific atherosclerosis. Enlarged main pulmonary artery measuring up to 3.8 cm. Severe coronary artery calcification. Mild cardiomegaly. No pericardial effusion. Mediastinum/Nodes: No enlarged mediastinal or axillary lymph nodes. Multiple calcified thyroid nodules. Lungs/Pleura: Smooth interlobular septal thickening compatible with interstitial pulmonary edema. Moderate right and small left pleural effusions. Right infrahilar and lung base opacity probably represents compressive atelectasis. Pneumonia is not excluded. Upper Abdomen: Subcentimeter hyperdense nodules within the left upper pole of the kidney compatible with hemorrhagic cysts. Chest wall edema. Musculoskeletal: Severe dextrocurvature at the thoracolumbar junction. Multiple chronic bilateral rib fractures. Left  4, 5, 6, and seventh lateral rib fractures with callus formation new from prior chest CT compatible with interval chronic fracture. IMPRESSION: 1. Mild interstitial pulmonary edema. 2. Moderate right and small left pleural effusions. 3. Right infrahilar and lung base opacity probably represents compressive atelectasis. Pneumonia is not excluded. 4. Mild cardiomegaly and severe coronary artery  calcification. 5. Enlarged main pulmonary artery may represent pulmonary artery hypertension. Electronically Signed   By: Mitzi Hansen M.D.   On: 05/07/2017 15:57     Echo: LV EF of 60-65%, moderate mitral regurgitation  TELEMETRY: Atrial fibrillation, 96 bpm  ASSESSMENT AND PLAN:  Active Problems:   Acute on chronic diastolic CHF (congestive heart failure) (HCC)   CKD (chronic kidney disease), stage IV (HCC)   Leukopenia   Swelling of lower extremity   Palliative care by specialist   DNR (do not resuscitate)   Shortness of breath    1. Acute on chronic diastolic heart failure 2. Chronic kidney disease, stage IV, creatinine 2.31 3. Hyponatremia, improved from yesterday  Recommendation: 1. Continue current therapy. 2. Continue IV Lasix with careful monitoring of renal status 3. No further cardiac diagnostics at this time  Leanora Ivanoff, Cordelia Poche 05/09/2017 10:43 AM

## 2017-05-09 NOTE — Progress Notes (Signed)
Dr Cherlynn Kaisersainani was informed about pt having 10 beats of v-tach , no new order continue to monitor

## 2017-05-10 LAB — PROTEIN ELECTRO, RANDOM URINE
ALPHA-1-GLOBULIN, U: 7.8 %
ALPHA-2-GLOBULIN, U: 5.1 %
Albumin ELP, Urine: 72.3 %
Beta Globulin, U: 10.3 %
GAMMA GLOBULIN, U: 4.4 %
Total Protein, Urine: 63.6 mg/dL

## 2017-05-10 LAB — PROTEIN ELECTROPHORESIS, SERUM
A/G RATIO SPE: 1.4 (ref 0.7–1.7)
ALBUMIN ELP: 3.1 g/dL (ref 2.9–4.4)
ALPHA-2-GLOBULIN: 0.8 g/dL (ref 0.4–1.0)
Alpha-1-Globulin: 0.3 g/dL (ref 0.0–0.4)
BETA GLOBULIN: 0.7 g/dL (ref 0.7–1.3)
GAMMA GLOBULIN: 0.5 g/dL (ref 0.4–1.8)
Globulin, Total: 2.2 g/dL (ref 2.2–3.9)
Total Protein ELP: 5.3 g/dL — ABNORMAL LOW (ref 6.0–8.5)

## 2017-05-10 LAB — BASIC METABOLIC PANEL
Anion gap: 7 (ref 5–15)
BUN: 58 mg/dL — ABNORMAL HIGH (ref 6–20)
CO2: 29 mmol/L (ref 22–32)
Calcium: 8.9 mg/dL (ref 8.9–10.3)
Chloride: 99 mmol/L — ABNORMAL LOW (ref 101–111)
Creatinine, Ser: 2.27 mg/dL — ABNORMAL HIGH (ref 0.44–1.00)
GFR calc Af Amer: 21 mL/min — ABNORMAL LOW (ref >60)
GFR, EST NON AFRICAN AMERICAN: 18 mL/min — AB (ref >60)
Glucose, Bld: 83 mg/dL (ref 65–99)
Potassium: 3.8 mmol/L (ref 3.5–5.1)
Sodium: 135 mmol/L (ref 135–145)

## 2017-05-10 MED ORDER — TORSEMIDE 20 MG PO TABS
40.0000 mg | ORAL_TABLET | Freq: Every day | ORAL | 2 refills | Status: AC
Start: 2017-05-10 — End: ?

## 2017-05-10 NOTE — Discharge Summary (Signed)
[employer] - [place] at [facility]   PATIENT NAME: Laura [name]    MR#:  [id number]  DATE OF BIRTH:  05/29/1924  DATE OF ADMISSION:  05/04/2017 ADMITTING PHYSICIAN: Katharina Caper, MD  DATE OF DISCHARGE: 05/10/2017 12:30 PM  PRIMARY CARE PHYSICIAN: Marisue Ivan, MD    ADMISSION DIAGNOSIS:  Swelling [R60.9] Acute on chronic congestive heart failure, unspecified heart failure type (HCC) [I50.9]  DISCHARGE DIAGNOSIS:  Active Problems:   Acute on chronic diastolic CHF (congestive heart failure) (HCC)   CKD (chronic kidney disease), stage IV (HCC)   Leukopenia   Swelling of lower extremity   Palliative care by specialist   DNR (do not resuscitate)   Shortness of breath   SECONDARY DIAGNOSIS:   Past Medical History:  Diagnosis Date  . Anemia   . Arrhythmia    atrial fibrillation  . Atrial fibrillation (HCC)   . CHF (congestive heart failure) (HCC)   . Chronic kidney disease   . GERD (gastroesophageal reflux disease)   . GI bleed   . High cholesterol   . Hypertension   . Hyponatremia   . Macular degeneration   . SVT (supraventricular tachycardia) [facility])     HOSPITAL COURSE:   83 year old female with past medical history of hypertension, hyperlipidemia, GERD, chronic kidney disease, CHF, chronic atrial fibrillation who presented to the hospital due to shortness of breath.  1. Acute respiratory failure with hypoxia- This was secondary to CHF. -Patient was aggressively diuresed with IV Lasix and has clinically improved.  She is about 6 L negative since admission. Her lower extremity edema and abdominal bloating has also improved. -Patient did have a CT scan of the chest done during hospitalization which showed volume overload without any evidence of chronic lung disease or interstitial lung disease. She will continue oxygen at home and follow-up with her primary care physician as an outpatient.  -she was discharged home with hospice services.  2.  CHF-acute on chronic diastolic CHF. - she was diuresed w/ IV lasix and has improved and she is about 6 L (-) since admission. - she will Continue Cardizem, Coreg, Imdur, lisinopril. - she will resume her Torsemide upon discharge and her dose has been adjusted as per Nephrology.  3. CKD Stage III - Cr. Close to baseline and remained stable despite getting aggressive diuresis.  -she will follow-up with nephrology as an outpatient  4. Hyponatremia - due to CHF/SIADH - pt. given 2 doses of Tolvaptan while in hospital and sodium level has improved and normalized now.  5. History of chronic atrial fibrillation-currently rate controlled. She will Continue Cardizem, Coreg. - she is not on long term anti-coagulation due to advanced age and high fall risk.   6. Essential hypertension- she will continue Cardizem lisinopril, hydralazine, Coreg. - BP stable.   7. COPD - she had no acute exacerbation. She Will continue her Dulera, duo nebs as needed..   8. Hyperlipidemia - she will cont. Simvastatin.   9. GERD-she will continue Protonix  DISCHARGE CONDITIONS:   Stable  CONSULTS OBTAINED:  Treatment Team:  Marcina Millard, MD Mosetta Pigeon, MD  DRUG ALLERGIES:   Allergies  Allergen Reactions  . Penicillins Shortness Of Breath        . Codeine Itching  . Amlodipine Rash    Peripheral edema  . Ciprofloxacin Rash  . Sulfa Antibiotics Rash    DISCHARGE MEDICATIONS:   Allergies as of 05/10/2017      Reactions   Penicillins Shortness Of Breath  Codeine Itching   Amlodipine Rash   Peripheral edema   Ciprofloxacin Rash   Sulfa Antibiotics Rash      Medication List    TAKE these medications   acetaminophen 500 MG tablet Commonly known as:  TYLENOL Take 1,000 mg by mouth daily. Take 2 tabs (1000mg ) by mouth every morning.  Take 2 tablets in evening if needed.   aspirin EC 81 MG tablet Take 81 mg by mouth daily.   carvedilol 25 MG tablet Commonly known as:   COREG Take 1 tablet (25 mg total) by mouth 2 (two) times daily with a meal.   diltiazem 240 MG 24 hr capsule Commonly known as:  CARDIZEM CD Take 1 capsule (240 mg total) by mouth daily.   docusate sodium 100 MG capsule Commonly known as:  COLACE Take 100 mg by mouth 2 (two) times daily.   Ferrous Gluconate 324 (37.5 Fe) MG Tabs Take 1 tablet by mouth 2 (two) times daily.   fluticasone 50 MCG/ACT nasal spray Commonly known as:  FLONASE Place 2 sprays into both nostrils daily.   hydrALAZINE 100 MG tablet Commonly known as:  APRESOLINE Take 100 mg by mouth 2 (two) times daily.   ipratropium-albuterol 0.5-2.5 (3) MG/3ML Soln Commonly known as:  DUONEB Take 3 mLs by nebulization every 6 (six) hours as needed.   isosorbide mononitrate 120 MG 24 hr tablet Commonly known as:  IMDUR Take 1 tablet by mouth daily.   lisinopril 10 MG tablet Commonly known as:  PRINIVIL,ZESTRIL Take 10 mg by mouth daily.   Lutein 20 MG Tabs Take 1 tablet by mouth daily.   mometasone-formoterol 200-5 MCG/ACT Aero Commonly known as:  DULERA Inhale 2 puffs into the lungs 2 (two) times daily.   pantoprazole 40 MG tablet Commonly known as:  PROTONIX Take 40 mg by mouth daily.   PRESERVISION AREDS PO Take 1 tablet by mouth daily.   simvastatin 40 MG tablet Commonly known as:  ZOCOR Take 40 mg by mouth daily at 6 PM.   torsemide 20 MG tablet Commonly known as:  DEMADEX Take 2 tablets (40 mg total) by mouth daily. Take another 40 mg in the afternoon if feeling short of breath. What changed:  how much to take  additional instructions   traMADol 50 MG tablet Commonly known as:  ULTRAM Take 50 mg by mouth every 4 (four) hours as needed.         DISCHARGE INSTRUCTIONS:   DIET:  Cardiac diet  DISCHARGE CONDITION:  Stable  ACTIVITY:  Activity as tolerated  OXYGEN:  Home Oxygen: No.   Oxygen Delivery: room air  DISCHARGE LOCATION:  Home with Hospice   If you experience  worsening of your admission symptoms, develop shortness of breath, life threatening emergency, suicidal or homicidal thoughts you must seek medical attention immediately by calling 911 or calling your MD immediately  if symptoms less severe.  You Must read complete instructions/literature along with all the possible adverse reactions/side effects for all the Medicines you take and that have been prescribed to you. Take any new Medicines after you have completely understood and accpet all the possible adverse reactions/side effects.   Please note  You were cared for by a hospitalist during your hospital stay. If you have any questions about your discharge medications or the care you received while you were in the hospital after you are discharged, you can call the unit and asked to speak with the hospitalist on call if the hospitalist that  took care of you is not available. Once you are discharged, your primary care physician will handle any further medical issues. Please note that NO REFILLS for any discharge medications will be authorized once you are discharged, as it is imperative that you return to your primary care physician (or establish a relationship with a primary care physician if you do not have one) for your aftercare needs so that they can reassess your need for medications and monitor your lab values.     Today   Shortness of breath, abdominal bloating much improved. Feels much better. Wants to go home.  VITAL SIGNS:  Blood pressure 139/82, pulse (!) 136, temperature 98.4 F (36.9 C), temperature source Oral, resp. rate 18, height 4\' 9"  (1.448 m), weight 55.7 kg (122 lb 14.4 oz), SpO2 97 %.  I/O:   Intake/Output Summary (Last 24 hours) at 05/10/17 1502 Last data filed at 05/10/17 [id number]  Gross per 24 hour  Intake              600 ml  Output             2100 ml  Net            -1500 ml    PHYSICAL EXAMINATION:   GENERAL:  83 y.o.-year-old patient lying in bed in mild Resp.  Distress. EYES: Pupils equal, round, reactive to light and accommodation. No scleral icterus. Extraocular muscles intact.  HEENT: Head atraumatic, normocephalic. Oropharynx and nasopharynx clear.  NECK:  Supple, no jugular venous distention. No thyroid enlargement, no tenderness.  LUNGS: Good a/e b/l, no wheezing, minimal rales, No rhonchi. No use of accessory muscles of respiration.  CARDIOVASCULAR: S1, S2 normal. No murmurs, rubs, or gallops.  ABDOMEN: Soft, nontender, nondistended. Bowel sounds present. No organomegaly or mass.  EXTREMITIES: No cyanosis, clubbing, Trace edema b/l   NEUROLOGIC: Cranial nerves II through XII are intact. No focal Motor or sensory deficits b/l.   PSYCHIATRIC: The patient is alert and oriented x 3.  SKIN: No obvious rash, lesion, or ulcer.    DATA REVIEW:   CBC  Recent Labs Lab 05/06/17 0453  WBC 3.1*  HGB 8.5*  HCT 24.2*  PLT 170    Chemistries   Recent Labs Lab 05/04/17 1828  05/10/17 0611  NA 125*  < > 135  K 4.6  < > 3.8  CL 93*  < > 99*  CO2 24  < > 29  GLUCOSE 112*  < > 83  BUN 52*  < > 58*  CREATININE 2.05*  < > 2.27*  CALCIUM 8.6*  < > 8.9  AST 20  --   --   ALT 14  --   --   ALKPHOS 88  --   --   BILITOT 0.5  --   --   < > = values in this interval not displayed.  Cardiac Enzymes  Recent Labs Lab 05/05/17 1201  TROPONINI <0.03    Microbiology Results  Results for orders placed or performed during the hospital encounter of 04/08/17  MRSA PCR Screening     Status: None   Collection Time: 04/08/17  6:15 PM  Result Value Ref Range Status   MRSA by PCR NEGATIVE NEGATIVE Final    Comment:        The GeneXpert MRSA Assay (FDA approved for NASAL specimens only), is one component of a comprehensive MRSA colonization surveillance program. It is not intended to diagnose MRSA infection nor to guide or monitor treatment  for MRSA infections.     RADIOLOGY:  No results found.    Management plans discussed with the  patient, family and they are in agreement.  CODE STATUS:     Code Status Orders        Start     Ordered   05/04/17 2341  Do not attempt resuscitation (DNR)  Continuous    Question Answer Comment  In the event of cardiac or respiratory ARREST Do not call a "code blue"   In the event of cardiac or respiratory ARREST Do not perform Intubation, CPR, defibrillation or ACLS   In the event of cardiac or respiratory ARREST Use medication by any route, position, wound care, and other measures to relive pain and suffering. May use oxygen, suction and manual treatment of airway obstruction as needed for comfort.      05/04/17 2340    Advance Directive Documentation     Most Recent Value  Type of Advance Directive  Healthcare Power of Attorney, Living will  Pre-existing out of facility DNR order (yellow form or pink MOST form)  -  "MOST" Form in Place?  -      TOTAL TIME TAKING CARE OF THIS PATIENT: 40 minutes.    Houston Siren M.D on 05/10/2017 at 3:03 PM  Between 7am to 6pm - Pager - [phone]  After 6pm go to [email or web address] - Social research officer, government  [facility] [place] [facility]  Office  [phone]  CC: Primary care physician; Marisue Ivan, MD

## 2017-05-10 NOTE — Progress Notes (Signed)
New referral for [facility] services at home following discharge received from [facility]. Laura [name] is a 83 year old woman with a PMH of Atrial Fibrillation, CHF, CKD stage IV, Hyponatremia, GERD, macular degeneration, HTN and HLD admitted to [facility] on 6/14 for treatment of weight gain, shortness of breath and fleeting left sided chest pain. In the ED she was found to be leukopenic (WBC 3.3). Chest CT showed small pleural effusions as well as severe coronary artery calcification. She was treated with IV diuretics and supplimental oxygen. Palliative Medicine was consulted for goals of care. Patient and her daughter met with Palliative Medicine NP Wadie Lessen and have chosen for her to return home with the support of hospice services.  Writer spoke in the room with Laura [name] and her care giver Laura [name] prior to her discharge today. Hospice information and contact number given.  Writer then spoke via telephone to patient's daughter Laura [name] [phone]) to initiate education regarding hospice services, philosophy and team approach to care with good understanding voiced. Laura [name] has had several family members that have received hospice services, she is well versed in the support and poke highly of the care her family members had received. Contact numbers confirmed. No DME needs at this time. Patient information faxed to referral. Signed DNR accompanied patient at discharge, pateint discharged home via [place]. Hospital care team all aware. Flo Shanks RN, BSN, [place] and [facility] [place], [facility] [phone] c

## 2017-05-10 NOTE — Care Management Note (Signed)
Case Management Note  Patient Details  Name: Laura [name] MRN: [id number] Date of Birth: January 02, 1924  Subjective/Objective:  Spoke with patients daughter. Offered choice of hospice agencies. She chose [facility]. Referral to Dayna BarkerKaren robertson with [facility]. Discharging today. Daughter denies needs for DME.                   Action/Plan:   Expected Discharge Date:  05/10/17               Expected Discharge Plan:  Home w Hospice Care  In-House Referral:     Discharge planning Services  CM Consult  Post Acute Care Choice:  Hospice Choice offered to:  Adult Children  DME Arranged:    DME Agency:     HH Arranged:  Disease Management HH Agency:  [facility]  Status of Service:  Completed, signed off  If discussed at Long Length of Stay Meetings, dates discussed:    Additional Comments:  Marily MemosLisa M Mercades Bajaj, RN 05/10/2017, 11:29 AM

## 2017-05-10 NOTE — Plan of Care (Signed)
Problem: Safety: Goal: Ability to remain free from injury will improve Outcome: Progressing Pt will remain injury free while in hospital. Exit alarm is activated and patient agrees to call for help with activity.

## 2017-05-10 NOTE — Care Management Important Message (Signed)
Important Message  Patient Details  Name: Guinevere ScarletLucy N Hartin MRN: [id number] Date of Birth: 03/16/1924   Medicare Important Message Given:  Yes    Marily MemosLisa M Lillard Bailon, RN 05/10/2017, 12:01 PM

## 2017-05-10 NOTE — Progress Notes (Signed)
[facility], [place] 05/10/17  Subjective:  Patient known to our practice from previous admissions.  Consult requested for evaluation of hyponatremia and fluid overload This morning patient reports that her breathing is better, sitting up in chair Na improved to 135 UOP 2400 cc   Objective:  Vital signs in last 24 hours:  Temp:  [98.4 F (36.9 C)-98.6 F (37 C)] 98.4 F (36.9 C) (06/20 0628) Pulse Rate:  [105-136] 136 (06/20 0628) Resp:  [17-18] 18 (06/20 0628) BP: (121-149)/(71-96) 139/82 (06/20 0628) SpO2:  [97 %-98 %] 97 % (06/20 0628) Weight:  [55.7 kg (122 lb 14.4 oz)] 55.7 kg (122 lb 14.4 oz) (06/20 0500)  Weight change: -1.27 kg (-2 lb 12.8 oz) Filed Weights   05/08/17 0500 05/09/17 0303 05/10/17 0500  Weight: 56.8 kg (125 lb 3 oz) 57 kg (125 lb 11.2 oz) 55.7 kg (122 lb 14.4 oz)    Intake/Output:    Intake/Output Summary (Last 24 hours) at 05/10/17 1552 Last data filed at 05/10/17 1610  Gross per 24 hour  Intake              600 ml  Output             1600 ml  Net            -1000 ml     Physical Exam: General: No acute distress,   HEENT Anicteric, moist oral mucous membranes  Neck supple  Pulm/lungs Clear b/l today; room air  CVS/Heart irregular, 2/6 systolic murmur  Abdomen:  Soft, tympanic, mildly distended  Extremities: Trace  dependent edema  Neurologic: Alert, able to answer questions, decreased hearing  Skin: Normal turgor, no acute rashes          Basic Metabolic Panel:   Recent Labs Lab 05/06/17 0453 05/07/17 0448 05/08/17 0508 05/09/17 0614 05/10/17 0611  NA 130* 131* 126* 131* 135  K 3.9 3.7 4.1 3.8 3.8  CL 95* 97* 93* 96* 99*  CO2 26 27 26 28 29   GLUCOSE 88 87 83 87 83  BUN 53* 51* 53* 57* 58*  CREATININE 1.92* 2.07* 1.99* 2.31* 2.27*  CALCIUM 8.8* 8.5* 8.4* 8.7* 8.9     CBC:  Recent Labs Lab 05/04/17 1828 05/05/17 0547 05/06/17 0453  WBC 3.3* 3.3* 3.1*  NEUTROABS 2.3  --   --   HGB 8.6*  8.3* 8.5*  HCT 25.7* 24.3* 24.2*  MCV 90.5 88.8 88.3  PLT 182 168 170     No results found for: HEPBSAG, HEPBSAB, HEPBIGM    Microbiology:  No results found for this or any previous visit (from the past 240 hour(s)).  Coagulation Studies: No results for input(s): LABPROT, INR in the last 72 hours.  Urinalysis: No results for input(s): COLORURINE, LABSPEC, PHURINE, GLUCOSEU, HGBUR, BILIRUBINUR, KETONESUR, PROTEINUR, UROBILINOGEN, NITRITE, LEUKOCYTESUR in the last 72 hours.  Invalid input(s): APPERANCEUR    Imaging: No results found.   Medications:   . sodium chloride     . acetaminophen  1,000 mg Oral Daily  . aspirin EC  81 mg Oral Daily  . carvedilol  25 mg Oral BID WC  . diltiazem  240 mg Oral Daily  . docusate sodium  100 mg Oral BID  . ferrous gluconate  324 mg Oral BID  . fluticasone  2 spray Each Nare Daily  . furosemide  40 mg Oral BID  . heparin  5,000 Units Subcutaneous Q8H  . hydrALAZINE  100 mg Oral BID  .  isosorbide mononitrate  120 mg Oral Daily  . lisinopril  10 mg Oral Daily  . mometasone-formoterol  2 puff Inhalation BID  . pantoprazole  40 mg Oral Daily  . simvastatin  40 mg Oral q1800  . sodium chloride flush  3 mL Intravenous Q12H   sodium chloride, ipratropium-albuterol, ondansetron **OR** ondansetron (ZOFRAN) IV, sodium chloride flush  Assessment/ Plan:  83 y.o. female with hypertension, GERD, chronic kidney disease stage IV, congestive heart failure, chronic atrial fibrillation presents for worsening shortness of breath CT scan suggests some findings of fluid overload and probably hypertension Most recent echo from 5/20 shows normal LV EF of 60-65%.  Moderate mitral regurgitation, mild LVH, normal right ventricular systolic function  1.  Hyponatremia 2.  Shortness of breath 3.  Chronic kidney disease stage IV.  Baseline creatinine 1.99/GFR 21  The cause for hyponatremia is not entirely clear but likely combination of SIADH and  congestive heart failure.      Plan: Na improved to normal Follow up outpatient 05/31/2017 2 PM with Dr Wynelle LinkKolluru    LOS: 6 [facility],Kyce Ging 6/20/20183:52 PM  [facility] [place], [place] [phone]

## 2017-05-11 ENCOUNTER — Other Ambulatory Visit: Payer: Self-pay | Admitting: *Deleted

## 2017-05-11 ENCOUNTER — Encounter: Payer: Self-pay | Admitting: *Deleted

## 2017-05-11 DIAGNOSIS — I272 Pulmonary hypertension, unspecified: Secondary | ICD-10-CM | POA: Diagnosis not present

## 2017-05-11 DIAGNOSIS — I130 Hypertensive heart and chronic kidney disease with heart failure and stage 1 through stage 4 chronic kidney disease, or unspecified chronic kidney disease: Secondary | ICD-10-CM | POA: Diagnosis not present

## 2017-05-11 DIAGNOSIS — J9601 Acute respiratory failure with hypoxia: Secondary | ICD-10-CM | POA: Diagnosis not present

## 2017-05-11 DIAGNOSIS — I482 Chronic atrial fibrillation: Secondary | ICD-10-CM | POA: Diagnosis not present

## 2017-05-11 DIAGNOSIS — I5033 Acute on chronic diastolic (congestive) heart failure: Secondary | ICD-10-CM | POA: Diagnosis not present

## 2017-05-11 DIAGNOSIS — N184 Chronic kidney disease, stage 4 (severe): Secondary | ICD-10-CM | POA: Diagnosis not present

## 2017-05-11 DIAGNOSIS — K219 Gastro-esophageal reflux disease without esophagitis: Secondary | ICD-10-CM | POA: Diagnosis not present

## 2017-05-11 DIAGNOSIS — E871 Hypo-osmolality and hyponatremia: Secondary | ICD-10-CM | POA: Diagnosis not present

## 2017-05-11 DIAGNOSIS — I471 Supraventricular tachycardia: Secondary | ICD-10-CM | POA: Diagnosis not present

## 2017-05-11 DIAGNOSIS — H3530 Unspecified macular degeneration: Secondary | ICD-10-CM | POA: Diagnosis not present

## 2017-05-11 DIAGNOSIS — Z9981 Dependence on supplemental oxygen: Secondary | ICD-10-CM | POA: Diagnosis not present

## 2017-05-11 DIAGNOSIS — E785 Hyperlipidemia, unspecified: Secondary | ICD-10-CM | POA: Diagnosis not present

## 2017-05-11 DIAGNOSIS — J449 Chronic obstructive pulmonary disease, unspecified: Secondary | ICD-10-CM | POA: Diagnosis not present

## 2017-05-11 DIAGNOSIS — D649 Anemia, unspecified: Secondary | ICD-10-CM | POA: Diagnosis not present

## 2017-05-11 NOTE — Patient Outreach (Signed)
Telephone encounter successful, spoke with pt's daughter Lucindy(on consent form, preferred contact) who called earlier today, no voice message left, HIPAA provided on pt.    Daughter reports pt was discharged home yesterday to which RN CM states was aware, reports  Hospice to have a meeting this am in pt's home.  Daughter reports reason for [facility] with pt now having  hospice services.   RN CM discussed with daughter planned  to follow up with her today to discuss this, view in Epic saw pt was to discharge home with Hospice (comfort care)- therefore plan to close pt's case.     Plan:  As discussed with daughter, plan to close case- pt to receive home Hospice.             Care plan updated.           Plan to inform Dr. Burnadette PopLinthavong of discharge from community nurse case management services, case               Closure letter to be sent.            Plan to inform [facility] care management assistant to close case.    Shayne Alkenose M.   Pierzchala RN CCM [facility] Care Management  [phone]

## 2017-05-12 ENCOUNTER — Ambulatory Visit: Payer: Medicare Other | Admitting: *Deleted

## 2017-05-12 DIAGNOSIS — I272 Pulmonary hypertension, unspecified: Secondary | ICD-10-CM | POA: Diagnosis not present

## 2017-05-12 DIAGNOSIS — N184 Chronic kidney disease, stage 4 (severe): Secondary | ICD-10-CM | POA: Diagnosis not present

## 2017-05-12 DIAGNOSIS — J9601 Acute respiratory failure with hypoxia: Secondary | ICD-10-CM | POA: Diagnosis not present

## 2017-05-12 DIAGNOSIS — I130 Hypertensive heart and chronic kidney disease with heart failure and stage 1 through stage 4 chronic kidney disease, or unspecified chronic kidney disease: Secondary | ICD-10-CM | POA: Diagnosis not present

## 2017-05-12 DIAGNOSIS — I5033 Acute on chronic diastolic (congestive) heart failure: Secondary | ICD-10-CM | POA: Diagnosis not present

## 2017-05-12 DIAGNOSIS — I482 Chronic atrial fibrillation: Secondary | ICD-10-CM | POA: Diagnosis not present

## 2017-05-15 DIAGNOSIS — N184 Chronic kidney disease, stage 4 (severe): Secondary | ICD-10-CM | POA: Diagnosis not present

## 2017-05-15 DIAGNOSIS — I5033 Acute on chronic diastolic (congestive) heart failure: Secondary | ICD-10-CM | POA: Diagnosis not present

## 2017-05-15 DIAGNOSIS — I130 Hypertensive heart and chronic kidney disease with heart failure and stage 1 through stage 4 chronic kidney disease, or unspecified chronic kidney disease: Secondary | ICD-10-CM | POA: Diagnosis not present

## 2017-05-15 DIAGNOSIS — I482 Chronic atrial fibrillation: Secondary | ICD-10-CM | POA: Diagnosis not present

## 2017-05-15 DIAGNOSIS — I272 Pulmonary hypertension, unspecified: Secondary | ICD-10-CM | POA: Diagnosis not present

## 2017-05-15 DIAGNOSIS — J9601 Acute respiratory failure with hypoxia: Secondary | ICD-10-CM | POA: Diagnosis not present

## 2017-05-17 ENCOUNTER — Ambulatory Visit: Payer: Medicare Other | Admitting: Family

## 2017-05-17 DIAGNOSIS — I272 Pulmonary hypertension, unspecified: Secondary | ICD-10-CM | POA: Diagnosis not present

## 2017-05-17 DIAGNOSIS — N184 Chronic kidney disease, stage 4 (severe): Secondary | ICD-10-CM | POA: Diagnosis not present

## 2017-05-17 DIAGNOSIS — I5033 Acute on chronic diastolic (congestive) heart failure: Secondary | ICD-10-CM | POA: Diagnosis not present

## 2017-05-17 DIAGNOSIS — J9601 Acute respiratory failure with hypoxia: Secondary | ICD-10-CM | POA: Diagnosis not present

## 2017-05-17 DIAGNOSIS — I130 Hypertensive heart and chronic kidney disease with heart failure and stage 1 through stage 4 chronic kidney disease, or unspecified chronic kidney disease: Secondary | ICD-10-CM | POA: Diagnosis not present

## 2017-05-17 DIAGNOSIS — I482 Chronic atrial fibrillation: Secondary | ICD-10-CM | POA: Diagnosis not present

## 2017-05-19 DIAGNOSIS — I5033 Acute on chronic diastolic (congestive) heart failure: Secondary | ICD-10-CM | POA: Diagnosis not present

## 2017-05-19 DIAGNOSIS — I272 Pulmonary hypertension, unspecified: Secondary | ICD-10-CM | POA: Diagnosis not present

## 2017-05-19 DIAGNOSIS — I482 Chronic atrial fibrillation: Secondary | ICD-10-CM | POA: Diagnosis not present

## 2017-05-19 DIAGNOSIS — I130 Hypertensive heart and chronic kidney disease with heart failure and stage 1 through stage 4 chronic kidney disease, or unspecified chronic kidney disease: Secondary | ICD-10-CM | POA: Diagnosis not present

## 2017-05-19 DIAGNOSIS — J9601 Acute respiratory failure with hypoxia: Secondary | ICD-10-CM | POA: Diagnosis not present

## 2017-05-19 DIAGNOSIS — N184 Chronic kidney disease, stage 4 (severe): Secondary | ICD-10-CM | POA: Diagnosis not present

## 2017-05-21 DIAGNOSIS — E871 Hypo-osmolality and hyponatremia: Secondary | ICD-10-CM | POA: Diagnosis not present

## 2017-05-21 DIAGNOSIS — J9601 Acute respiratory failure with hypoxia: Secondary | ICD-10-CM | POA: Diagnosis not present

## 2017-05-21 DIAGNOSIS — I5033 Acute on chronic diastolic (congestive) heart failure: Secondary | ICD-10-CM | POA: Diagnosis not present

## 2017-05-21 DIAGNOSIS — J449 Chronic obstructive pulmonary disease, unspecified: Secondary | ICD-10-CM | POA: Diagnosis not present

## 2017-05-21 DIAGNOSIS — D649 Anemia, unspecified: Secondary | ICD-10-CM | POA: Diagnosis not present

## 2017-05-21 DIAGNOSIS — K219 Gastro-esophageal reflux disease without esophagitis: Secondary | ICD-10-CM | POA: Diagnosis not present

## 2017-05-21 DIAGNOSIS — I130 Hypertensive heart and chronic kidney disease with heart failure and stage 1 through stage 4 chronic kidney disease, or unspecified chronic kidney disease: Secondary | ICD-10-CM | POA: Diagnosis not present

## 2017-05-21 DIAGNOSIS — I272 Pulmonary hypertension, unspecified: Secondary | ICD-10-CM | POA: Diagnosis not present

## 2017-05-21 DIAGNOSIS — I471 Supraventricular tachycardia: Secondary | ICD-10-CM | POA: Diagnosis not present

## 2017-05-21 DIAGNOSIS — I482 Chronic atrial fibrillation: Secondary | ICD-10-CM | POA: Diagnosis not present

## 2017-05-21 DIAGNOSIS — H3530 Unspecified macular degeneration: Secondary | ICD-10-CM | POA: Diagnosis not present

## 2017-05-21 DIAGNOSIS — Z9981 Dependence on supplemental oxygen: Secondary | ICD-10-CM | POA: Diagnosis not present

## 2017-05-21 DIAGNOSIS — E785 Hyperlipidemia, unspecified: Secondary | ICD-10-CM | POA: Diagnosis not present

## 2017-05-21 DIAGNOSIS — N184 Chronic kidney disease, stage 4 (severe): Secondary | ICD-10-CM | POA: Diagnosis not present

## 2017-05-22 DIAGNOSIS — I272 Pulmonary hypertension, unspecified: Secondary | ICD-10-CM | POA: Diagnosis not present

## 2017-05-22 DIAGNOSIS — I5033 Acute on chronic diastolic (congestive) heart failure: Secondary | ICD-10-CM | POA: Diagnosis not present

## 2017-05-22 DIAGNOSIS — J9601 Acute respiratory failure with hypoxia: Secondary | ICD-10-CM | POA: Diagnosis not present

## 2017-05-22 DIAGNOSIS — I130 Hypertensive heart and chronic kidney disease with heart failure and stage 1 through stage 4 chronic kidney disease, or unspecified chronic kidney disease: Secondary | ICD-10-CM | POA: Diagnosis not present

## 2017-05-22 DIAGNOSIS — I482 Chronic atrial fibrillation: Secondary | ICD-10-CM | POA: Diagnosis not present

## 2017-05-22 DIAGNOSIS — N184 Chronic kidney disease, stage 4 (severe): Secondary | ICD-10-CM | POA: Diagnosis not present

## 2017-05-23 DIAGNOSIS — J9601 Acute respiratory failure with hypoxia: Secondary | ICD-10-CM | POA: Diagnosis not present

## 2017-05-23 DIAGNOSIS — I482 Chronic atrial fibrillation: Secondary | ICD-10-CM | POA: Diagnosis not present

## 2017-05-23 DIAGNOSIS — I272 Pulmonary hypertension, unspecified: Secondary | ICD-10-CM | POA: Diagnosis not present

## 2017-05-23 DIAGNOSIS — N184 Chronic kidney disease, stage 4 (severe): Secondary | ICD-10-CM | POA: Diagnosis not present

## 2017-05-23 DIAGNOSIS — I130 Hypertensive heart and chronic kidney disease with heart failure and stage 1 through stage 4 chronic kidney disease, or unspecified chronic kidney disease: Secondary | ICD-10-CM | POA: Diagnosis not present

## 2017-05-23 DIAGNOSIS — I5033 Acute on chronic diastolic (congestive) heart failure: Secondary | ICD-10-CM | POA: Diagnosis not present

## 2017-05-25 DIAGNOSIS — N184 Chronic kidney disease, stage 4 (severe): Secondary | ICD-10-CM | POA: Diagnosis not present

## 2017-05-25 DIAGNOSIS — I482 Chronic atrial fibrillation: Secondary | ICD-10-CM | POA: Diagnosis not present

## 2017-05-25 DIAGNOSIS — I130 Hypertensive heart and chronic kidney disease with heart failure and stage 1 through stage 4 chronic kidney disease, or unspecified chronic kidney disease: Secondary | ICD-10-CM | POA: Diagnosis not present

## 2017-05-25 DIAGNOSIS — I272 Pulmonary hypertension, unspecified: Secondary | ICD-10-CM | POA: Diagnosis not present

## 2017-05-25 DIAGNOSIS — I5033 Acute on chronic diastolic (congestive) heart failure: Secondary | ICD-10-CM | POA: Diagnosis not present

## 2017-05-25 DIAGNOSIS — J9601 Acute respiratory failure with hypoxia: Secondary | ICD-10-CM | POA: Diagnosis not present

## 2017-05-26 DIAGNOSIS — I482 Chronic atrial fibrillation: Secondary | ICD-10-CM | POA: Diagnosis not present

## 2017-05-26 DIAGNOSIS — I272 Pulmonary hypertension, unspecified: Secondary | ICD-10-CM | POA: Diagnosis not present

## 2017-05-26 DIAGNOSIS — N184 Chronic kidney disease, stage 4 (severe): Secondary | ICD-10-CM | POA: Diagnosis not present

## 2017-05-26 DIAGNOSIS — I5033 Acute on chronic diastolic (congestive) heart failure: Secondary | ICD-10-CM | POA: Diagnosis not present

## 2017-05-26 DIAGNOSIS — I130 Hypertensive heart and chronic kidney disease with heart failure and stage 1 through stage 4 chronic kidney disease, or unspecified chronic kidney disease: Secondary | ICD-10-CM | POA: Diagnosis not present

## 2017-05-26 DIAGNOSIS — J9601 Acute respiratory failure with hypoxia: Secondary | ICD-10-CM | POA: Diagnosis not present

## 2017-05-29 DIAGNOSIS — J9601 Acute respiratory failure with hypoxia: Secondary | ICD-10-CM | POA: Diagnosis not present

## 2017-05-29 DIAGNOSIS — I5033 Acute on chronic diastolic (congestive) heart failure: Secondary | ICD-10-CM | POA: Diagnosis not present

## 2017-05-29 DIAGNOSIS — N184 Chronic kidney disease, stage 4 (severe): Secondary | ICD-10-CM | POA: Diagnosis not present

## 2017-05-29 DIAGNOSIS — I482 Chronic atrial fibrillation: Secondary | ICD-10-CM | POA: Diagnosis not present

## 2017-05-29 DIAGNOSIS — I130 Hypertensive heart and chronic kidney disease with heart failure and stage 1 through stage 4 chronic kidney disease, or unspecified chronic kidney disease: Secondary | ICD-10-CM | POA: Diagnosis not present

## 2017-05-29 DIAGNOSIS — I272 Pulmonary hypertension, unspecified: Secondary | ICD-10-CM | POA: Diagnosis not present

## 2017-05-30 DIAGNOSIS — I482 Chronic atrial fibrillation: Secondary | ICD-10-CM | POA: Diagnosis not present

## 2017-05-30 DIAGNOSIS — J9601 Acute respiratory failure with hypoxia: Secondary | ICD-10-CM | POA: Diagnosis not present

## 2017-05-30 DIAGNOSIS — I5033 Acute on chronic diastolic (congestive) heart failure: Secondary | ICD-10-CM | POA: Diagnosis not present

## 2017-05-30 DIAGNOSIS — N184 Chronic kidney disease, stage 4 (severe): Secondary | ICD-10-CM | POA: Diagnosis not present

## 2017-05-30 DIAGNOSIS — I130 Hypertensive heart and chronic kidney disease with heart failure and stage 1 through stage 4 chronic kidney disease, or unspecified chronic kidney disease: Secondary | ICD-10-CM | POA: Diagnosis not present

## 2017-05-30 DIAGNOSIS — I272 Pulmonary hypertension, unspecified: Secondary | ICD-10-CM | POA: Diagnosis not present

## 2017-05-31 DIAGNOSIS — I5033 Acute on chronic diastolic (congestive) heart failure: Secondary | ICD-10-CM | POA: Diagnosis not present

## 2017-05-31 DIAGNOSIS — I482 Chronic atrial fibrillation: Secondary | ICD-10-CM | POA: Diagnosis not present

## 2017-05-31 DIAGNOSIS — I272 Pulmonary hypertension, unspecified: Secondary | ICD-10-CM | POA: Diagnosis not present

## 2017-05-31 DIAGNOSIS — I130 Hypertensive heart and chronic kidney disease with heart failure and stage 1 through stage 4 chronic kidney disease, or unspecified chronic kidney disease: Secondary | ICD-10-CM | POA: Diagnosis not present

## 2017-05-31 DIAGNOSIS — N184 Chronic kidney disease, stage 4 (severe): Secondary | ICD-10-CM | POA: Diagnosis not present

## 2017-05-31 DIAGNOSIS — J9601 Acute respiratory failure with hypoxia: Secondary | ICD-10-CM | POA: Diagnosis not present

## 2017-06-01 DIAGNOSIS — I482 Chronic atrial fibrillation: Secondary | ICD-10-CM | POA: Diagnosis not present

## 2017-06-01 DIAGNOSIS — I130 Hypertensive heart and chronic kidney disease with heart failure and stage 1 through stage 4 chronic kidney disease, or unspecified chronic kidney disease: Secondary | ICD-10-CM | POA: Diagnosis not present

## 2017-06-01 DIAGNOSIS — J9601 Acute respiratory failure with hypoxia: Secondary | ICD-10-CM | POA: Diagnosis not present

## 2017-06-01 DIAGNOSIS — I272 Pulmonary hypertension, unspecified: Secondary | ICD-10-CM | POA: Diagnosis not present

## 2017-06-01 DIAGNOSIS — N184 Chronic kidney disease, stage 4 (severe): Secondary | ICD-10-CM | POA: Diagnosis not present

## 2017-06-01 DIAGNOSIS — I5033 Acute on chronic diastolic (congestive) heart failure: Secondary | ICD-10-CM | POA: Diagnosis not present

## 2017-06-02 DIAGNOSIS — I5033 Acute on chronic diastolic (congestive) heart failure: Secondary | ICD-10-CM | POA: Diagnosis not present

## 2017-06-02 DIAGNOSIS — N184 Chronic kidney disease, stage 4 (severe): Secondary | ICD-10-CM | POA: Diagnosis not present

## 2017-06-02 DIAGNOSIS — I272 Pulmonary hypertension, unspecified: Secondary | ICD-10-CM | POA: Diagnosis not present

## 2017-06-02 DIAGNOSIS — I482 Chronic atrial fibrillation: Secondary | ICD-10-CM | POA: Diagnosis not present

## 2017-06-02 DIAGNOSIS — J9601 Acute respiratory failure with hypoxia: Secondary | ICD-10-CM | POA: Diagnosis not present

## 2017-06-02 DIAGNOSIS — I130 Hypertensive heart and chronic kidney disease with heart failure and stage 1 through stage 4 chronic kidney disease, or unspecified chronic kidney disease: Secondary | ICD-10-CM | POA: Diagnosis not present

## 2017-06-05 DIAGNOSIS — N184 Chronic kidney disease, stage 4 (severe): Secondary | ICD-10-CM | POA: Diagnosis not present

## 2017-06-05 DIAGNOSIS — I482 Chronic atrial fibrillation: Secondary | ICD-10-CM | POA: Diagnosis not present

## 2017-06-05 DIAGNOSIS — I5033 Acute on chronic diastolic (congestive) heart failure: Secondary | ICD-10-CM | POA: Diagnosis not present

## 2017-06-05 DIAGNOSIS — J9601 Acute respiratory failure with hypoxia: Secondary | ICD-10-CM | POA: Diagnosis not present

## 2017-06-05 DIAGNOSIS — I272 Pulmonary hypertension, unspecified: Secondary | ICD-10-CM | POA: Diagnosis not present

## 2017-06-05 DIAGNOSIS — I130 Hypertensive heart and chronic kidney disease with heart failure and stage 1 through stage 4 chronic kidney disease, or unspecified chronic kidney disease: Secondary | ICD-10-CM | POA: Diagnosis not present

## 2017-06-06 DIAGNOSIS — I272 Pulmonary hypertension, unspecified: Secondary | ICD-10-CM | POA: Diagnosis not present

## 2017-06-06 DIAGNOSIS — I130 Hypertensive heart and chronic kidney disease with heart failure and stage 1 through stage 4 chronic kidney disease, or unspecified chronic kidney disease: Secondary | ICD-10-CM | POA: Diagnosis not present

## 2017-06-06 DIAGNOSIS — N189 Chronic kidney disease, unspecified: Secondary | ICD-10-CM | POA: Diagnosis not present

## 2017-06-06 DIAGNOSIS — I5033 Acute on chronic diastolic (congestive) heart failure: Secondary | ICD-10-CM | POA: Diagnosis not present

## 2017-06-06 DIAGNOSIS — N184 Chronic kidney disease, stage 4 (severe): Secondary | ICD-10-CM | POA: Diagnosis not present

## 2017-06-06 DIAGNOSIS — I482 Chronic atrial fibrillation: Secondary | ICD-10-CM | POA: Diagnosis not present

## 2017-06-06 DIAGNOSIS — I4891 Unspecified atrial fibrillation: Secondary | ICD-10-CM | POA: Diagnosis not present

## 2017-06-06 DIAGNOSIS — J9601 Acute respiratory failure with hypoxia: Secondary | ICD-10-CM | POA: Diagnosis not present

## 2017-06-07 DIAGNOSIS — I272 Pulmonary hypertension, unspecified: Secondary | ICD-10-CM | POA: Diagnosis not present

## 2017-06-07 DIAGNOSIS — I130 Hypertensive heart and chronic kidney disease with heart failure and stage 1 through stage 4 chronic kidney disease, or unspecified chronic kidney disease: Secondary | ICD-10-CM | POA: Diagnosis not present

## 2017-06-07 DIAGNOSIS — J9601 Acute respiratory failure with hypoxia: Secondary | ICD-10-CM | POA: Diagnosis not present

## 2017-06-07 DIAGNOSIS — N184 Chronic kidney disease, stage 4 (severe): Secondary | ICD-10-CM | POA: Diagnosis not present

## 2017-06-07 DIAGNOSIS — I5033 Acute on chronic diastolic (congestive) heart failure: Secondary | ICD-10-CM | POA: Diagnosis not present

## 2017-06-07 DIAGNOSIS — I482 Chronic atrial fibrillation: Secondary | ICD-10-CM | POA: Diagnosis not present

## 2017-06-08 DIAGNOSIS — I130 Hypertensive heart and chronic kidney disease with heart failure and stage 1 through stage 4 chronic kidney disease, or unspecified chronic kidney disease: Secondary | ICD-10-CM | POA: Diagnosis not present

## 2017-06-08 DIAGNOSIS — I5033 Acute on chronic diastolic (congestive) heart failure: Secondary | ICD-10-CM | POA: Diagnosis not present

## 2017-06-08 DIAGNOSIS — I482 Chronic atrial fibrillation: Secondary | ICD-10-CM | POA: Diagnosis not present

## 2017-06-08 DIAGNOSIS — J9601 Acute respiratory failure with hypoxia: Secondary | ICD-10-CM | POA: Diagnosis not present

## 2017-06-08 DIAGNOSIS — I272 Pulmonary hypertension, unspecified: Secondary | ICD-10-CM | POA: Diagnosis not present

## 2017-06-08 DIAGNOSIS — N184 Chronic kidney disease, stage 4 (severe): Secondary | ICD-10-CM | POA: Diagnosis not present

## 2017-06-09 DIAGNOSIS — I272 Pulmonary hypertension, unspecified: Secondary | ICD-10-CM | POA: Diagnosis not present

## 2017-06-09 DIAGNOSIS — J9601 Acute respiratory failure with hypoxia: Secondary | ICD-10-CM | POA: Diagnosis not present

## 2017-06-09 DIAGNOSIS — I5033 Acute on chronic diastolic (congestive) heart failure: Secondary | ICD-10-CM | POA: Diagnosis not present

## 2017-06-09 DIAGNOSIS — I130 Hypertensive heart and chronic kidney disease with heart failure and stage 1 through stage 4 chronic kidney disease, or unspecified chronic kidney disease: Secondary | ICD-10-CM | POA: Diagnosis not present

## 2017-06-09 DIAGNOSIS — N184 Chronic kidney disease, stage 4 (severe): Secondary | ICD-10-CM | POA: Diagnosis not present

## 2017-06-09 DIAGNOSIS — I482 Chronic atrial fibrillation: Secondary | ICD-10-CM | POA: Diagnosis not present

## 2017-06-12 DIAGNOSIS — I272 Pulmonary hypertension, unspecified: Secondary | ICD-10-CM | POA: Diagnosis not present

## 2017-06-12 DIAGNOSIS — I130 Hypertensive heart and chronic kidney disease with heart failure and stage 1 through stage 4 chronic kidney disease, or unspecified chronic kidney disease: Secondary | ICD-10-CM | POA: Diagnosis not present

## 2017-06-12 DIAGNOSIS — N184 Chronic kidney disease, stage 4 (severe): Secondary | ICD-10-CM | POA: Diagnosis not present

## 2017-06-12 DIAGNOSIS — I482 Chronic atrial fibrillation: Secondary | ICD-10-CM | POA: Diagnosis not present

## 2017-06-12 DIAGNOSIS — I5033 Acute on chronic diastolic (congestive) heart failure: Secondary | ICD-10-CM | POA: Diagnosis not present

## 2017-06-12 DIAGNOSIS — J9601 Acute respiratory failure with hypoxia: Secondary | ICD-10-CM | POA: Diagnosis not present

## 2017-06-13 DIAGNOSIS — I272 Pulmonary hypertension, unspecified: Secondary | ICD-10-CM | POA: Diagnosis not present

## 2017-06-13 DIAGNOSIS — N184 Chronic kidney disease, stage 4 (severe): Secondary | ICD-10-CM | POA: Diagnosis not present

## 2017-06-13 DIAGNOSIS — I5033 Acute on chronic diastolic (congestive) heart failure: Secondary | ICD-10-CM | POA: Diagnosis not present

## 2017-06-13 DIAGNOSIS — I130 Hypertensive heart and chronic kidney disease with heart failure and stage 1 through stage 4 chronic kidney disease, or unspecified chronic kidney disease: Secondary | ICD-10-CM | POA: Diagnosis not present

## 2017-06-13 DIAGNOSIS — I482 Chronic atrial fibrillation: Secondary | ICD-10-CM | POA: Diagnosis not present

## 2017-06-13 DIAGNOSIS — J9601 Acute respiratory failure with hypoxia: Secondary | ICD-10-CM | POA: Diagnosis not present

## 2017-06-14 DIAGNOSIS — I272 Pulmonary hypertension, unspecified: Secondary | ICD-10-CM | POA: Diagnosis not present

## 2017-06-14 DIAGNOSIS — I482 Chronic atrial fibrillation: Secondary | ICD-10-CM | POA: Diagnosis not present

## 2017-06-14 DIAGNOSIS — I5033 Acute on chronic diastolic (congestive) heart failure: Secondary | ICD-10-CM | POA: Diagnosis not present

## 2017-06-14 DIAGNOSIS — J9601 Acute respiratory failure with hypoxia: Secondary | ICD-10-CM | POA: Diagnosis not present

## 2017-06-14 DIAGNOSIS — I130 Hypertensive heart and chronic kidney disease with heart failure and stage 1 through stage 4 chronic kidney disease, or unspecified chronic kidney disease: Secondary | ICD-10-CM | POA: Diagnosis not present

## 2017-06-14 DIAGNOSIS — N184 Chronic kidney disease, stage 4 (severe): Secondary | ICD-10-CM | POA: Diagnosis not present

## 2017-06-15 DIAGNOSIS — I5033 Acute on chronic diastolic (congestive) heart failure: Secondary | ICD-10-CM | POA: Diagnosis not present

## 2017-06-15 DIAGNOSIS — I130 Hypertensive heart and chronic kidney disease with heart failure and stage 1 through stage 4 chronic kidney disease, or unspecified chronic kidney disease: Secondary | ICD-10-CM | POA: Diagnosis not present

## 2017-06-15 DIAGNOSIS — I272 Pulmonary hypertension, unspecified: Secondary | ICD-10-CM | POA: Diagnosis not present

## 2017-06-15 DIAGNOSIS — N184 Chronic kidney disease, stage 4 (severe): Secondary | ICD-10-CM | POA: Diagnosis not present

## 2017-06-15 DIAGNOSIS — I482 Chronic atrial fibrillation: Secondary | ICD-10-CM | POA: Diagnosis not present

## 2017-06-15 DIAGNOSIS — J9601 Acute respiratory failure with hypoxia: Secondary | ICD-10-CM | POA: Diagnosis not present

## 2017-06-18 DIAGNOSIS — I272 Pulmonary hypertension, unspecified: Secondary | ICD-10-CM | POA: Diagnosis not present

## 2017-06-18 DIAGNOSIS — I130 Hypertensive heart and chronic kidney disease with heart failure and stage 1 through stage 4 chronic kidney disease, or unspecified chronic kidney disease: Secondary | ICD-10-CM | POA: Diagnosis not present

## 2017-06-18 DIAGNOSIS — J9601 Acute respiratory failure with hypoxia: Secondary | ICD-10-CM | POA: Diagnosis not present

## 2017-06-18 DIAGNOSIS — I5033 Acute on chronic diastolic (congestive) heart failure: Secondary | ICD-10-CM | POA: Diagnosis not present

## 2017-06-18 DIAGNOSIS — I482 Chronic atrial fibrillation: Secondary | ICD-10-CM | POA: Diagnosis not present

## 2017-06-18 DIAGNOSIS — N184 Chronic kidney disease, stage 4 (severe): Secondary | ICD-10-CM | POA: Diagnosis not present

## 2017-06-21 DIAGNOSIS — H3530 Unspecified macular degeneration: Secondary | ICD-10-CM | POA: Diagnosis not present

## 2017-06-21 DIAGNOSIS — I471 Supraventricular tachycardia: Secondary | ICD-10-CM | POA: Diagnosis not present

## 2017-06-21 DIAGNOSIS — E871 Hypo-osmolality and hyponatremia: Secondary | ICD-10-CM | POA: Diagnosis not present

## 2017-06-21 DIAGNOSIS — I482 Chronic atrial fibrillation: Secondary | ICD-10-CM | POA: Diagnosis not present

## 2017-06-21 DIAGNOSIS — N184 Chronic kidney disease, stage 4 (severe): Secondary | ICD-10-CM | POA: Diagnosis not present

## 2017-06-21 DIAGNOSIS — I272 Pulmonary hypertension, unspecified: Secondary | ICD-10-CM | POA: Diagnosis not present

## 2017-06-21 DIAGNOSIS — D649 Anemia, unspecified: Secondary | ICD-10-CM | POA: Diagnosis not present

## 2017-06-21 DIAGNOSIS — J449 Chronic obstructive pulmonary disease, unspecified: Secondary | ICD-10-CM | POA: Diagnosis not present

## 2017-06-21 DIAGNOSIS — J9601 Acute respiratory failure with hypoxia: Secondary | ICD-10-CM | POA: Diagnosis not present

## 2017-06-21 DIAGNOSIS — Z9981 Dependence on supplemental oxygen: Secondary | ICD-10-CM | POA: Diagnosis not present

## 2017-06-21 DIAGNOSIS — I130 Hypertensive heart and chronic kidney disease with heart failure and stage 1 through stage 4 chronic kidney disease, or unspecified chronic kidney disease: Secondary | ICD-10-CM | POA: Diagnosis not present

## 2017-06-21 DIAGNOSIS — I5033 Acute on chronic diastolic (congestive) heart failure: Secondary | ICD-10-CM | POA: Diagnosis not present

## 2017-06-21 DIAGNOSIS — K219 Gastro-esophageal reflux disease without esophagitis: Secondary | ICD-10-CM | POA: Diagnosis not present

## 2017-06-21 DIAGNOSIS — E785 Hyperlipidemia, unspecified: Secondary | ICD-10-CM | POA: Diagnosis not present

## 2017-06-22 DIAGNOSIS — I5033 Acute on chronic diastolic (congestive) heart failure: Secondary | ICD-10-CM | POA: Diagnosis not present

## 2017-06-22 DIAGNOSIS — I272 Pulmonary hypertension, unspecified: Secondary | ICD-10-CM | POA: Diagnosis not present

## 2017-06-22 DIAGNOSIS — N184 Chronic kidney disease, stage 4 (severe): Secondary | ICD-10-CM | POA: Diagnosis not present

## 2017-06-22 DIAGNOSIS — J9601 Acute respiratory failure with hypoxia: Secondary | ICD-10-CM | POA: Diagnosis not present

## 2017-06-22 DIAGNOSIS — I130 Hypertensive heart and chronic kidney disease with heart failure and stage 1 through stage 4 chronic kidney disease, or unspecified chronic kidney disease: Secondary | ICD-10-CM | POA: Diagnosis not present

## 2017-06-22 DIAGNOSIS — I482 Chronic atrial fibrillation: Secondary | ICD-10-CM | POA: Diagnosis not present

## 2017-06-23 DIAGNOSIS — I130 Hypertensive heart and chronic kidney disease with heart failure and stage 1 through stage 4 chronic kidney disease, or unspecified chronic kidney disease: Secondary | ICD-10-CM | POA: Diagnosis not present

## 2017-06-23 DIAGNOSIS — I482 Chronic atrial fibrillation: Secondary | ICD-10-CM | POA: Diagnosis not present

## 2017-06-23 DIAGNOSIS — J9601 Acute respiratory failure with hypoxia: Secondary | ICD-10-CM | POA: Diagnosis not present

## 2017-06-23 DIAGNOSIS — I5033 Acute on chronic diastolic (congestive) heart failure: Secondary | ICD-10-CM | POA: Diagnosis not present

## 2017-06-23 DIAGNOSIS — N184 Chronic kidney disease, stage 4 (severe): Secondary | ICD-10-CM | POA: Diagnosis not present

## 2017-06-23 DIAGNOSIS — I272 Pulmonary hypertension, unspecified: Secondary | ICD-10-CM | POA: Diagnosis not present

## 2017-06-26 DIAGNOSIS — I272 Pulmonary hypertension, unspecified: Secondary | ICD-10-CM | POA: Diagnosis not present

## 2017-06-26 DIAGNOSIS — I482 Chronic atrial fibrillation: Secondary | ICD-10-CM | POA: Diagnosis not present

## 2017-06-26 DIAGNOSIS — I130 Hypertensive heart and chronic kidney disease with heart failure and stage 1 through stage 4 chronic kidney disease, or unspecified chronic kidney disease: Secondary | ICD-10-CM | POA: Diagnosis not present

## 2017-06-26 DIAGNOSIS — I5033 Acute on chronic diastolic (congestive) heart failure: Secondary | ICD-10-CM | POA: Diagnosis not present

## 2017-06-26 DIAGNOSIS — J9601 Acute respiratory failure with hypoxia: Secondary | ICD-10-CM | POA: Diagnosis not present

## 2017-06-26 DIAGNOSIS — N184 Chronic kidney disease, stage 4 (severe): Secondary | ICD-10-CM | POA: Diagnosis not present

## 2017-06-27 DIAGNOSIS — I5033 Acute on chronic diastolic (congestive) heart failure: Secondary | ICD-10-CM | POA: Diagnosis not present

## 2017-06-27 DIAGNOSIS — J9601 Acute respiratory failure with hypoxia: Secondary | ICD-10-CM | POA: Diagnosis not present

## 2017-06-27 DIAGNOSIS — N184 Chronic kidney disease, stage 4 (severe): Secondary | ICD-10-CM | POA: Diagnosis not present

## 2017-06-27 DIAGNOSIS — I272 Pulmonary hypertension, unspecified: Secondary | ICD-10-CM | POA: Diagnosis not present

## 2017-06-27 DIAGNOSIS — I482 Chronic atrial fibrillation: Secondary | ICD-10-CM | POA: Diagnosis not present

## 2017-06-27 DIAGNOSIS — I130 Hypertensive heart and chronic kidney disease with heart failure and stage 1 through stage 4 chronic kidney disease, or unspecified chronic kidney disease: Secondary | ICD-10-CM | POA: Diagnosis not present

## 2017-06-28 DIAGNOSIS — I130 Hypertensive heart and chronic kidney disease with heart failure and stage 1 through stage 4 chronic kidney disease, or unspecified chronic kidney disease: Secondary | ICD-10-CM | POA: Diagnosis not present

## 2017-06-28 DIAGNOSIS — I272 Pulmonary hypertension, unspecified: Secondary | ICD-10-CM | POA: Diagnosis not present

## 2017-06-28 DIAGNOSIS — J9601 Acute respiratory failure with hypoxia: Secondary | ICD-10-CM | POA: Diagnosis not present

## 2017-06-28 DIAGNOSIS — I482 Chronic atrial fibrillation: Secondary | ICD-10-CM | POA: Diagnosis not present

## 2017-06-28 DIAGNOSIS — I5033 Acute on chronic diastolic (congestive) heart failure: Secondary | ICD-10-CM | POA: Diagnosis not present

## 2017-06-28 DIAGNOSIS — N184 Chronic kidney disease, stage 4 (severe): Secondary | ICD-10-CM | POA: Diagnosis not present

## 2017-06-29 DIAGNOSIS — I482 Chronic atrial fibrillation: Secondary | ICD-10-CM | POA: Diagnosis not present

## 2017-06-29 DIAGNOSIS — N184 Chronic kidney disease, stage 4 (severe): Secondary | ICD-10-CM | POA: Diagnosis not present

## 2017-06-29 DIAGNOSIS — I5033 Acute on chronic diastolic (congestive) heart failure: Secondary | ICD-10-CM | POA: Diagnosis not present

## 2017-06-29 DIAGNOSIS — J9601 Acute respiratory failure with hypoxia: Secondary | ICD-10-CM | POA: Diagnosis not present

## 2017-06-29 DIAGNOSIS — I272 Pulmonary hypertension, unspecified: Secondary | ICD-10-CM | POA: Diagnosis not present

## 2017-06-29 DIAGNOSIS — I130 Hypertensive heart and chronic kidney disease with heart failure and stage 1 through stage 4 chronic kidney disease, or unspecified chronic kidney disease: Secondary | ICD-10-CM | POA: Diagnosis not present

## 2017-07-03 DIAGNOSIS — I482 Chronic atrial fibrillation: Secondary | ICD-10-CM | POA: Diagnosis not present

## 2017-07-03 DIAGNOSIS — N184 Chronic kidney disease, stage 4 (severe): Secondary | ICD-10-CM | POA: Diagnosis not present

## 2017-07-03 DIAGNOSIS — I5033 Acute on chronic diastolic (congestive) heart failure: Secondary | ICD-10-CM | POA: Diagnosis not present

## 2017-07-03 DIAGNOSIS — I130 Hypertensive heart and chronic kidney disease with heart failure and stage 1 through stage 4 chronic kidney disease, or unspecified chronic kidney disease: Secondary | ICD-10-CM | POA: Diagnosis not present

## 2017-07-03 DIAGNOSIS — I272 Pulmonary hypertension, unspecified: Secondary | ICD-10-CM | POA: Diagnosis not present

## 2017-07-03 DIAGNOSIS — J9601 Acute respiratory failure with hypoxia: Secondary | ICD-10-CM | POA: Diagnosis not present

## 2017-07-04 DIAGNOSIS — I5033 Acute on chronic diastolic (congestive) heart failure: Secondary | ICD-10-CM | POA: Diagnosis not present

## 2017-07-04 DIAGNOSIS — I272 Pulmonary hypertension, unspecified: Secondary | ICD-10-CM | POA: Diagnosis not present

## 2017-07-04 DIAGNOSIS — J9601 Acute respiratory failure with hypoxia: Secondary | ICD-10-CM | POA: Diagnosis not present

## 2017-07-04 DIAGNOSIS — N184 Chronic kidney disease, stage 4 (severe): Secondary | ICD-10-CM | POA: Diagnosis not present

## 2017-07-04 DIAGNOSIS — I130 Hypertensive heart and chronic kidney disease with heart failure and stage 1 through stage 4 chronic kidney disease, or unspecified chronic kidney disease: Secondary | ICD-10-CM | POA: Diagnosis not present

## 2017-07-04 DIAGNOSIS — I482 Chronic atrial fibrillation: Secondary | ICD-10-CM | POA: Diagnosis not present

## 2017-07-05 DIAGNOSIS — I5033 Acute on chronic diastolic (congestive) heart failure: Secondary | ICD-10-CM | POA: Diagnosis not present

## 2017-07-05 DIAGNOSIS — J9601 Acute respiratory failure with hypoxia: Secondary | ICD-10-CM | POA: Diagnosis not present

## 2017-07-05 DIAGNOSIS — I130 Hypertensive heart and chronic kidney disease with heart failure and stage 1 through stage 4 chronic kidney disease, or unspecified chronic kidney disease: Secondary | ICD-10-CM | POA: Diagnosis not present

## 2017-07-05 DIAGNOSIS — I272 Pulmonary hypertension, unspecified: Secondary | ICD-10-CM | POA: Diagnosis not present

## 2017-07-05 DIAGNOSIS — I482 Chronic atrial fibrillation: Secondary | ICD-10-CM | POA: Diagnosis not present

## 2017-07-05 DIAGNOSIS — N184 Chronic kidney disease, stage 4 (severe): Secondary | ICD-10-CM | POA: Diagnosis not present

## 2017-07-06 DIAGNOSIS — I272 Pulmonary hypertension, unspecified: Secondary | ICD-10-CM | POA: Diagnosis not present

## 2017-07-06 DIAGNOSIS — I5033 Acute on chronic diastolic (congestive) heart failure: Secondary | ICD-10-CM | POA: Diagnosis not present

## 2017-07-06 DIAGNOSIS — N184 Chronic kidney disease, stage 4 (severe): Secondary | ICD-10-CM | POA: Diagnosis not present

## 2017-07-06 DIAGNOSIS — I482 Chronic atrial fibrillation: Secondary | ICD-10-CM | POA: Diagnosis not present

## 2017-07-06 DIAGNOSIS — I130 Hypertensive heart and chronic kidney disease with heart failure and stage 1 through stage 4 chronic kidney disease, or unspecified chronic kidney disease: Secondary | ICD-10-CM | POA: Diagnosis not present

## 2017-07-06 DIAGNOSIS — J9601 Acute respiratory failure with hypoxia: Secondary | ICD-10-CM | POA: Diagnosis not present

## 2017-07-07 DIAGNOSIS — I5033 Acute on chronic diastolic (congestive) heart failure: Secondary | ICD-10-CM | POA: Diagnosis not present

## 2017-07-07 DIAGNOSIS — I482 Chronic atrial fibrillation: Secondary | ICD-10-CM | POA: Diagnosis not present

## 2017-07-07 DIAGNOSIS — I272 Pulmonary hypertension, unspecified: Secondary | ICD-10-CM | POA: Diagnosis not present

## 2017-07-07 DIAGNOSIS — J9601 Acute respiratory failure with hypoxia: Secondary | ICD-10-CM | POA: Diagnosis not present

## 2017-07-07 DIAGNOSIS — N184 Chronic kidney disease, stage 4 (severe): Secondary | ICD-10-CM | POA: Diagnosis not present

## 2017-07-07 DIAGNOSIS — I130 Hypertensive heart and chronic kidney disease with heart failure and stage 1 through stage 4 chronic kidney disease, or unspecified chronic kidney disease: Secondary | ICD-10-CM | POA: Diagnosis not present

## 2017-07-08 DIAGNOSIS — N184 Chronic kidney disease, stage 4 (severe): Secondary | ICD-10-CM | POA: Diagnosis not present

## 2017-07-08 DIAGNOSIS — I130 Hypertensive heart and chronic kidney disease with heart failure and stage 1 through stage 4 chronic kidney disease, or unspecified chronic kidney disease: Secondary | ICD-10-CM | POA: Diagnosis not present

## 2017-07-08 DIAGNOSIS — I272 Pulmonary hypertension, unspecified: Secondary | ICD-10-CM | POA: Diagnosis not present

## 2017-07-08 DIAGNOSIS — I482 Chronic atrial fibrillation: Secondary | ICD-10-CM | POA: Diagnosis not present

## 2017-07-08 DIAGNOSIS — J9601 Acute respiratory failure with hypoxia: Secondary | ICD-10-CM | POA: Diagnosis not present

## 2017-07-08 DIAGNOSIS — I5033 Acute on chronic diastolic (congestive) heart failure: Secondary | ICD-10-CM | POA: Diagnosis not present

## 2017-07-10 DIAGNOSIS — I130 Hypertensive heart and chronic kidney disease with heart failure and stage 1 through stage 4 chronic kidney disease, or unspecified chronic kidney disease: Secondary | ICD-10-CM | POA: Diagnosis not present

## 2017-07-10 DIAGNOSIS — I482 Chronic atrial fibrillation: Secondary | ICD-10-CM | POA: Diagnosis not present

## 2017-07-10 DIAGNOSIS — N184 Chronic kidney disease, stage 4 (severe): Secondary | ICD-10-CM | POA: Diagnosis not present

## 2017-07-10 DIAGNOSIS — I5033 Acute on chronic diastolic (congestive) heart failure: Secondary | ICD-10-CM | POA: Diagnosis not present

## 2017-07-10 DIAGNOSIS — J9601 Acute respiratory failure with hypoxia: Secondary | ICD-10-CM | POA: Diagnosis not present

## 2017-07-10 DIAGNOSIS — I272 Pulmonary hypertension, unspecified: Secondary | ICD-10-CM | POA: Diagnosis not present

## 2017-07-11 DIAGNOSIS — I272 Pulmonary hypertension, unspecified: Secondary | ICD-10-CM | POA: Diagnosis not present

## 2017-07-11 DIAGNOSIS — I482 Chronic atrial fibrillation: Secondary | ICD-10-CM | POA: Diagnosis not present

## 2017-07-11 DIAGNOSIS — I5033 Acute on chronic diastolic (congestive) heart failure: Secondary | ICD-10-CM | POA: Diagnosis not present

## 2017-07-11 DIAGNOSIS — N184 Chronic kidney disease, stage 4 (severe): Secondary | ICD-10-CM | POA: Diagnosis not present

## 2017-07-11 DIAGNOSIS — I130 Hypertensive heart and chronic kidney disease with heart failure and stage 1 through stage 4 chronic kidney disease, or unspecified chronic kidney disease: Secondary | ICD-10-CM | POA: Diagnosis not present

## 2017-07-11 DIAGNOSIS — J9601 Acute respiratory failure with hypoxia: Secondary | ICD-10-CM | POA: Diagnosis not present

## 2017-07-12 DIAGNOSIS — I482 Chronic atrial fibrillation: Secondary | ICD-10-CM | POA: Diagnosis not present

## 2017-07-12 DIAGNOSIS — I130 Hypertensive heart and chronic kidney disease with heart failure and stage 1 through stage 4 chronic kidney disease, or unspecified chronic kidney disease: Secondary | ICD-10-CM | POA: Diagnosis not present

## 2017-07-12 DIAGNOSIS — I272 Pulmonary hypertension, unspecified: Secondary | ICD-10-CM | POA: Diagnosis not present

## 2017-07-12 DIAGNOSIS — I5033 Acute on chronic diastolic (congestive) heart failure: Secondary | ICD-10-CM | POA: Diagnosis not present

## 2017-07-12 DIAGNOSIS — N184 Chronic kidney disease, stage 4 (severe): Secondary | ICD-10-CM | POA: Diagnosis not present

## 2017-07-12 DIAGNOSIS — J9601 Acute respiratory failure with hypoxia: Secondary | ICD-10-CM | POA: Diagnosis not present

## 2017-07-13 DIAGNOSIS — I272 Pulmonary hypertension, unspecified: Secondary | ICD-10-CM | POA: Diagnosis not present

## 2017-07-13 DIAGNOSIS — I5033 Acute on chronic diastolic (congestive) heart failure: Secondary | ICD-10-CM | POA: Diagnosis not present

## 2017-07-13 DIAGNOSIS — N184 Chronic kidney disease, stage 4 (severe): Secondary | ICD-10-CM | POA: Diagnosis not present

## 2017-07-13 DIAGNOSIS — I130 Hypertensive heart and chronic kidney disease with heart failure and stage 1 through stage 4 chronic kidney disease, or unspecified chronic kidney disease: Secondary | ICD-10-CM | POA: Diagnosis not present

## 2017-07-13 DIAGNOSIS — I482 Chronic atrial fibrillation: Secondary | ICD-10-CM | POA: Diagnosis not present

## 2017-07-13 DIAGNOSIS — J9601 Acute respiratory failure with hypoxia: Secondary | ICD-10-CM | POA: Diagnosis not present

## 2017-07-14 DIAGNOSIS — I482 Chronic atrial fibrillation: Secondary | ICD-10-CM | POA: Diagnosis not present

## 2017-07-14 DIAGNOSIS — I130 Hypertensive heart and chronic kidney disease with heart failure and stage 1 through stage 4 chronic kidney disease, or unspecified chronic kidney disease: Secondary | ICD-10-CM | POA: Diagnosis not present

## 2017-07-14 DIAGNOSIS — J9601 Acute respiratory failure with hypoxia: Secondary | ICD-10-CM | POA: Diagnosis not present

## 2017-07-14 DIAGNOSIS — I5033 Acute on chronic diastolic (congestive) heart failure: Secondary | ICD-10-CM | POA: Diagnosis not present

## 2017-07-14 DIAGNOSIS — I272 Pulmonary hypertension, unspecified: Secondary | ICD-10-CM | POA: Diagnosis not present

## 2017-07-14 DIAGNOSIS — N184 Chronic kidney disease, stage 4 (severe): Secondary | ICD-10-CM | POA: Diagnosis not present

## 2017-07-17 DIAGNOSIS — I5033 Acute on chronic diastolic (congestive) heart failure: Secondary | ICD-10-CM | POA: Diagnosis not present

## 2017-07-17 DIAGNOSIS — J9601 Acute respiratory failure with hypoxia: Secondary | ICD-10-CM | POA: Diagnosis not present

## 2017-07-17 DIAGNOSIS — I482 Chronic atrial fibrillation: Secondary | ICD-10-CM | POA: Diagnosis not present

## 2017-07-17 DIAGNOSIS — I272 Pulmonary hypertension, unspecified: Secondary | ICD-10-CM | POA: Diagnosis not present

## 2017-07-17 DIAGNOSIS — I130 Hypertensive heart and chronic kidney disease with heart failure and stage 1 through stage 4 chronic kidney disease, or unspecified chronic kidney disease: Secondary | ICD-10-CM | POA: Diagnosis not present

## 2017-07-17 DIAGNOSIS — N184 Chronic kidney disease, stage 4 (severe): Secondary | ICD-10-CM | POA: Diagnosis not present

## 2017-07-18 DIAGNOSIS — I130 Hypertensive heart and chronic kidney disease with heart failure and stage 1 through stage 4 chronic kidney disease, or unspecified chronic kidney disease: Secondary | ICD-10-CM | POA: Diagnosis not present

## 2017-07-18 DIAGNOSIS — I272 Pulmonary hypertension, unspecified: Secondary | ICD-10-CM | POA: Diagnosis not present

## 2017-07-18 DIAGNOSIS — I5033 Acute on chronic diastolic (congestive) heart failure: Secondary | ICD-10-CM | POA: Diagnosis not present

## 2017-07-18 DIAGNOSIS — J9601 Acute respiratory failure with hypoxia: Secondary | ICD-10-CM | POA: Diagnosis not present

## 2017-07-18 DIAGNOSIS — N184 Chronic kidney disease, stage 4 (severe): Secondary | ICD-10-CM | POA: Diagnosis not present

## 2017-07-18 DIAGNOSIS — I482 Chronic atrial fibrillation: Secondary | ICD-10-CM | POA: Diagnosis not present

## 2017-07-19 DIAGNOSIS — I272 Pulmonary hypertension, unspecified: Secondary | ICD-10-CM | POA: Diagnosis not present

## 2017-07-19 DIAGNOSIS — J9601 Acute respiratory failure with hypoxia: Secondary | ICD-10-CM | POA: Diagnosis not present

## 2017-07-19 DIAGNOSIS — I130 Hypertensive heart and chronic kidney disease with heart failure and stage 1 through stage 4 chronic kidney disease, or unspecified chronic kidney disease: Secondary | ICD-10-CM | POA: Diagnosis not present

## 2017-07-19 DIAGNOSIS — N184 Chronic kidney disease, stage 4 (severe): Secondary | ICD-10-CM | POA: Diagnosis not present

## 2017-07-19 DIAGNOSIS — I5033 Acute on chronic diastolic (congestive) heart failure: Secondary | ICD-10-CM | POA: Diagnosis not present

## 2017-07-19 DIAGNOSIS — I482 Chronic atrial fibrillation: Secondary | ICD-10-CM | POA: Diagnosis not present

## 2017-07-20 DIAGNOSIS — I5033 Acute on chronic diastolic (congestive) heart failure: Secondary | ICD-10-CM | POA: Diagnosis not present

## 2017-07-20 DIAGNOSIS — I482 Chronic atrial fibrillation: Secondary | ICD-10-CM | POA: Diagnosis not present

## 2017-07-20 DIAGNOSIS — I272 Pulmonary hypertension, unspecified: Secondary | ICD-10-CM | POA: Diagnosis not present

## 2017-07-20 DIAGNOSIS — N184 Chronic kidney disease, stage 4 (severe): Secondary | ICD-10-CM | POA: Diagnosis not present

## 2017-07-20 DIAGNOSIS — I130 Hypertensive heart and chronic kidney disease with heart failure and stage 1 through stage 4 chronic kidney disease, or unspecified chronic kidney disease: Secondary | ICD-10-CM | POA: Diagnosis not present

## 2017-07-20 DIAGNOSIS — J9601 Acute respiratory failure with hypoxia: Secondary | ICD-10-CM | POA: Diagnosis not present

## 2017-07-21 DIAGNOSIS — N184 Chronic kidney disease, stage 4 (severe): Secondary | ICD-10-CM | POA: Diagnosis not present

## 2017-07-21 DIAGNOSIS — I482 Chronic atrial fibrillation: Secondary | ICD-10-CM | POA: Diagnosis not present

## 2017-07-21 DIAGNOSIS — J9601 Acute respiratory failure with hypoxia: Secondary | ICD-10-CM | POA: Diagnosis not present

## 2017-07-21 DIAGNOSIS — I5033 Acute on chronic diastolic (congestive) heart failure: Secondary | ICD-10-CM | POA: Diagnosis not present

## 2017-07-21 DIAGNOSIS — I130 Hypertensive heart and chronic kidney disease with heart failure and stage 1 through stage 4 chronic kidney disease, or unspecified chronic kidney disease: Secondary | ICD-10-CM | POA: Diagnosis not present

## 2017-07-21 DIAGNOSIS — I272 Pulmonary hypertension, unspecified: Secondary | ICD-10-CM | POA: Diagnosis not present

## 2017-07-22 DIAGNOSIS — J449 Chronic obstructive pulmonary disease, unspecified: Secondary | ICD-10-CM | POA: Diagnosis not present

## 2017-07-22 DIAGNOSIS — Z9981 Dependence on supplemental oxygen: Secondary | ICD-10-CM | POA: Diagnosis not present

## 2017-07-22 DIAGNOSIS — I482 Chronic atrial fibrillation: Secondary | ICD-10-CM | POA: Diagnosis not present

## 2017-07-22 DIAGNOSIS — N184 Chronic kidney disease, stage 4 (severe): Secondary | ICD-10-CM | POA: Diagnosis not present

## 2017-07-22 DIAGNOSIS — E785 Hyperlipidemia, unspecified: Secondary | ICD-10-CM | POA: Diagnosis not present

## 2017-07-22 DIAGNOSIS — I130 Hypertensive heart and chronic kidney disease with heart failure and stage 1 through stage 4 chronic kidney disease, or unspecified chronic kidney disease: Secondary | ICD-10-CM | POA: Diagnosis not present

## 2017-07-22 DIAGNOSIS — K219 Gastro-esophageal reflux disease without esophagitis: Secondary | ICD-10-CM | POA: Diagnosis not present

## 2017-07-22 DIAGNOSIS — S81811D Laceration without foreign body, right lower leg, subsequent encounter: Secondary | ICD-10-CM | POA: Diagnosis not present

## 2017-07-22 DIAGNOSIS — I5033 Acute on chronic diastolic (congestive) heart failure: Secondary | ICD-10-CM | POA: Diagnosis not present

## 2017-07-22 DIAGNOSIS — I471 Supraventricular tachycardia: Secondary | ICD-10-CM | POA: Diagnosis not present

## 2017-07-22 DIAGNOSIS — H3530 Unspecified macular degeneration: Secondary | ICD-10-CM | POA: Diagnosis not present

## 2017-07-22 DIAGNOSIS — I272 Pulmonary hypertension, unspecified: Secondary | ICD-10-CM | POA: Diagnosis not present

## 2017-07-22 DIAGNOSIS — J9601 Acute respiratory failure with hypoxia: Secondary | ICD-10-CM | POA: Diagnosis not present

## 2017-07-22 DIAGNOSIS — Z9181 History of falling: Secondary | ICD-10-CM | POA: Diagnosis not present

## 2017-07-22 DIAGNOSIS — E871 Hypo-osmolality and hyponatremia: Secondary | ICD-10-CM | POA: Diagnosis not present

## 2017-07-22 DIAGNOSIS — D649 Anemia, unspecified: Secondary | ICD-10-CM | POA: Diagnosis not present

## 2017-07-22 DIAGNOSIS — S8011XD Contusion of right lower leg, subsequent encounter: Secondary | ICD-10-CM | POA: Diagnosis not present

## 2017-07-23 DIAGNOSIS — I5033 Acute on chronic diastolic (congestive) heart failure: Secondary | ICD-10-CM | POA: Diagnosis not present

## 2017-07-23 DIAGNOSIS — I482 Chronic atrial fibrillation: Secondary | ICD-10-CM | POA: Diagnosis not present

## 2017-07-23 DIAGNOSIS — I272 Pulmonary hypertension, unspecified: Secondary | ICD-10-CM | POA: Diagnosis not present

## 2017-07-23 DIAGNOSIS — I130 Hypertensive heart and chronic kidney disease with heart failure and stage 1 through stage 4 chronic kidney disease, or unspecified chronic kidney disease: Secondary | ICD-10-CM | POA: Diagnosis not present

## 2017-07-23 DIAGNOSIS — N184 Chronic kidney disease, stage 4 (severe): Secondary | ICD-10-CM | POA: Diagnosis not present

## 2017-07-23 DIAGNOSIS — J9601 Acute respiratory failure with hypoxia: Secondary | ICD-10-CM | POA: Diagnosis not present

## 2017-07-24 DIAGNOSIS — I482 Chronic atrial fibrillation: Secondary | ICD-10-CM | POA: Diagnosis not present

## 2017-07-24 DIAGNOSIS — I272 Pulmonary hypertension, unspecified: Secondary | ICD-10-CM | POA: Diagnosis not present

## 2017-07-24 DIAGNOSIS — J9601 Acute respiratory failure with hypoxia: Secondary | ICD-10-CM | POA: Diagnosis not present

## 2017-07-24 DIAGNOSIS — N184 Chronic kidney disease, stage 4 (severe): Secondary | ICD-10-CM | POA: Diagnosis not present

## 2017-07-24 DIAGNOSIS — I130 Hypertensive heart and chronic kidney disease with heart failure and stage 1 through stage 4 chronic kidney disease, or unspecified chronic kidney disease: Secondary | ICD-10-CM | POA: Diagnosis not present

## 2017-07-24 DIAGNOSIS — I5033 Acute on chronic diastolic (congestive) heart failure: Secondary | ICD-10-CM | POA: Diagnosis not present

## 2017-07-25 DIAGNOSIS — I482 Chronic atrial fibrillation: Secondary | ICD-10-CM | POA: Diagnosis not present

## 2017-07-25 DIAGNOSIS — I130 Hypertensive heart and chronic kidney disease with heart failure and stage 1 through stage 4 chronic kidney disease, or unspecified chronic kidney disease: Secondary | ICD-10-CM | POA: Diagnosis not present

## 2017-07-25 DIAGNOSIS — I5033 Acute on chronic diastolic (congestive) heart failure: Secondary | ICD-10-CM | POA: Diagnosis not present

## 2017-07-25 DIAGNOSIS — N184 Chronic kidney disease, stage 4 (severe): Secondary | ICD-10-CM | POA: Diagnosis not present

## 2017-07-25 DIAGNOSIS — I272 Pulmonary hypertension, unspecified: Secondary | ICD-10-CM | POA: Diagnosis not present

## 2017-07-25 DIAGNOSIS — J9601 Acute respiratory failure with hypoxia: Secondary | ICD-10-CM | POA: Diagnosis not present

## 2017-07-26 DIAGNOSIS — I130 Hypertensive heart and chronic kidney disease with heart failure and stage 1 through stage 4 chronic kidney disease, or unspecified chronic kidney disease: Secondary | ICD-10-CM | POA: Diagnosis not present

## 2017-07-26 DIAGNOSIS — I482 Chronic atrial fibrillation: Secondary | ICD-10-CM | POA: Diagnosis not present

## 2017-07-26 DIAGNOSIS — J9601 Acute respiratory failure with hypoxia: Secondary | ICD-10-CM | POA: Diagnosis not present

## 2017-07-26 DIAGNOSIS — N184 Chronic kidney disease, stage 4 (severe): Secondary | ICD-10-CM | POA: Diagnosis not present

## 2017-07-26 DIAGNOSIS — I272 Pulmonary hypertension, unspecified: Secondary | ICD-10-CM | POA: Diagnosis not present

## 2017-07-26 DIAGNOSIS — I5033 Acute on chronic diastolic (congestive) heart failure: Secondary | ICD-10-CM | POA: Diagnosis not present

## 2017-07-28 DIAGNOSIS — I272 Pulmonary hypertension, unspecified: Secondary | ICD-10-CM | POA: Diagnosis not present

## 2017-07-28 DIAGNOSIS — N184 Chronic kidney disease, stage 4 (severe): Secondary | ICD-10-CM | POA: Diagnosis not present

## 2017-07-28 DIAGNOSIS — J9601 Acute respiratory failure with hypoxia: Secondary | ICD-10-CM | POA: Diagnosis not present

## 2017-07-28 DIAGNOSIS — I5033 Acute on chronic diastolic (congestive) heart failure: Secondary | ICD-10-CM | POA: Diagnosis not present

## 2017-07-28 DIAGNOSIS — I482 Chronic atrial fibrillation: Secondary | ICD-10-CM | POA: Diagnosis not present

## 2017-07-28 DIAGNOSIS — I130 Hypertensive heart and chronic kidney disease with heart failure and stage 1 through stage 4 chronic kidney disease, or unspecified chronic kidney disease: Secondary | ICD-10-CM | POA: Diagnosis not present

## 2017-08-01 DIAGNOSIS — I482 Chronic atrial fibrillation: Secondary | ICD-10-CM | POA: Diagnosis not present

## 2017-08-01 DIAGNOSIS — N184 Chronic kidney disease, stage 4 (severe): Secondary | ICD-10-CM | POA: Diagnosis not present

## 2017-08-01 DIAGNOSIS — I272 Pulmonary hypertension, unspecified: Secondary | ICD-10-CM | POA: Diagnosis not present

## 2017-08-01 DIAGNOSIS — I5033 Acute on chronic diastolic (congestive) heart failure: Secondary | ICD-10-CM | POA: Diagnosis not present

## 2017-08-01 DIAGNOSIS — I130 Hypertensive heart and chronic kidney disease with heart failure and stage 1 through stage 4 chronic kidney disease, or unspecified chronic kidney disease: Secondary | ICD-10-CM | POA: Diagnosis not present

## 2017-08-01 DIAGNOSIS — J9601 Acute respiratory failure with hypoxia: Secondary | ICD-10-CM | POA: Diagnosis not present

## 2017-08-02 DIAGNOSIS — J9601 Acute respiratory failure with hypoxia: Secondary | ICD-10-CM | POA: Diagnosis not present

## 2017-08-02 DIAGNOSIS — I272 Pulmonary hypertension, unspecified: Secondary | ICD-10-CM | POA: Diagnosis not present

## 2017-08-02 DIAGNOSIS — N184 Chronic kidney disease, stage 4 (severe): Secondary | ICD-10-CM | POA: Diagnosis not present

## 2017-08-02 DIAGNOSIS — I5033 Acute on chronic diastolic (congestive) heart failure: Secondary | ICD-10-CM | POA: Diagnosis not present

## 2017-08-02 DIAGNOSIS — I130 Hypertensive heart and chronic kidney disease with heart failure and stage 1 through stage 4 chronic kidney disease, or unspecified chronic kidney disease: Secondary | ICD-10-CM | POA: Diagnosis not present

## 2017-08-02 DIAGNOSIS — I482 Chronic atrial fibrillation: Secondary | ICD-10-CM | POA: Diagnosis not present

## 2017-08-04 DIAGNOSIS — I130 Hypertensive heart and chronic kidney disease with heart failure and stage 1 through stage 4 chronic kidney disease, or unspecified chronic kidney disease: Secondary | ICD-10-CM | POA: Diagnosis not present

## 2017-08-04 DIAGNOSIS — N184 Chronic kidney disease, stage 4 (severe): Secondary | ICD-10-CM | POA: Diagnosis not present

## 2017-08-04 DIAGNOSIS — I5033 Acute on chronic diastolic (congestive) heart failure: Secondary | ICD-10-CM | POA: Diagnosis not present

## 2017-08-04 DIAGNOSIS — I482 Chronic atrial fibrillation: Secondary | ICD-10-CM | POA: Diagnosis not present

## 2017-08-04 DIAGNOSIS — I272 Pulmonary hypertension, unspecified: Secondary | ICD-10-CM | POA: Diagnosis not present

## 2017-08-04 DIAGNOSIS — J9601 Acute respiratory failure with hypoxia: Secondary | ICD-10-CM | POA: Diagnosis not present

## 2017-08-05 DIAGNOSIS — I5033 Acute on chronic diastolic (congestive) heart failure: Secondary | ICD-10-CM | POA: Diagnosis not present

## 2017-08-05 DIAGNOSIS — I272 Pulmonary hypertension, unspecified: Secondary | ICD-10-CM | POA: Diagnosis not present

## 2017-08-05 DIAGNOSIS — I130 Hypertensive heart and chronic kidney disease with heart failure and stage 1 through stage 4 chronic kidney disease, or unspecified chronic kidney disease: Secondary | ICD-10-CM | POA: Diagnosis not present

## 2017-08-05 DIAGNOSIS — J9601 Acute respiratory failure with hypoxia: Secondary | ICD-10-CM | POA: Diagnosis not present

## 2017-08-05 DIAGNOSIS — I482 Chronic atrial fibrillation: Secondary | ICD-10-CM | POA: Diagnosis not present

## 2017-08-05 DIAGNOSIS — N184 Chronic kidney disease, stage 4 (severe): Secondary | ICD-10-CM | POA: Diagnosis not present

## 2017-08-07 DIAGNOSIS — I5033 Acute on chronic diastolic (congestive) heart failure: Secondary | ICD-10-CM | POA: Diagnosis not present

## 2017-08-07 DIAGNOSIS — I272 Pulmonary hypertension, unspecified: Secondary | ICD-10-CM | POA: Diagnosis not present

## 2017-08-07 DIAGNOSIS — J9601 Acute respiratory failure with hypoxia: Secondary | ICD-10-CM | POA: Diagnosis not present

## 2017-08-07 DIAGNOSIS — I130 Hypertensive heart and chronic kidney disease with heart failure and stage 1 through stage 4 chronic kidney disease, or unspecified chronic kidney disease: Secondary | ICD-10-CM | POA: Diagnosis not present

## 2017-08-07 DIAGNOSIS — I482 Chronic atrial fibrillation: Secondary | ICD-10-CM | POA: Diagnosis not present

## 2017-08-07 DIAGNOSIS — N184 Chronic kidney disease, stage 4 (severe): Secondary | ICD-10-CM | POA: Diagnosis not present

## 2017-08-08 DIAGNOSIS — I130 Hypertensive heart and chronic kidney disease with heart failure and stage 1 through stage 4 chronic kidney disease, or unspecified chronic kidney disease: Secondary | ICD-10-CM | POA: Diagnosis not present

## 2017-08-08 DIAGNOSIS — J9601 Acute respiratory failure with hypoxia: Secondary | ICD-10-CM | POA: Diagnosis not present

## 2017-08-08 DIAGNOSIS — N184 Chronic kidney disease, stage 4 (severe): Secondary | ICD-10-CM | POA: Diagnosis not present

## 2017-08-08 DIAGNOSIS — I272 Pulmonary hypertension, unspecified: Secondary | ICD-10-CM | POA: Diagnosis not present

## 2017-08-08 DIAGNOSIS — I482 Chronic atrial fibrillation: Secondary | ICD-10-CM | POA: Diagnosis not present

## 2017-08-08 DIAGNOSIS — I5033 Acute on chronic diastolic (congestive) heart failure: Secondary | ICD-10-CM | POA: Diagnosis not present

## 2017-08-09 DIAGNOSIS — I130 Hypertensive heart and chronic kidney disease with heart failure and stage 1 through stage 4 chronic kidney disease, or unspecified chronic kidney disease: Secondary | ICD-10-CM | POA: Diagnosis not present

## 2017-08-09 DIAGNOSIS — I272 Pulmonary hypertension, unspecified: Secondary | ICD-10-CM | POA: Diagnosis not present

## 2017-08-09 DIAGNOSIS — J9601 Acute respiratory failure with hypoxia: Secondary | ICD-10-CM | POA: Diagnosis not present

## 2017-08-09 DIAGNOSIS — I5033 Acute on chronic diastolic (congestive) heart failure: Secondary | ICD-10-CM | POA: Diagnosis not present

## 2017-08-09 DIAGNOSIS — I482 Chronic atrial fibrillation: Secondary | ICD-10-CM | POA: Diagnosis not present

## 2017-08-09 DIAGNOSIS — N184 Chronic kidney disease, stage 4 (severe): Secondary | ICD-10-CM | POA: Diagnosis not present

## 2017-08-11 DIAGNOSIS — I5033 Acute on chronic diastolic (congestive) heart failure: Secondary | ICD-10-CM | POA: Diagnosis not present

## 2017-08-11 DIAGNOSIS — N184 Chronic kidney disease, stage 4 (severe): Secondary | ICD-10-CM | POA: Diagnosis not present

## 2017-08-11 DIAGNOSIS — I482 Chronic atrial fibrillation: Secondary | ICD-10-CM | POA: Diagnosis not present

## 2017-08-11 DIAGNOSIS — J9601 Acute respiratory failure with hypoxia: Secondary | ICD-10-CM | POA: Diagnosis not present

## 2017-08-11 DIAGNOSIS — I130 Hypertensive heart and chronic kidney disease with heart failure and stage 1 through stage 4 chronic kidney disease, or unspecified chronic kidney disease: Secondary | ICD-10-CM | POA: Diagnosis not present

## 2017-08-11 DIAGNOSIS — I272 Pulmonary hypertension, unspecified: Secondary | ICD-10-CM | POA: Diagnosis not present

## 2017-08-12 DIAGNOSIS — I482 Chronic atrial fibrillation: Secondary | ICD-10-CM | POA: Diagnosis not present

## 2017-08-12 DIAGNOSIS — I130 Hypertensive heart and chronic kidney disease with heart failure and stage 1 through stage 4 chronic kidney disease, or unspecified chronic kidney disease: Secondary | ICD-10-CM | POA: Diagnosis not present

## 2017-08-12 DIAGNOSIS — I5033 Acute on chronic diastolic (congestive) heart failure: Secondary | ICD-10-CM | POA: Diagnosis not present

## 2017-08-12 DIAGNOSIS — J9601 Acute respiratory failure with hypoxia: Secondary | ICD-10-CM | POA: Diagnosis not present

## 2017-08-12 DIAGNOSIS — N184 Chronic kidney disease, stage 4 (severe): Secondary | ICD-10-CM | POA: Diagnosis not present

## 2017-08-12 DIAGNOSIS — I272 Pulmonary hypertension, unspecified: Secondary | ICD-10-CM | POA: Diagnosis not present

## 2017-08-13 DIAGNOSIS — I5033 Acute on chronic diastolic (congestive) heart failure: Secondary | ICD-10-CM | POA: Diagnosis not present

## 2017-08-13 DIAGNOSIS — J9601 Acute respiratory failure with hypoxia: Secondary | ICD-10-CM | POA: Diagnosis not present

## 2017-08-13 DIAGNOSIS — I482 Chronic atrial fibrillation: Secondary | ICD-10-CM | POA: Diagnosis not present

## 2017-08-13 DIAGNOSIS — I272 Pulmonary hypertension, unspecified: Secondary | ICD-10-CM | POA: Diagnosis not present

## 2017-08-13 DIAGNOSIS — I130 Hypertensive heart and chronic kidney disease with heart failure and stage 1 through stage 4 chronic kidney disease, or unspecified chronic kidney disease: Secondary | ICD-10-CM | POA: Diagnosis not present

## 2017-08-13 DIAGNOSIS — N184 Chronic kidney disease, stage 4 (severe): Secondary | ICD-10-CM | POA: Diagnosis not present

## 2017-08-21 DEATH — deceased

## 2017-10-26 ENCOUNTER — Ambulatory Visit: Payer: Medicare Other | Admitting: Family

## 2017-10-26 NOTE — Progress Notes (Deleted)
Patient ID: Laura [name], female    DOB: July 17, 1924, 83 y.o.   MRN: [id number]  HPI Ms Laura [name] is a 83 y/o female with a history of macular degeneration, anemia, hyponatremia, SVT, GERD, GI bleed, paroxymal atrial fibrillation, CKD, hyperlipidemia, HTN and chronic heart failure.  Reviewed echo report from 04/09/17 which showed an EF of 60-65%. Previous echo was done 02/20/17 and showed mild/mod MR. No EF reported but cardiologist said it was systolic in nature.   Admitted 04/08/17 due to HF exacerbation. Initially given IV diuretics. Cardiology consult obtained. Given antibiotics and prednisone due to pneumonia. Discharged after 2 days. Admitted 03/17/17 due to hyponatremia and confusion. Treated and discharged after 4 days. Admitted 02/19/17 with HF exacerbation and atrial fibrillation. Cardiology consult obtained and medications were adjusted. Hyponatremia prompted fluid restriction of 1200 cc free water daily. Was in the ED 01/09/17 for SVT. Treated and released.   She presents today for a follow-up visit with a chief complaint of mild shortness of breath with moderate exertion. She says that this has been present for months and occurs when she has to walk long distances especially when she's walking outside. Resolves quickly at rest. Denies any associated symptoms such as fatigue, pedal edema or weight gain.   Past Medical History:  Diagnosis Date  . Anemia   . Arrhythmia    atrial fibrillation  . Atrial fibrillation (HCC)   . CHF (congestive heart failure) (HCC)   . Chronic kidney disease   . GERD (gastroesophageal reflux disease)   . GI bleed   . High cholesterol   . Hypertension   . Hyponatremia   . Macular degeneration   . SVT (supraventricular tachycardia) (HCC)    Past Surgical History:  Procedure Laterality Date  . ABDOMINAL HYSTERECTOMY    . JOINT REPLACEMENT    . REPLACEMENT TOTAL KNEE BILATERAL    . VAGINAL HYSTERECTOMY     Partial   Family History  Problem Relation  Age of Onset  . Hypertension Mother   . CAD Father   . Heart disease Father   . Arthritis Father   . Pancreatic cancer Son   . Heart failure Sister   . Diabetes Brother   . Heart failure Sister   . Diabetes Son    Social History   Tobacco Use  . Smoking status: Never Smoker  . Smokeless tobacco: Never Used  Substance Use Topics  . Alcohol use: No   Allergies  Allergen Reactions  . Penicillins Shortness Of Breath        . Codeine Itching  . Amlodipine Rash    Peripheral edema  . Ciprofloxacin Rash  . Sulfa Antibiotics Rash   Prior to Admission medications   Medication Sig Start Date End Date Taking? Authorizing Provider  acetaminophen (TYLENOL) 500 MG tablet Take 1,000 mg by mouth daily. Take 2 tabs (1000mg ) by mouth every morning.  Take 2 tablets in evening if needed.   Yes Historical Provider, MD  aspirin EC 81 MG tablet Take 81 mg by mouth daily.   Yes Historical Provider, MD  carvedilol (COREG) 25 MG tablet Take 1 tablet (25 mg total) by mouth 2 (two) times daily with a meal. 02/27/17  Yes Sital Mody, MD  diltiazem (CARDIZEM CD) 240 MG 24 hr capsule Take 1 capsule (240 mg total) by mouth daily. 02/28/17  Yes Adrian SaranSital Mody, MD  docusate sodium (COLACE) 100 MG capsule Take 100 mg by mouth 2 (two) times daily. 01/02/17  Yes Historical Provider,  MD  Fe Fum-Vit C-Vit B12-FA (TRIGELS-F FORTE) 460-60-0.01-1 MG CAPS capsule Take 1 tablet by mouth daily.   Yes Historical Provider, MD  Ferrous Gluconate 324 (37.5 Fe) MG TABS Take 1 tablet by mouth 2 (two) times daily. 11/15/16  Yes Historical Provider, MD  fluticasone (FLONASE) 50 MCG/ACT nasal spray Place 2 sprays into both nostrils daily.   Yes Historical Provider, MD  hydrALAZINE (APRESOLINE) 100 MG tablet Take 100 mg by mouth 2 (two) times daily. 12/23/16  Yes Historical Provider, MD  isosorbide mononitrate (IMDUR) 120 MG 24 hr tablet Take 1 tablet by mouth daily. 12/23/16  Yes Historical Provider, MD  lisinopril (PRINIVIL,ZESTRIL) 10 MG  tablet Take 10 mg by mouth daily. 11/15/16  Yes Historical Provider, MD  Lutein 20 MG TABS Take 1 tablet by mouth daily.   Yes Historical Provider, MD  pantoprazole (PROTONIX) 40 MG tablet Take 40 mg by mouth daily. 12/23/16  Yes Historical Provider, MD  simvastatin (ZOCOR) 40 MG tablet Take 40 mg by mouth daily. 12/23/16  Yes Historical Provider, MD  torsemide (DEMADEX) 20 MG tablet Take 20 mg by mouth daily.    Yes Historical Provider, MD  traMADol (ULTRAM) 50 MG tablet Take 50 mg by mouth every 6 (six) hours as needed for pain.   Yes Historical Provider, MD   Review of Systems  Constitutional: Negative for appetite change and fatigue.  HENT: Negative for congestion, postnasal drip and sore throat.   Eyes: Positive for visual disturbance. Negative for pain.  Respiratory: Positive for shortness of breath. Negative for cough and chest tightness.   Cardiovascular: Negative for chest pain, palpitations and leg swelling.  Gastrointestinal: Negative for abdominal distention and abdominal pain.  Endocrine: Negative.   Genitourinary: Negative.   Musculoskeletal: Negative for arthralgias and back pain.  Skin: Negative.   Allergic/Immunologic: Negative.   Neurological: Negative for dizziness and light-headedness.  Hematological: Negative for adenopathy. Does not bruise/bleed easily.  Psychiatric/Behavioral: Negative for dysphoric mood and sleep disturbance (sleeping on 2 pillows). The patient is not nervous/anxious.    There were no vitals filed for this visit. Wt Readings from Last 3 Encounters:  05/10/17 122 lb 14.4 oz (55.7 kg)  04/19/17 122 lb 4 oz (55.5 kg)  04/10/17 121 lb 12.8 oz (55.2 kg)   Lab Results  Component Value Date   CREATININE 2.27 (H) 05/10/2017   CREATININE 2.31 (H) 05/09/2017   CREATININE 1.99 (H) 05/08/2017   Physical Exam  Constitutional: She is oriented to person, place, and time. She appears well-developed and well-nourished.  HENT:  Head: Normocephalic and  atraumatic.  Eyes: Conjunctivae are normal.  Neck: Normal range of motion. Neck supple. No JVD present.  Cardiovascular: Normal rate. An irregular rhythm present.  Pulmonary/Chest: Effort normal. She has no wheezes. She has no rales.  Abdominal: Soft. She exhibits no distension. There is no tenderness.  Musculoskeletal: She exhibits no edema or tenderness.  Neurological: She is alert and oriented to person, place, and time.  Skin: Skin is warm and dry.  Psychiatric: She has a normal mood and affect. Her behavior is normal.  Nursing note and vitals reviewed.    Assessment & Plan:  1: Chronic heart failure with preserved ejection fraction- - NYHA class II - euvolemic today - weighing most days and says that her weight has been stable. Did have one time of an overnight weight gain of >2 pounds and she was given an extra torsemide with reduction in weight the following day. Reminded to call for  an overnight weight gain of >2 pounds or a weekly weight gain of >5 pounds.  - not adding extra salt. [facility] [place] [facility] but she says that the food doesn't taste salty - saw cardiologist ([place]) 4/1Juliann Pares2/18  2: Atrial fibrillation- - taking aspirin, carvedilol, diltiazem at this time - no other anticoagulation at this time due to history of GI bleed along with age  98: HTN- - BP looks good today -continue hydralazine, lisinopril and torsemide - saw PCP Cordelia Poche([place]) 04/13/17  4: Hyponatremia- -BMP done 03/17/17 reviewed and shows a sodium level of 124, potassium 4.5 and GFR 28. -continue 1200 cc free water fluid restriction  Patient did not bring her medications nor a list. Each medication was verbally reviewed with the patient and she was encouraged to bring the bottles to every visit to confirm accuracy of list.  Return here in 6 months or sooner for any questions/problems before then.
